# Patient Record
Sex: Female | Born: 1955 | Race: Black or African American | Hispanic: No | Smoking: Never smoker
Health system: Southern US, Community
[De-identification: ages and names within clinical notes are randomized; demographics above are authoritative.]

## PROBLEM LIST (undated history)

## (undated) DIAGNOSIS — R42 Dizziness and giddiness: Secondary | ICD-10-CM

## (undated) DIAGNOSIS — R51 Headache: Secondary | ICD-10-CM

## (undated) DIAGNOSIS — I639 Cerebral infarction, unspecified: Secondary | ICD-10-CM

## (undated) DIAGNOSIS — I10 Essential (primary) hypertension: Secondary | ICD-10-CM

## (undated) DIAGNOSIS — R519 Headache, unspecified: Secondary | ICD-10-CM

## (undated) DIAGNOSIS — M797 Fibromyalgia: Secondary | ICD-10-CM

## (undated) DIAGNOSIS — N200 Calculus of kidney: Secondary | ICD-10-CM

## (undated) DIAGNOSIS — G459 Transient cerebral ischemic attack, unspecified: Secondary | ICD-10-CM

## (undated) HISTORY — PX: KNEE ARTHROSCOPY: SUR90

## (undated) HISTORY — PX: ABDOMINAL HYSTERECTOMY: SHX81

---

## 2000-12-14 ENCOUNTER — Ambulatory Visit (HOSPITAL_COMMUNITY): Admission: RE | Admit: 2000-12-14 | Discharge: 2000-12-14 | Payer: Self-pay | Admitting: Obstetrics and Gynecology

## 2000-12-14 ENCOUNTER — Encounter: Payer: Self-pay | Admitting: Obstetrics and Gynecology

## 2001-05-18 ENCOUNTER — Encounter: Payer: Self-pay | Admitting: Obstetrics and Gynecology

## 2001-05-18 ENCOUNTER — Ambulatory Visit (HOSPITAL_COMMUNITY): Admission: RE | Admit: 2001-05-18 | Discharge: 2001-05-18 | Payer: Self-pay | Admitting: Obstetrics and Gynecology

## 2001-12-22 ENCOUNTER — Encounter: Payer: Self-pay | Admitting: Obstetrics and Gynecology

## 2001-12-22 ENCOUNTER — Ambulatory Visit (HOSPITAL_COMMUNITY): Admission: RE | Admit: 2001-12-22 | Discharge: 2001-12-22 | Payer: Self-pay | Admitting: Internal Medicine

## 2004-01-04 ENCOUNTER — Emergency Department (HOSPITAL_COMMUNITY): Admission: EM | Admit: 2004-01-04 | Discharge: 2004-01-04 | Payer: Self-pay | Admitting: Emergency Medicine

## 2004-04-18 ENCOUNTER — Ambulatory Visit (HOSPITAL_COMMUNITY): Admission: RE | Admit: 2004-04-18 | Discharge: 2004-04-18 | Payer: Self-pay | Admitting: Obstetrics and Gynecology

## 2005-04-20 ENCOUNTER — Ambulatory Visit (HOSPITAL_COMMUNITY): Admission: RE | Admit: 2005-04-20 | Discharge: 2005-04-20 | Payer: Self-pay | Admitting: Obstetrics and Gynecology

## 2005-05-21 ENCOUNTER — Emergency Department (HOSPITAL_COMMUNITY): Admission: EM | Admit: 2005-05-21 | Discharge: 2005-05-21 | Payer: Self-pay | Admitting: Emergency Medicine

## 2006-01-04 DIAGNOSIS — IMO0002 Reserved for concepts with insufficient information to code with codable children: Secondary | ICD-10-CM | POA: Insufficient documentation

## 2006-01-04 DIAGNOSIS — E669 Obesity, unspecified: Secondary | ICD-10-CM | POA: Insufficient documentation

## 2006-01-04 DIAGNOSIS — M51379 Other intervertebral disc degeneration, lumbosacral region without mention of lumbar back pain or lower extremity pain: Secondary | ICD-10-CM | POA: Insufficient documentation

## 2006-05-05 ENCOUNTER — Ambulatory Visit (HOSPITAL_COMMUNITY): Admission: RE | Admit: 2006-05-05 | Discharge: 2006-05-05 | Payer: Self-pay | Admitting: Obstetrics and Gynecology

## 2007-04-14 ENCOUNTER — Emergency Department (HOSPITAL_COMMUNITY): Admission: EM | Admit: 2007-04-14 | Discharge: 2007-04-14 | Payer: Self-pay | Admitting: Emergency Medicine

## 2007-05-23 ENCOUNTER — Ambulatory Visit (HOSPITAL_COMMUNITY): Admission: RE | Admit: 2007-05-23 | Discharge: 2007-05-23 | Payer: Self-pay | Admitting: Obstetrics and Gynecology

## 2007-06-05 ENCOUNTER — Emergency Department (HOSPITAL_COMMUNITY): Admission: EM | Admit: 2007-06-05 | Discharge: 2007-06-05 | Payer: Self-pay | Admitting: Emergency Medicine

## 2008-05-09 ENCOUNTER — Ambulatory Visit: Payer: Self-pay | Admitting: Internal Medicine

## 2008-05-23 ENCOUNTER — Ambulatory Visit (HOSPITAL_COMMUNITY): Admission: RE | Admit: 2008-05-23 | Discharge: 2008-05-23 | Payer: Self-pay | Admitting: Obstetrics and Gynecology

## 2008-05-28 ENCOUNTER — Encounter: Payer: Self-pay | Admitting: Internal Medicine

## 2008-05-28 ENCOUNTER — Ambulatory Visit (HOSPITAL_COMMUNITY): Admission: RE | Admit: 2008-05-28 | Discharge: 2008-05-28 | Payer: Self-pay | Admitting: Internal Medicine

## 2008-05-28 ENCOUNTER — Ambulatory Visit: Payer: Self-pay | Admitting: Internal Medicine

## 2008-10-30 ENCOUNTER — Encounter: Payer: Self-pay | Admitting: Orthopaedic Surgery

## 2008-11-01 ENCOUNTER — Encounter: Payer: Self-pay | Admitting: Orthopaedic Surgery

## 2008-12-01 ENCOUNTER — Encounter: Payer: Self-pay | Admitting: Orthopaedic Surgery

## 2009-05-24 ENCOUNTER — Ambulatory Visit (HOSPITAL_COMMUNITY): Admission: RE | Admit: 2009-05-24 | Discharge: 2009-05-24 | Payer: Self-pay | Admitting: Family Medicine

## 2009-11-30 ENCOUNTER — Emergency Department (HOSPITAL_COMMUNITY): Admission: EM | Admit: 2009-11-30 | Discharge: 2009-11-30 | Payer: Self-pay | Admitting: Emergency Medicine

## 2009-12-05 DIAGNOSIS — S838X9A Sprain of other specified parts of unspecified knee, initial encounter: Secondary | ICD-10-CM | POA: Insufficient documentation

## 2010-03-08 ENCOUNTER — Emergency Department (HOSPITAL_COMMUNITY): Admission: EM | Admit: 2010-03-08 | Discharge: 2010-03-08 | Payer: Self-pay | Admitting: Emergency Medicine

## 2010-03-24 ENCOUNTER — Ambulatory Visit (HOSPITAL_COMMUNITY): Admission: RE | Admit: 2010-03-24 | Discharge: 2010-03-24 | Payer: Self-pay | Admitting: Family Medicine

## 2010-03-30 ENCOUNTER — Emergency Department (HOSPITAL_COMMUNITY): Admission: EM | Admit: 2010-03-30 | Discharge: 2010-03-30 | Payer: Self-pay | Admitting: Emergency Medicine

## 2010-04-16 ENCOUNTER — Ambulatory Visit: Payer: Self-pay | Admitting: General Practice

## 2010-05-16 ENCOUNTER — Emergency Department (HOSPITAL_COMMUNITY): Admission: EM | Admit: 2010-05-16 | Discharge: 2010-05-16 | Payer: Self-pay | Admitting: Emergency Medicine

## 2010-06-05 ENCOUNTER — Ambulatory Visit (HOSPITAL_COMMUNITY): Admission: RE | Admit: 2010-06-05 | Discharge: 2010-06-05 | Payer: Self-pay | Admitting: Obstetrics and Gynecology

## 2010-10-15 LAB — COMPREHENSIVE METABOLIC PANEL
ALT: 22 U/L (ref 0–35)
Alkaline Phosphatase: 102 U/L (ref 39–117)
CO2: 28 mEq/L (ref 19–32)
Calcium: 9.3 mg/dL (ref 8.4–10.5)
GFR calc non Af Amer: >60 mL/min (ref >60)
Glucose, Bld: 93 mg/dL (ref 70–99)
Potassium: 3.8 mEq/L (ref 3.5–5.1)
Sodium: 140 mEq/L (ref 135–145)

## 2010-10-15 LAB — DIFFERENTIAL
Basophils Relative: 1 % (ref 0–1)
Eosinophils Absolute: 0.1 10*3/uL (ref 0.0–0.7)
Neutrophils Relative %: 72 % (ref 43–77)

## 2010-10-15 LAB — URINALYSIS, ROUTINE W REFLEX MICROSCOPIC
Glucose, UA: NEGATIVE mg/dL
Hgb urine dipstick: NEGATIVE
Ketones, ur: NEGATIVE mg/dL
pH: 7 (ref 5.0–8.0)

## 2010-10-15 LAB — CBC
HCT: 39.3 % (ref 36.0–46.0)
Hemoglobin: 12.9 g/dL (ref 12.0–15.0)
MCHC: 33 g/dL (ref 30.0–36.0)

## 2010-10-16 LAB — CBC
HCT: 38.2 % (ref 36.0–46.0)
MCHC: 32.8 g/dL (ref 30.0–36.0)
MCV: 79.1 fL (ref 78.0–100.0)
RDW: 15.3 % (ref 11.5–15.5)
WBC: 7.8 10*3/uL (ref 4.0–10.5)

## 2010-10-16 LAB — COMPREHENSIVE METABOLIC PANEL
Alkaline Phosphatase: 102 U/L (ref 39–117)
BUN: 8 mg/dL (ref 6–23)
GFR calc non Af Amer: >60 mL/min (ref >60)
Glucose, Bld: 108 mg/dL — ABNORMAL HIGH (ref 70–99)
Potassium: 3.9 mEq/L (ref 3.5–5.1)
Total Protein: 8.3 g/dL (ref 6.0–8.3)

## 2010-10-16 LAB — DIFFERENTIAL
Basophils Absolute: 0.1 10*3/uL (ref 0.0–0.1)
Basophils Relative: 1 % (ref 0–1)
Monocytes Relative: 8 % (ref 3–12)
Neutro Abs: 5.1 10*3/uL (ref 1.7–7.7)
Neutrophils Relative %: 66 % (ref 43–77)

## 2010-10-16 LAB — URINALYSIS, ROUTINE W REFLEX MICROSCOPIC
Protein, ur: NEGATIVE mg/dL
Urobilinogen, UA: 0.2 mg/dL (ref 0.0–1.0)

## 2010-10-16 LAB — URINE CULTURE: Culture  Setup Time: 201108282124

## 2010-10-16 LAB — URINE MICROSCOPIC-ADD ON

## 2010-10-17 LAB — URINALYSIS, ROUTINE W REFLEX MICROSCOPIC
Bilirubin Urine: NEGATIVE
Glucose, UA: NEGATIVE mg/dL
Hgb urine dipstick: NEGATIVE
Ketones, ur: NEGATIVE mg/dL
pH: 6 (ref 5.0–8.0)

## 2010-10-17 LAB — URINE CULTURE: Colony Count: 65000

## 2010-10-17 LAB — URINE MICROSCOPIC-ADD ON

## 2010-12-16 NOTE — Op Note (Signed)
Cathy Thomas, Cathy Thomas                ACCOUNT NO.:  [id number]   MEDICAL RECORD NO.:  [id number]          PATIENT TYPE:  AMB   LOCATION:  DAY                           FACILITY:  [facility]   PHYSICIAN:  R. Roetta Sessions, M.D. DATE OF BIRTH:  Mar 07, 1956   DATE OF PROCEDURE:  05/28/2008  DATE OF DISCHARGE:                               OPERATIVE REPORT   PROCEDURE:  Diagnostic ileocolonoscopy and colonic biopsy.   INDICATIONS FOR PROCEDURE:  A 55 year old lady with a lifelong history  of chronic constipation.  She has never had her lower GI tract imaged.  We saw her in the office on May 09, 2008, started on some Colace 100  mg orally twice daily and MiraLax 17 g orally nightly.  She tells me  today that she has done much better as far as still improved bowel  function and concern.  There is no family history of colorectal  neoplasia.  Colonoscopy is now being done.  Risks, benefits,  alternatives, and limitations have been reviewed, questions answered.  She is agreeable.  Please see the documentation in the medical record.   PROCEDURE NOTE:  O2 saturation, blood pressure, pulse and respiration  were monitored throughout the entire procedure.  Conscious sedation,  Versed 3 mg IV and Demerol 75 mg IV in divided doses.   INSTRUMENT:  Pentax video chip system.   FINDINGS:  Digital rectal exam revealed no abnormalities.  Endoscopic  findings:  Prep was adequate.  Colon:  Colonic mucosa was surveyed from  the rectosigmoid junction through the left, transverse and right colon  to area of the appendiceal orifice, ileocecal valve, and cecum.  These  structures were well seen and photographed for the record.  From this  level, the scope was slowly withdrawn.  All previously-mentioned mucosal  surfaces were again seen.  The terminal ileum was also intubated to 10  cm.  There was a single diminutive polyp in the mid descending colon  which was cold biopsied/removed.  The remainder of the colonic  mucosa  and the terminal ileum mucosa appeared normal.  The scope was pulled  down the rectum.  The rectal vault was small.  I attempted to retroflex  but was unable to do so, but for the same reason I was able see the  rectal mucosa en face very well and it appeared normal.  The patient  tolerated the procedure well and was reacted in Endoscopy.   IMPRESSION:  Normal rectum, diminutive mid descending colon polyp status  post cold biopsy.  The remainder of the colonic mucosa and terminal  ileal mucosa appeared normal.   RECOMMENDATIONS:  1. Continue Colace 100 mg orally twice a daily.  Continue MiraLax 17 g      orally at bedtime p.r.n. constipation.  2. Follow up on path.  3. Further recommendations to follow.      Jonathon Bellows, M.D.  Electronically Signed     RMR/MEDQ  D:  05/28/2008  T:  05/28/2008  Job:  [id number]   cc:   Tilda Burrow, M.D.  Fax: [phone]

## 2010-12-16 NOTE — Consult Note (Signed)
NAMECANDID, BOVEY                ACCOUNT NO.:  [id number]   MEDICAL RECORD NO.:  [id number]          PATIENT TYPE:  AMB   LOCATION:  DAY                           FACILITY:  [facility]   PHYSICIAN:  R. Roetta Sessions, M.D. DATE OF BIRTH:  02-18-1956   DATE OF CONSULTATION:  DATE OF DISCHARGE:                                 CONSULTATION   REASON FOR CONSULTATION:  Needs colonoscopy/chronic constipation.   HISTORY OF PRESENT ILLNESS:  Ms. Allean Found is a 55 year old African American  female with a lifelong history of chronic constipation.  She can go up  to 1 week without a bowel movement.  More recently she has been regular,  but occasionally having some hard stools.  She has taken over-the-  counter Correctol at times, as well as her laxatives.  She denies any  enemas.  She denies any abdominal pain, rectal bleeding, or melena.  She  denies any anorexia or early satiety.  Her weight has remained stable.  She denies any history of diarrhea, nausea, vomiting, heartburn, or  indigestion.   PAST MEDICAL AND SURGICAL HISTORY:  Fibromyalgia and hypertension.  She  is status post tonsillectomy, complete hysterectomy, 2 knee surgeries, C-  section, and foot surgery.   CURRENT MEDICATIONS:  1. Lyrica 150 mg b.i.d.  2. Procardia 60 mg daily.   ALLERGIES:  No known drug allergies.   FAMILY HISTORY:  There is no known family history of colorectal  carcinoma, liver, or chronic GI problems.  Father deceased at 57  secondary to prostate carcinoma and mother at 55 secondary to uterine  carcinoma.  She has 1 healthy brother.   SOCIAL HISTORY:  As above, has been married twice.  She is currently  divorced.  Lives with one of her sons, there age is 36 and 59.  Both are  healthy.  She has been disabled for the last 5 years secondary to  fibromyalgia, prior to that she worked at [employer].  She denies any  tobacco, alcohol, or drug use.   REVIEW OF SYSTEMS:  See HPI, otherwise negative.   PHYSICAL  EXAMINATION:  VITAL SIGNS:  Weight 274 pounds, height 57  inches, temperature 97.9, blood pressure 118/82, and pulse 60.  GENERAL:  She is a well-developed, obese [place] American female who is  alert, oriented, pleasant, and cooperative in no acute distress.  HEENT:  Clear.  Sclerae clear, nonicteric. Conjunctivae pink. Oropharynx  pink and moist.  She has postnasal drip in the posterior pharynx.  NECK:  Supple without mass or thyromegaly.  CHEST:  Heart regular rate and rhythm.  Normal S1 and S2 without  murmurs, clicks, rubs, or gallops.  LUNGS:  Clear to auscultation bilaterally.  ABDOMEN:  Protuberant.  Positive bowel sounds x4.  No bruits  auscultated.  Soft, nontender, and nondistended without palpable mass or  hepatosplenomegaly.  No rebound tenderness or guarding.  EXTREMITIES:  With 1+ nonpitting bilateral lower extremity/ankle edema.   IMPRESSION:  Ms. Allean Found is a 55 year old African American female with a  lifelong history of chronic constipation.  She has never had a screening  colonoscopy.  PLAN:  1. I would suggest an addition of Colace 100 mg stool softeners once      or twice daily as needed for constipation.  2. MiraLax 17 g daily in 8 ounces of liquid p.r.n. for constipation.  3. Screening colonoscopy with Dr. Jena Gauss in the near future.  I      discussed the procedure including the risks and benefits which      include, but not limited to bleeding, infection, perforation, and      drug reaction.  She agrees.  Informed consent will be obtained.   Thank you Dr. Emelda Fear for allowing [place] to participate in the care of Ms.  Bowe.      Lorenza Burton, N.P.      Jonathon Bellows, M.D.  Electronically Signed    KJ/MEDQ  D:  05/09/2008  T:  05/09/2008  Job:  [id number]   cc:   Tilda Burrow, M.D.  Fax: [phone]

## 2011-05-11 ENCOUNTER — Other Ambulatory Visit: Payer: Self-pay | Admitting: Obstetrics and Gynecology

## 2011-05-11 DIAGNOSIS — Z139 Encounter for screening, unspecified: Secondary | ICD-10-CM

## 2011-05-12 LAB — DIFFERENTIAL
Eosinophils Relative: 2
Lymphocytes Relative: 34
Lymphs Abs: 3.1
Monocytes Absolute: 0.8 — ABNORMAL HIGH
Monocytes Relative: 8
Neutro Abs: 5.1

## 2011-05-12 LAB — POCT CARDIAC MARKERS
CKMB, poc: <1 — ABNORMAL LOW
Myoglobin, poc: 47.5
Operator id: 166561
Troponin i, poc: <0.05

## 2011-05-12 LAB — COMPREHENSIVE METABOLIC PANEL
AST: 17
Albumin: 3.2 — ABNORMAL LOW
Calcium: 8.8
Chloride: 105
Creatinine, Ser: 0.68
GFR calc Af Amer: >60
Total Protein: 6.8

## 2011-05-12 LAB — CBC
MCHC: 33.1
MCV: 77.5 — ABNORMAL LOW
Platelets: 319
RDW: 15.4 — ABNORMAL HIGH
WBC: 9.2

## 2011-05-15 LAB — DIFFERENTIAL
Basophils Absolute: 0
Eosinophils Relative: 2
Lymphocytes Relative: 20
Monocytes Absolute: 1 — ABNORMAL HIGH
Monocytes Relative: 10
Neutro Abs: 6.7

## 2011-05-15 LAB — CBC
HCT: 37.9
Hemoglobin: 12.4
WBC: 9.8

## 2011-05-15 LAB — COMPREHENSIVE METABOLIC PANEL
AST: 20
Albumin: 3.4 — ABNORMAL LOW
Alkaline Phosphatase: 104
BUN: 10
Chloride: 109
Creatinine, Ser: 0.56
GFR calc Af Amer: >60
Potassium: 4.1
Total Bilirubin: 0.7
Total Protein: 7.2

## 2011-05-15 LAB — CK TOTAL AND CKMB (NOT AT ARMC)
CK, MB: 0.8
Total CK: 74

## 2011-06-09 ENCOUNTER — Inpatient Hospital Stay (HOSPITAL_COMMUNITY): Admission: RE | Admit: 2011-06-09 | Payer: Self-pay | Source: Ambulatory Visit

## 2011-06-11 ENCOUNTER — Ambulatory Visit (HOSPITAL_COMMUNITY)
Admission: RE | Admit: 2011-06-11 | Discharge: 2011-06-11 | Disposition: A | Discharge status: Home / Self Care | Payer: Medicare PPO | Source: Ambulatory Visit | Attending: Obstetrics and Gynecology | Admitting: Obstetrics and Gynecology

## 2011-06-11 DIAGNOSIS — Z1231 Encounter for screening mammogram for malignant neoplasm of breast: Secondary | ICD-10-CM | POA: Insufficient documentation

## 2011-06-11 DIAGNOSIS — Z139 Encounter for screening, unspecified: Secondary | ICD-10-CM

## 2011-12-04 ENCOUNTER — Emergency Department (HOSPITAL_COMMUNITY)
Admission: EM | Admit: 2011-12-04 | Discharge: 2011-12-04 | Disposition: A | Discharge status: Home / Self Care | Payer: Medicare PPO | Attending: Emergency Medicine | Admitting: Emergency Medicine

## 2011-12-04 ENCOUNTER — Encounter (HOSPITAL_COMMUNITY): Payer: Self-pay | Admitting: Emergency Medicine

## 2011-12-04 ENCOUNTER — Emergency Department (HOSPITAL_COMMUNITY): Payer: Medicare PPO

## 2011-12-04 DIAGNOSIS — R109 Unspecified abdominal pain: Secondary | ICD-10-CM | POA: Insufficient documentation

## 2011-12-04 DIAGNOSIS — Z79899 Other long term (current) drug therapy: Secondary | ICD-10-CM | POA: Insufficient documentation

## 2011-12-04 DIAGNOSIS — N209 Urinary calculus, unspecified: Secondary | ICD-10-CM | POA: Insufficient documentation

## 2011-12-04 DIAGNOSIS — R350 Frequency of micturition: Secondary | ICD-10-CM | POA: Insufficient documentation

## 2011-12-04 DIAGNOSIS — R10812 Left upper quadrant abdominal tenderness: Secondary | ICD-10-CM | POA: Insufficient documentation

## 2011-12-04 DIAGNOSIS — R300 Dysuria: Secondary | ICD-10-CM | POA: Insufficient documentation

## 2011-12-04 DIAGNOSIS — IMO0001 Reserved for inherently not codable concepts without codable children: Secondary | ICD-10-CM | POA: Insufficient documentation

## 2011-12-04 DIAGNOSIS — N23 Unspecified renal colic: Secondary | ICD-10-CM | POA: Insufficient documentation

## 2011-12-04 HISTORY — DX: Calculus of kidney: N20.0

## 2011-12-04 HISTORY — DX: Fibromyalgia: M79.7

## 2011-12-04 LAB — URINALYSIS, ROUTINE W REFLEX MICROSCOPIC
Bilirubin Urine: NEGATIVE
Glucose, UA: NEGATIVE mg/dL
Specific Gravity, Urine: 1.015 (ref 1.005–1.030)

## 2011-12-04 LAB — URINE MICROSCOPIC-ADD ON

## 2011-12-04 MED ORDER — ONDANSETRON 4 MG PO TBDP
ORAL_TABLET | ORAL | Status: AC
  Administered 2011-12-04: 4 mg
  Filled 2011-12-04: qty 1

## 2011-12-04 MED ORDER — ONDANSETRON 8 MG PO TBDP
8.0000 mg | ORAL_TABLET | Freq: Once | ORAL | Status: AC
  Administered 2011-12-04: 8 mg via ORAL
  Filled 2011-12-04: qty 1

## 2011-12-04 MED ORDER — HYDROMORPHONE HCL PF 2 MG/ML IJ SOLN
2.0000 mg | Freq: Once | INTRAMUSCULAR | Status: AC
  Administered 2011-12-04: 2 mg via INTRAMUSCULAR
  Filled 2011-12-04: qty 1

## 2011-12-04 MED ORDER — OXYCODONE-ACETAMINOPHEN 5-325 MG PO TABS: 1.0000 | ORAL_TABLET | ORAL | Status: AC | PRN

## 2011-12-04 NOTE — Discharge Instructions (Signed)
Kidney Stones Kidney stones (ureteral lithiasis) are solid masses that form inside your kidneys. The intense pain is caused by the stone moving through the kidney, ureter, bladder, and urethra (urinary tract). When the stone moves, the ureter starts to spasm around the stone. The stone is usually passed in the urine.  HOME CARE  Drink enough fluids to keep your pee (urine) clear or pale yellow. This helps to get the stone out.   Strain all pee through the provided strainer. Do not pee without peeing through the strainer, not even once. If you pee the stone out, catch it. The stone may be as small as a grain of salt. Take this to your doctor.   Only take medicine as told by your doctor.   Follow up with your doctor as told.   Get follow-up X-rays as told by your doctor.  GET HELP RIGHT AWAY IF:   Your pain does not get better with medicine.   You have a fever.   Your pain increases and gets worse over 18 hours.   You have new belly (abdominal) pain.   You feel faint or pass out.  MAKE SURE YOU:   Understand these instructions.   Will watch your condition.   Will get help right away if you are not doing well or get worse.  Document Released: 01/06/2008 Document Revised: 07/09/2011 Document Reviewed: 05/17/2009 [facility] Patient Information 2012 [place], [place].Ureteral Colic Ureteral colic is spasm-like pain from the kidney or the ureter. This is often caused by a kidney stone. The pain is caused by the stone trying to get through the tubes that pass your pee. HOME CARE   Drink enough fluids to keep your pee (urine) clear or pale yellow.   Strain all your pee. A strainer will be provided. Keep anything caught in the strainer and bring it to your doctor. The stone causing the pain may be very small.   Only take medicine as told by your doctor.   Follow up with your doctor as told.  GET HELP RIGHT AWAY IF:   Pain is not controlled with medicine.   Pain continues or gets  worse.   The pain changes and there is chest or belly (abdominal) pain.   You pass out (faint).   You cannot pee.   You keep throwing up (vomiting).   You have a temperature by mouth above 102 F (38.9 C), not controlled by medicine.  MAKE SURE YOU:   Understand these instructions.   Will watch this condition.   Will get help right away if you are not doing well or get worse.  Document Released: 01/06/2008 Document Revised: 07/09/2011 Document Reviewed: 01/06/2008 [facility] Patient Information 2012 [place], [place].

## 2011-12-04 NOTE — ED Notes (Signed)
Pt returned from ct very nauseated, small ammt of vomitus noted, EDP notified and advises more zofran can be given

## 2011-12-04 NOTE — ED Notes (Signed)
Patient c/o left flank pain; states she had same pains a few weeks ago and thought maybe she had passed a stone.

## 2011-12-04 NOTE — ED Provider Notes (Signed)
History     CSN: [id number]  Arrival date & time 12/04/11  0400   First MD Initiated Contact with Patient 12/04/11 [phone]      Chief Complaint  Patient presents with  . Flank Pain    (Consider location/radiation/quality/duration/timing/severity/associated sxs/prior treatment) HPI Comments: Cathy Thomas is a 56 y.o. Female who complains of pain in left upper abdomen for several hours. She had similar pain 3 weeks ago, it resolved spontaneously. She has had pain like this before when passing. The stone. She has had some urinary symptoms with dysuria and frequency, but no evident, hematuria. She denies fever, chills, nausea, vomiting, diarrhea. No medication was tried tonight.  The history is provided by the patient.    Past Medical History  Diagnosis Date  . Kidney stone   . Fibromyalgia     Past Surgical History  Procedure Date  . Abdominal hysterectomy   . Knee arthroscopy     No family history on file.  History  Substance Use Topics  . Smoking status: Never Smoker   . Smokeless tobacco: Not on file  . Alcohol Use: Yes     occ    OB History    Grav Para Term Preterm Abortions TAB SAB Ect Mult Living                  Review of Systems  All other systems reviewed and are negative.    Allergies  Amoxicillin  Home Medications   Current Outpatient Rx  Name Route Sig Dispense Refill  . MAGNESIUM GLUCONATE 500 MG PO TABS Oral Take 500 mg by mouth 2 (two) times daily.    Marland Kitchen PREGABALIN 75 MG PO CAPS Oral Take 75 mg by mouth 2 (two) times daily.    Marland Kitchen VITAMIN D (ERGOCALCIFEROL) 50000 UNITS PO CAPS Oral Take 50,000 Units by mouth 2 (two) times a week.    . OXYCODONE-ACETAMINOPHEN 5-325 MG PO TABS Oral Take 1 tablet by mouth every 4 (four) hours as needed for pain. 20 tablet 0    BP 143/125  Pulse 83  Temp(Src) 97.6 F (36.4 C) (Oral)  Resp 20  Ht 5\' 7"  (1.702 m)  Wt 270 lb (122.471 kg)  BMI 42.29 kg/m2  SpO2 100%  Physical Exam  Nursing note and vitals  reviewed. Constitutional: She is oriented to person, place, and time. She appears well-developed and well-nourished.  HENT:  Head: Normocephalic and atraumatic.  Eyes: Conjunctivae and EOM are normal. Pupils are equal, round, and reactive to light.  Neck: Normal range of motion and phonation normal. Neck supple.  Cardiovascular: Normal rate, regular rhythm and intact distal pulses.   Pulmonary/Chest: Effort normal and breath sounds normal. She exhibits no tenderness.  Abdominal: Soft. She exhibits no distension and no mass. There is tenderness (mild left upper quadrant tenderness). There is no rebound and no guarding.  Musculoskeletal: Normal range of motion.  Neurological: She is alert and oriented to person, place, and time. She has normal strength. She exhibits normal muscle tone.       Normal gait.  Skin: Skin is warm and dry.  Psychiatric: She has a normal mood and affect. Her behavior is normal. Judgment and thought content normal.    ED Course  Procedures (including critical care time)  Emergency department treatment: Dilaudid IM, Zofran, oral dissolving tablet, repeated.  6:16 AM Reevaluation with update and discussion. After initial assessment and treatment, an updated evaluation reveals pain resolved. Akito Boomhower L      Labs  Reviewed  URINALYSIS, ROUTINE W REFLEX MICROSCOPIC - Abnormal; Notable for the following:    APPearance CLOUDY (*)    Hgb urine dipstick LARGE (*)    Leukocytes, UA MODERATE (*)    All other components within normal limits  URINE MICROSCOPIC-ADD ON - Abnormal; Notable for the following:    Squamous Epithelial / LPF MANY (*)    Bacteria, UA MANY (*)    All other components within normal limits  URINE CULTURE   Ct Abdomen Pelvis Wo Contrast  12/04/2011  *RADIOLOGY REPORT*  Clinical Data: Left flank pain  CT ABDOMEN AND PELVIS WITHOUT CONTRAST  Technique:  Multidetector CT imaging of the abdomen and pelvis was performed following the standard  protocol without intravenous contrast.  Comparison: 03/24/2010  Findings: Limited images through the lung bases demonstrate no significant appreciable abnormality. The heart size is within normal limits. No pleural or pericardial effusion.  Intra-abdominal organ evaluation is limited without intravenous contrast.  Within this limitation, unremarkable liver, pancreas, and adrenal glands.  Hypodensity along the medial margin of the spleen is unchanged.  Three additional hypodense areas are also unchanged.  Cholelithiasis.  No gallbladder wall thickening or pericholecystic fluid.  No biliary ductal dilatation.  The right kidney is normal in size, with no hydronephrosis or hydroureter.  The left kidney is edematous with perinephric fat stranding and moderate hydroureteronephrosis to the level of a 5 mm left UVJ stone.  No bowel obstruction.  No CT evidence for colitis.  Normal appendix.  No free intraperitoneal air or fluid.  No lymphadenopathy.  Normal caliber vasculature.  Small hiatal hernia.  Tiny fat containing umbilical hernia.  Partially decompressed bladder.  Absent uterus.  No adnexal mass.  Left SI joint ankylosis.  Multilevel degenerative changes of the thoracolumbar spine.  No acute osseous abnormality identified.  IMPRESSION: Left renal edema and perinephric fat stranding.  Moderate hydroureteronephrosis to the level of a 5 mm left UVJ stone.  Cholelithiasis.  Unchanged appearance to the splenic hypodensities, nonspecific and incompletely characterized on a noncontrast examination.   See previous report for recommendations.  Original Report Authenticated By: Waneta Martins, M.D.     1. Urolithiasis   2. Ureteral colic       MDM  Distal ureteral stone, highly likely to pass within 3 days. No apparent urinary tract infection. Patient is improved with treatment in the ED in stable for discharge. Doubt uropathy, urinary tract infection, metabolic instability, or serious bacterial  infection.   Plan: Home Medications- usual and Percocet; Home Treatments- strain urine; Recommended follow up- Urology in 1 week        Flint Melter, MD 12/04/11 [phone]

## 2011-12-06 LAB — URINE CULTURE: Colony Count: NO GROWTH

## 2011-12-15 NOTE — H&P (Signed)
Cathy Thomas, Cathy Thomas                ACCOUNT NO.:  [id number]  MEDICAL RECORD NO.:  [id number]  LOCATION:  APA19                         FACILITY:  [facility]  PHYSICIAN:  Ky Barban, M.D.DATE OF BIRTH:  1956/01/11  DATE OF ADMISSION:  12/04/2011 DATE OF DISCHARGE:  05/03/2013LH                             HISTORY & PHYSICAL   CHIEF COMPLAINT:  Recurrent left renal colic since Dec 04, 2011.  She has been to the emergency room on that day with severe pain.  A CT scan showed there was a 5 mm stone in the left ureterovesical junction causing partial obstruction.  She has been trying to pass the stone ever since but it is not coming out.  She wants something done.  She is sick of waiting and hurting.  No fever, chills, or gross hematuria.  PAST MEDICAL HISTORY:  She has history of having passed 2 kidney stones in the last couple of years.  The last stone she passed was about 3 weeks before this happened on May 3.  She has no diabetes.  She has history of having hypertension, but she does not have that anymore.  She does not take any medicine.  She also suffers from fibromyalgia.  She had bilateral knee arthroscopic surgery about a year ago and hysterectomy in 2002.  She had bilateral knee arthroscopic surgery in 2003.  She had hysterectomy in 2002 for fibroid tumors, tubal ligation in 1993, C-section in 1991, tonsillectomy as a child.  FAMILY HISTORY:  Father has kidney stones.  PERSONAL HISTORY:  She does not smoke or drink.  REVIEW OF SYSTEMS:  Unremarkable.  PHYSICAL EXAMINATION:  GENERAL:  A well-developed female not in acute distress, fully conscious, alert, oriented. VITAL SIGNS:  Blood pressure 137/75, temperature is 98.2. CENTRAL NERVOUS SYSTEM:  No gross neurological deficit. HEAD, NECK, ENT:  Negative. CHEST:  Symmetrical. HEART:  Regular sinus rhythm. ABDOMEN:  Soft, flat.  Liver, spleen, kidneys not palpable.  No CVA tenderness. PELVIC:  She is tender on the left  side.  No adnexal mass.  IMPRESSION:  Left distal ureteral calculus.  PLANS:  Cystoscopy, left retrograde pyelogram, ureteroscopic stone basket extraction, holmium laser lithotripsy, insertion of double-J stent.  I told her that if she wants to wait, she can wait.  She is not having any pain but she does not want to wait and want me to go ahead and remove the stone.  So after discussing various treatment options, which include ESL versus stone basket, I recommended stone basket, and she will come tomorrow.  We will do cystoscopy, retrograde pyelogram on the left side, ureteroscopic stone basket extraction, holmium laser lithotripsy, and insertion of double-J stent under anesthesia as outpatient.  I discussed in detail.  Procedure is limitation, complication, especially ureteral perforation leading to open surgery and stones migration.     Ky Barban, M.D.     MIJ/MEDQ  D:  12/15/2011  T:  12/15/2011  Job:  [id number]

## 2011-12-16 ENCOUNTER — Encounter (HOSPITAL_COMMUNITY): Payer: Self-pay | Admitting: Anesthesiology

## 2011-12-16 ENCOUNTER — Encounter (HOSPITAL_COMMUNITY): Payer: Self-pay | Admitting: *Deleted

## 2011-12-16 ENCOUNTER — Other Ambulatory Visit: Payer: Self-pay

## 2011-12-16 ENCOUNTER — Encounter (HOSPITAL_COMMUNITY)
Admission: RE | Disposition: A | Discharge status: Home / Self Care | Payer: Self-pay | Source: Ambulatory Visit | Attending: Urology

## 2011-12-16 ENCOUNTER — Ambulatory Visit (HOSPITAL_COMMUNITY): Payer: Medicare PPO

## 2011-12-16 ENCOUNTER — Ambulatory Visit (HOSPITAL_COMMUNITY): Payer: Medicare PPO | Admitting: Anesthesiology

## 2011-12-16 ENCOUNTER — Ambulatory Visit (HOSPITAL_COMMUNITY)
Admission: RE | Admit: 2011-12-16 | Discharge: 2011-12-16 | Disposition: A | Discharge status: Home / Self Care | Payer: Medicare PPO | Source: Ambulatory Visit | Attending: Urology | Admitting: Urology

## 2011-12-16 DIAGNOSIS — N201 Calculus of ureter: Secondary | ICD-10-CM | POA: Insufficient documentation

## 2011-12-16 DIAGNOSIS — Z01812 Encounter for preprocedural laboratory examination: Secondary | ICD-10-CM | POA: Insufficient documentation

## 2011-12-16 HISTORY — DX: Cerebral infarction, unspecified: I63.9

## 2011-12-16 HISTORY — PX: BALLOON DILATION: SHX5330

## 2011-12-16 HISTORY — PX: STONE EXTRACTION WITH BASKET: SHX5318

## 2011-12-16 LAB — HEMOGLOBIN AND HEMATOCRIT, BLOOD
HCT: 36.9 % (ref 36.0–46.0)
Hemoglobin: 11.8 g/dL — ABNORMAL LOW (ref 12.0–15.0)

## 2011-12-16 LAB — BASIC METABOLIC PANEL
CO2: 27 mEq/L (ref 19–32)
Chloride: 107 mEq/L (ref 96–112)
Creatinine, Ser: 0.59 mg/dL (ref 0.50–1.10)
GFR calc Af Amer: >90 mL/min (ref >90)
Potassium: 3.7 mEq/L (ref 3.5–5.1)
Sodium: 142 mEq/L (ref 135–145)

## 2011-12-16 SURGERY — ERCP, WITH LITHROTRIPSY OR REMOVAL OF COMMON BILE DUCT CALCULUS USING BASKET
Anesthesia: General | Site: Ureter | Laterality: Left | Wound class: Clean Contaminated

## 2011-12-16 MED ORDER — FENTANYL CITRATE 0.05 MG/ML IJ SOLN
INTRAMUSCULAR | Status: DC | PRN
  Administered 2011-12-16 (×2): 25 ug via INTRAVENOUS
  Administered 2011-12-16: 50 ug via INTRAVENOUS
  Administered 2011-12-16: 25 ug via INTRAVENOUS
  Administered 2011-12-16: 50 ug via INTRAVENOUS

## 2011-12-16 MED ORDER — SODIUM CHLORIDE 0.9 % IR SOLN
Status: DC | PRN
  Administered 2011-12-16 (×2): 3000 mL

## 2011-12-16 MED ORDER — MIDAZOLAM HCL 2 MG/2ML IJ SOLN
INTRAMUSCULAR | Status: AC
  Filled 2011-12-16: qty 2

## 2011-12-16 MED ORDER — IOHEXOL 350 MG/ML SOLN
INTRAVENOUS | Status: DC | PRN
  Administered 2011-12-16: 50 mL

## 2011-12-16 MED ORDER — LACTATED RINGERS IV SOLN
INTRAVENOUS | Status: DC
  Administered 2011-12-16 (×2): via INTRAVENOUS

## 2011-12-16 MED ORDER — LIDOCAINE HCL 1 % IJ SOLN
INTRAMUSCULAR | Status: DC | PRN
  Administered 2011-12-16: 30 mg via INTRADERMAL

## 2011-12-16 MED ORDER — LIDOCAINE HCL (PF) 1 % IJ SOLN
INTRAMUSCULAR | Status: AC
  Filled 2011-12-16: qty 5

## 2011-12-16 MED ORDER — FENTANYL CITRATE 0.05 MG/ML IJ SOLN
INTRAMUSCULAR | Status: AC
  Filled 2011-12-16: qty 2

## 2011-12-16 MED ORDER — ONDANSETRON HCL 4 MG/2ML IJ SOLN: 4.0000 mg | Freq: Once | INTRAMUSCULAR | Status: DC | PRN

## 2011-12-16 MED ORDER — OXYCODONE-ACETAMINOPHEN 7.5-325 MG PO TABS: 1.0000 | ORAL_TABLET | Freq: Four times a day (QID) | ORAL | Status: AC | PRN

## 2011-12-16 MED ORDER — PROPOFOL 10 MG/ML IV EMUL
INTRAVENOUS | Status: AC
  Filled 2011-12-16: qty 20

## 2011-12-16 MED ORDER — PROPOFOL 10 MG/ML IV BOLUS
INTRAVENOUS | Status: DC | PRN
  Administered 2011-12-16: 25 mg via INTRAVENOUS
  Administered 2011-12-16: 175 mg via INTRAVENOUS

## 2011-12-16 MED ORDER — MIDAZOLAM HCL 5 MG/5ML IJ SOLN
INTRAMUSCULAR | Status: DC | PRN
  Administered 2011-12-16: 2 mg via INTRAVENOUS

## 2011-12-16 MED ORDER — MIDAZOLAM HCL 2 MG/2ML IJ SOLN
1.0000 mg | INTRAMUSCULAR | Status: DC | PRN
  Administered 2011-12-16: 2 mg via INTRAVENOUS

## 2011-12-16 MED ORDER — STERILE WATER FOR IRRIGATION IR SOLN
Status: DC | PRN
  Administered 2011-12-16: 1000 mL

## 2011-12-16 MED ORDER — FENTANYL CITRATE 0.05 MG/ML IJ SOLN: 25.0000 ug | INTRAMUSCULAR | Status: DC | PRN

## 2011-12-16 SURGICAL SUPPLY — 25 items
BAG DRAIN URO TABLE W/ADPT NS (DRAPE) ×3 IMPLANT
BAG DRN 8 ADPR NS SKTRN CSTL (DRAPE) ×2
CATH 5 FR WEDGE TIP (UROLOGICAL SUPPLIES) ×3 IMPLANT
CATH OPEN TIP 5FR (CATHETERS) ×3 IMPLANT
CATH OPEN TIP 6FR (CATHETERS) ×2 IMPLANT
CLOTH BEACON ORANGE TIMEOUT ST (SAFETY) ×3 IMPLANT
DILATOR UROMAX ULTRA (MISCELLANEOUS) ×2 IMPLANT
GLOVE BIO SURGEON STRL SZ7 (GLOVE) ×3 IMPLANT
GLOVE BIOGEL PI IND STRL 7.0 (GLOVE) ×2 IMPLANT
GLOVE BIOGEL PI INDICATOR 7.0 (GLOVE) ×2
GLOVE ECLIPSE 7.0 STRL STRAW (GLOVE) ×2 IMPLANT
GOWN STRL REIN XL XLG (GOWN DISPOSABLE) ×3 IMPLANT
GUIDEWIRE ANG ZIPWIRE 038X150 (WIRE) ×2 IMPLANT
IV NS IRRIG 3000ML ARTHROMATIC (IV SOLUTION) ×8 IMPLANT
KIT ROOM TURNOVER AP CYSTO (KITS) ×3 IMPLANT
LASER FIBER DISP (UROLOGICAL SUPPLIES) IMPLANT
LASER FIBER DISP 1000U (UROLOGICAL SUPPLIES) IMPLANT
MANIFOLD NEPTUNE II (INSTRUMENTS) ×3 IMPLANT
PACK CYSTO (CUSTOM PROCEDURE TRAY) ×3 IMPLANT
PAD ARMBOARD 7.5X6 YLW CONV (MISCELLANEOUS) ×3 IMPLANT
STENT PERCUFLEX 4.8FRX24 (STENTS) ×2 IMPLANT
STONE RETRIEVAL GEMINI 2.4 FR (MISCELLANEOUS) IMPLANT
SYRINGE 10CC LL (SYRINGE) ×2 IMPLANT
TOWEL OR 17X26 4PK STRL BLUE (TOWEL DISPOSABLE) ×3 IMPLANT
WIRE GUIDE BENTSON .035 15CM (WIRE) ×3 IMPLANT

## 2011-12-16 NOTE — Discharge Instructions (Signed)
Follow up appointment 12/21/2011  At 11:30am    Cystoscopy (Bladder Exam) Care After Refer to this sheet in the next few weeks. These discharge instructions provide you with general information on caring for yourself after you leave the hospital. Your caregiver may also give you specific instructions. Your treatment has been planned according to the most current medical practices available, but unavoidable complications sometimes occur. If you have any problems or questions after discharge, please call your caregiver. AFTER THE PROCEDURE   There may be temporary bleeding and burning with urination.   Drink enough water and fluids to keep your urine clear or pale yellow.  FINDING OUT THE RESULTS OF YOUR TEST Not all test results are available during your visit. If your test results are not back during the visit, make an appointment with your caregiver to find out the results. Do not assume everything is normal if you have not heard from your caregiver or the medical facility. It is important for you to follow up on all of your test results. SEEK IMMEDIATE MEDICAL CARE IF:   There is an increase in blood in the urine or you are passing clots.   There is difficulty passing urine.   You develop the chills.   You have an oral temperature above 102 F (38.9 C), not controlled by medicine.   Belly (abdominal) pain develops.  Document Released: 02/06/2005 Document Revised: 07/09/2011 Document Reviewed: 12/05/2007 [facility] Patient Information 2012 [place], [place].  Instructions Following General Anesthetic, Adult A nurse specialized in giving anesthesia (anesthetist) or a doctor specialized in giving anesthesia (anesthesiologist) gave you a medicine that made you sleep while a procedure was performed. For as long as 24 hours following this procedure, you may feel:  Dizzy.   Weak.   Drowsy.  AFTER THE PROCEDURE After surgery, you will be taken to the recovery area where a nurse will  monitor your progress. You will be allowed to go home when you are awake, stable, taking fluids well, and without complications. For the first 24 hours following an anesthetic:  Have a responsible person with you.   Do not drive a car. If you are alone, do not take public transportation.   Do not drink alcohol.   Do not take medicine that has not been prescribed by your caregiver.   Do not sign important papers or make important decisions.   You may resume normal diet and activities as directed.   Change bandages (dressings) as directed.   Only take over-the-counter or prescription medicines for pain, discomfort, or fever as directed by your caregiver.  If you have questions or problems that seem related to the anesthetic, call the hospital and ask for the anesthetist or anesthesiologist on call. SEEK IMMEDIATE MEDICAL CARE IF:   You develop a rash.   You have difficulty breathing.   You have chest pain.   You develop any allergic problems.  Document Released: 10/26/2000 Document Revised: 07/09/2011 Document Reviewed: 06/06/2007 [employer] Patient Information 2012 ExitCare, [facility]  Ureteral Stent A ureteral stent is a soft plastic tube with multiple holes. The stent is inserted into a ureter to help drain urine from the kidney into the bladder. The tube has a coil on each end to keep it from falling out. One end stays in the kidney. The other end stays in the bladder. A stent cannot be seen from the outside. Usually it does not keep you from going about normal routines. A ureteral stent is used to bypass a blockage  in your kidney or ureter. This blockage can be caused by kidney stones, scar tissue, pregnancy, or other causes. It can also be used during treatment to remove a kidney stone or to let a ureter heal after surgery. The stent allows urine to drain from the kidney into the bladder. It is most often taken out after the blockage has been removed or the ureter has healed. If a  stent is needed for a long time, it will be changed every few months. INSERTING THE STENT Your stent is put in by a urologist. This is a medical doctor trained for treating genitourinary (kidney, ureter and bladder) problems. Before your stent is put in, your caregiver may order x-rays or other imaging tests of your kidneys and ureters. The stent is inserted in a hospital or same day surgical center. You can anticipate going home the same day. PROCEDURE  A special x-ray machine called a fluoroscope is used to guide the insertion of your stent. This allows your doctor to make sure the stent is in the correct place.   First you are given anesthesia to keep you comfortable.   Then your doctor inserts a special lighted instrument called a cystoscope into your bladder. This allows your doctor to see the opening to the ureter.   A thin wire is carefully threaded into the bladder and up the ureter. The stent is inserted over the wire and the wire is then removed.  HOME CARE INSTRUCTIONS   While the stent is in place, you may feel some discomfort. Certain movements may trigger pain or a feeling that you need to urinate. Your caregiver may give you pain medication. Only take over-the-counter or prescription medicines for pain, discomfort, or fever as directed by your caregiver. Do not take aspirin as this can make bleeding worse.   You may be given medications to prevent infection or bladder spasms. Be sure to take all medications as directed.   Drink plenty of fluids.   You may have small amounts of bleeding causing your urine to be slightly red. This is nothing to be concerned about.  REMOVAL OF THE STENT Your stent is left in until the blockage is resolved. This may take two weeks or longer. Before the stent is removed, you may have an x-ray make sure the ureter is open. The stent can be removed by your caregiver in the office. Medications may be given for comfort. Be sure to keep all follow-up  appointments so your caregiver can check that you are healing properly. SEEK IMMEDIATE MEDICAL CARE IF:   Your urine is dark red or has blood clots.   You are incontinent (leaking urine).   You have an oral temperature above 102 F (38.9 C), chills, nausea (feeling sick to your stomach), or vomiting.   Your pain is not relieved by pain medication. Do not take aspirin as this can make bleeding worse.   The end of the stent comes out of the urethra.  Document Released: 07/17/2000 Document Revised: 07/09/2011 Document Reviewed: 07/16/2008 [facility] Patient Information 2012 [place], [place].Marland Kitchen

## 2011-12-16 NOTE — Progress Notes (Signed)
No change in H&P on reexamination. 

## 2011-12-16 NOTE — Transfer of Care (Signed)
Immediate Anesthesia Transfer of Care Note  Patient: Cathy Thomas  Procedure(s) Performed: Procedure(s) (LRB): CYSTOSCOPY WITH RETROGRADE PYELOGRAM/URETERAL STENT PLACEMENT (Left) STONE EXTRACTION WITH BASKET (Left)  Patient Location: PACU  Anesthesia Type: General  Level of Consciousness: awake, alert  and oriented  Airway & Oxygen Therapy: Patient Spontanous Breathing and Patient connected to face mask oxygen  Post-op Assessment: Report given to PACU RN  Post vital signs: Reviewed and stable  Complications: No apparent anesthesia complications

## 2011-12-16 NOTE — Anesthesia Postprocedure Evaluation (Signed)
  Anesthesia Post-op Note  Patient: Cathy Thomas  Procedure(s) Performed: Procedure(s) (LRB): CYSTOSCOPY WITH RETROGRADE PYELOGRAM/URETERAL STENT PLACEMENT (Left) STONE EXTRACTION WITH BASKET (Left)  Patient Location: PACU  Anesthesia Type: General  Level of Consciousness: awake, alert  and oriented  Airway and Oxygen Therapy: Patient Spontanous Breathing and Patient connected to face mask oxygen  Post-op Pain: mild  Post-op Assessment: Post-op Vital signs reviewed, Patient's Cardiovascular Status Stable, Respiratory Function Stable, Patent Airway and No signs of Nausea or vomiting  Post-op Vital Signs: Reviewed and stable  Complications: No apparent anesthesia complications

## 2011-12-16 NOTE — Anesthesia Preprocedure Evaluation (Addendum)
Anesthesia Evaluation  Patient identified by MRN, date of birth, ID band Patient awake    Reviewed: Allergy & Precautions, H&P , NPO status , Patient's Chart, lab work & pertinent test results  History of Anesthesia Complications Negative for: history of anesthetic complications  Airway Mallampati: II      Dental  (+) Teeth Intact   Pulmonary neg pulmonary ROS,  breath sounds clear to auscultation        Cardiovascular negative cardio ROS  Rhythm:Regular     Neuro/Psych  Neuromuscular disease CVA, No Residual Symptoms    GI/Hepatic   Endo/Other    Renal/GU      Musculoskeletal  (+) Fibromyalgia -  Abdominal   Peds  Hematology   Anesthesia Other Findings   Reproductive/Obstetrics                          Anesthesia Physical Anesthesia Plan  ASA: III  Anesthesia Plan: General   Post-op Pain Management:    Induction: Intravenous  Airway Management Planned: LMA  Additional Equipment:   Intra-op Plan:   Post-operative Plan: Extubation in OR  Informed Consent: I have reviewed the patients History and Physical, chart, labs and discussed the procedure including the risks, benefits and alternatives for the proposed anesthesia with the patient or authorized representative who has indicated his/her understanding and acceptance.     Plan Discussed with:   Anesthesia Plan Comments:         Anesthesia Quick Evaluation

## 2011-12-16 NOTE — Brief Op Note (Signed)
12/16/2011  4:30 PM  PATIENT:  Cathy Thomas  56 y.o. female  PRE-OPERATIVE DIAGNOSIS:  left ureteral calculus  POST-OPERATIVE DIAGNOSIS:  * No post-op diagnosis entered *  PROCEDURE:  Procedure(s) (LRB): CYSTOSCOPY WITH RETROGRADE PYELOGRAM/URETERAL STENT PLACEMENT (Left) STONE EXTRACTION WITH BASKET (Left)  SURGEON:  Surgeon(s) and Role:    * Ky Barban, MD - Primary  PHYSICIAN ASSISTANT:   ASSISTANTS: none   ANESTHESIA:   general  EBL:  Total I/O In: 1000 [I.V.:1000] Out: -   BLOOD ADMINISTERED:none  DRAINS: none   LOCAL MEDICATIONS USED:  NONE  SPECIMEN:  Source of Specimen:  ureteral stone given to the pt . to bring in office to send to the lab.  DISPOSITION OF SPECIMEN:  N/A  COUNTS:  YES  TOURNIQUET:  * No tourniquets in log *  DICTATION: .Other Dictation: Dictation Number dictation [id number]  PLAN OF CARE: Discharge to home after PACU  PATIENT DISPOSITION:  PACU - hemodynamically stable.   Delay start of Pharmacological VTE agent (>24hrs) due to surgical blood loss or risk of bleeding:

## 2011-12-16 NOTE — Anesthesia Procedure Notes (Signed)
Procedure Name: LMA Insertion Date/Time: 12/16/2011 3:41 PM Performed by: Glynn Octave E Pre-anesthesia Checklist: Patient identified, Patient being monitored, Emergency Drugs available, Timeout performed and Suction available Patient Re-evaluated:Patient Re-evaluated prior to inductionOxygen Delivery Method: Circle System Utilized Preoxygenation: Pre-oxygenation with 100% oxygen Intubation Type: IV induction Ventilation: Mask ventilation without difficulty LMA: LMA inserted LMA Size: 4.0 Number of attempts: 1 Placement Confirmation: positive ETCO2 and breath sounds checked- equal and bilateral

## 2011-12-17 NOTE — Op Note (Signed)
Cathy Thomas, Cathy Thomas                ACCOUNT NO.:  [id number]  MEDICAL RECORD NO.:  [id number]  LOCATION:  [facility]                          FACILITY:  [facility]  PHYSICIAN:  Ky Barban, M.D.DATE OF BIRTH:  03/20/1956  DATE OF PROCEDURE: DATE OF DISCHARGE:  12/16/2011                              OPERATIVE REPORT   PREOPERATIVE DIAGNOSIS:  Left distal ureteral calculus.  POSTOP DIAGNOSIS:  Left distal ureteral calculus.  PROCEDURE:  Cystoscopy, left retrograde pyelogram, ureteroscopic stone basket extraction, insertion of double-J stent, size 5-French 24 cm.  ANESTHESIA:  General.  PROCEDURE:  The patient under general anesthesia, in lithotomy position, usual prep and drape. A #25 cystoscope introduced into the bladder.  The left ureteral orifice could be seen.  I suspect there is stone within the intramural ureter and with some difficulty, I was able to do retrograde pyelogram.  Filling defect is seen in the intramural ureter. A Glidewire was passed up into the renal pelvis.  Over the Glidewire, a 15 balloon was inserted.  Intramural ureter was dilated.  Now the balloon is removed.  I introduced short rigid ureteroscope alongside the guidewire.  The stone is visualized.  It was engaged in a basket and removed without any difficulty.  I reinserted the ureteroscope to make sure there is no other stone.  The ureter looked fine.  There is no other stone and I did not have to use the laser to break the stone. Decided to leave a double-J stent.  Double-J stent was positioned over the guidewire under fluoroscopic control.  Nice loop was obtained in the renal pelvis after partially removing the guidewire and the second loop was obtained in the bladder after removing the guidewire completely. The patient is doing fine, left the operating room in satisfactory condition.     Ky Barban, M.D.     MIJ/MEDQ  D:  12/16/2011  T:  12/17/2011  Job:  [id number]

## 2011-12-23 ENCOUNTER — Encounter (HOSPITAL_COMMUNITY): Payer: Self-pay | Admitting: Urology

## 2012-05-12 ENCOUNTER — Other Ambulatory Visit (HOSPITAL_COMMUNITY): Payer: Self-pay | Admitting: Nurse Practitioner

## 2012-05-12 DIAGNOSIS — Z139 Encounter for screening, unspecified: Secondary | ICD-10-CM

## 2012-06-14 ENCOUNTER — Ambulatory Visit (HOSPITAL_COMMUNITY)
Admission: RE | Admit: 2012-06-14 | Discharge: 2012-06-14 | Disposition: A | Discharge status: Home / Self Care | Payer: Medicare PPO | Source: Ambulatory Visit | Attending: Nurse Practitioner | Admitting: Nurse Practitioner

## 2012-06-14 DIAGNOSIS — Z139 Encounter for screening, unspecified: Secondary | ICD-10-CM

## 2012-06-14 DIAGNOSIS — Z1231 Encounter for screening mammogram for malignant neoplasm of breast: Secondary | ICD-10-CM | POA: Insufficient documentation

## 2012-11-20 ENCOUNTER — Encounter (HOSPITAL_COMMUNITY): Payer: Self-pay | Admitting: *Deleted

## 2012-11-20 ENCOUNTER — Emergency Department (HOSPITAL_COMMUNITY)
Admission: EM | Admit: 2012-11-20 | Discharge: 2012-11-20 | Disposition: A | Discharge status: Home / Self Care | Payer: Medicare PPO | Attending: Emergency Medicine | Admitting: Emergency Medicine

## 2012-11-20 DIAGNOSIS — R109 Unspecified abdominal pain: Secondary | ICD-10-CM | POA: Insufficient documentation

## 2012-11-20 DIAGNOSIS — Z7982 Long term (current) use of aspirin: Secondary | ICD-10-CM | POA: Insufficient documentation

## 2012-11-20 DIAGNOSIS — Z8673 Personal history of transient ischemic attack (TIA), and cerebral infarction without residual deficits: Secondary | ICD-10-CM | POA: Insufficient documentation

## 2012-11-20 DIAGNOSIS — Z8739 Personal history of other diseases of the musculoskeletal system and connective tissue: Secondary | ICD-10-CM | POA: Insufficient documentation

## 2012-11-20 DIAGNOSIS — R630 Anorexia: Secondary | ICD-10-CM | POA: Insufficient documentation

## 2012-11-20 DIAGNOSIS — Z79899 Other long term (current) drug therapy: Secondary | ICD-10-CM | POA: Insufficient documentation

## 2012-11-20 DIAGNOSIS — Z87442 Personal history of urinary calculi: Secondary | ICD-10-CM | POA: Insufficient documentation

## 2012-11-20 DIAGNOSIS — K529 Noninfective gastroenteritis and colitis, unspecified: Secondary | ICD-10-CM

## 2012-11-20 DIAGNOSIS — R6883 Chills (without fever): Secondary | ICD-10-CM | POA: Insufficient documentation

## 2012-11-20 DIAGNOSIS — R112 Nausea with vomiting, unspecified: Secondary | ICD-10-CM | POA: Insufficient documentation

## 2012-11-20 DIAGNOSIS — K5289 Other specified noninfective gastroenteritis and colitis: Secondary | ICD-10-CM | POA: Insufficient documentation

## 2012-11-20 LAB — COMPREHENSIVE METABOLIC PANEL
ALT: 23 U/L (ref 0–35)
AST: 26 U/L (ref 0–37)
Albumin: 3.5 g/dL (ref 3.5–5.2)
Alkaline Phosphatase: 111 U/L (ref 39–117)
BUN: 12 mg/dL (ref 6–23)
CO2: 21 mEq/L (ref 19–32)
Calcium: 8.9 mg/dL (ref 8.4–10.5)
Chloride: 104 mEq/L (ref 96–112)
Creatinine, Ser: 0.77 mg/dL (ref 0.50–1.10)
GFR calc Af Amer: >90 mL/min (ref >90)
GFR calc non Af Amer: >90 mL/min (ref >90)
Glucose, Bld: 103 mg/dL — ABNORMAL HIGH (ref 70–99)
Potassium: 3.1 mEq/L — ABNORMAL LOW (ref 3.5–5.1)
Sodium: 137 mEq/L (ref 135–145)
Total Bilirubin: 0.4 mg/dL (ref 0.3–1.2)
Total Protein: 7.7 g/dL (ref 6.0–8.3)

## 2012-11-20 LAB — CBC WITH DIFFERENTIAL/PLATELET
Basophils Absolute: 0 10*3/uL (ref 0.0–0.1)
Basophils Relative: 0 % (ref 0–1)
Eosinophils Absolute: 0 10*3/uL (ref 0.0–0.7)
HCT: 37.6 % (ref 36.0–46.0)
Hemoglobin: 12.4 g/dL (ref 12.0–15.0)
MCH: 25.1 pg — ABNORMAL LOW (ref 26.0–34.0)
MCHC: 33 g/dL (ref 30.0–36.0)
Monocytes Absolute: 0.8 10*3/uL (ref 0.1–1.0)
Monocytes Relative: 9 % (ref 3–12)
Neutrophils Relative %: 79 % — ABNORMAL HIGH (ref 43–77)
RDW: 15.2 % (ref 11.5–15.5)

## 2012-11-20 MED ORDER — PROMETHAZINE HCL 25 MG PO TABS: 25.0000 mg | ORAL_TABLET | Freq: Four times a day (QID) | ORAL | Status: DC | PRN

## 2012-11-20 MED ORDER — SODIUM CHLORIDE 0.9 % IV BOLUS (SEPSIS)
1000.0000 mL | Freq: Once | INTRAVENOUS | Status: AC
  Administered 2012-11-20: 1000 mL via INTRAVENOUS

## 2012-11-20 MED ORDER — ONDANSETRON HCL 4 MG/2ML IJ SOLN
4.0000 mg | Freq: Once | INTRAMUSCULAR | Status: AC
  Administered 2012-11-20: 4 mg via INTRAVENOUS
  Filled 2012-11-20: qty 2

## 2012-11-20 MED ORDER — IBUPROFEN 800 MG PO TABS: 800.0000 mg | ORAL_TABLET | Freq: Three times a day (TID) | ORAL | Status: DC | PRN

## 2012-11-20 MED ORDER — KETOROLAC TROMETHAMINE 30 MG/ML IJ SOLN
30.0000 mg | Freq: Once | INTRAMUSCULAR | Status: AC
  Administered 2012-11-20: 30 mg via INTRAVENOUS
  Filled 2012-11-20: qty 1

## 2012-11-20 NOTE — ED Notes (Signed)
Pt alert & oriented x4, stable gait. Patient given discharge instructions, paperwork & prescription(s). Patient  instructed to stop at the registration desk to finish any additional paperwork. Patient verbalized understanding. Pt left department w/ no further questions. 

## 2012-11-20 NOTE — ED Notes (Signed)
Pt states that her doctor removed her from lyrica by tapering her dose and placed her on Topamax last week, pt states that Wednesday evening she started having chills, n/v, diarrhea that started Thursday, body aches.

## 2012-11-20 NOTE — ED Provider Notes (Signed)
History    This chart was scribed for Benny Lennert, MD by Melba Coon, ED Scribe. The patient was seen in room [facility] and the patient's care was started at 4:36PM.    CSN: [id number]  Arrival date & time 11/20/12  1616   First MD Initiated Contact with Patient 11/20/12 1631      Chief Complaint  Patient presents with  . Chills  . Diarrhea  . Emesis    (Consider location/radiation/quality/duration/timing/severity/associated sxs/prior treatment) Patient is a 57 y.o. female presenting with vomiting. The history is provided by the patient. No language interpreter was used.  Emesis Severity:  Moderate Duration:  3 days Timing:  Constant Emesis appearance: green in color. Feeding tolerance: nothing. Progression:  Worsening Chronicity:  New Associated symptoms: abdominal pain, chills and diarrhea    Cathy Thomas is a 57 y.o. female who presents to the Emergency Department complaining of persistent, moderate to severe nausea, emesis, and diarrhea with an onset 3 days ago with associated burning abdominal pain and chills. She reports she was taking Lyrica for the past 5 years (took three 75 mg tablets daily) but was discontinued last week. She reports that for the past week, she has been in the process of tapering off Lyrica and is currently being switched to Topamax due to recent numbness in her hands with aggravated migraines. Since weaning off Lyrica, she reports her numbness and migraines were improving. However, since 4 days ago when she increased to 2 tablets of Topamax and decreased to 1 tablet of Lyrica, she started to have chills. The very next day, she started to having nausea, emesis, and diarrhea. She called her prescribing provider who, per pt report, advised her to increase to 3 tablets of Topamax and to discontinue Lyrica. This did not improve any of her symptoms. She reports the vomit and diarrhea contents have been green in color. Reports decreased appetite and  fluid intake. Allergic to amoxicillin. No other pertinent medical symptoms.  Past Medical History  Diagnosis Date  . Fibromyalgia   . Kidney stone   . Stroke     Past Surgical History  Procedure Laterality Date  . Abdominal hysterectomy    . Knee arthroscopy    . Cesarean section    . Stone extraction with basket  12/16/2011    Procedure: STONE EXTRACTION WITH BASKET;  Surgeon: Ky Barban, MD;  Location: [facility];  Service: Urology;  Laterality: Left;  . Balloon dilation  12/16/2011    Procedure: BALLOON DILATION;  Surgeon: Ky Barban, MD;  Location: [facility];  Service: Urology;  Laterality: Left;    No family history on file.  History  Substance Use Topics  . Smoking status: Never Smoker   . Smokeless tobacco: Not on file  . Alcohol Use: Yes     Comment: occ    OB History   Grav Para Term Preterm Abortions TAB SAB Ect Mult Living                  Review of Systems  Constitutional: Positive for chills and appetite change (decreased appetite and fluid intake). Negative for fever.  Gastrointestinal: Positive for nausea, vomiting, abdominal pain and diarrhea.  All other systems reviewed and are negative.    Allergies  Amoxicillin  Home Medications   Current Outpatient Rx  Name  Route  Sig  Dispense  Refill  . aspirin EC 81 MG tablet   Oral   Take 81 mg by mouth daily.         Marland Kitchen  magnesium gluconate (MAGONATE) 500 MG tablet   Oral   Take 500 mg by mouth 2 (two) times daily.         Marland Kitchen topiramate (TOPAMAX) 50 MG tablet   Oral   Take 50-100 mg by mouth 2 (two) times daily. Takes one tablet in the morning and two tablets at bedtime         . Vitamin D, Ergocalciferol, (DRISDOL) 50000 UNITS CAPS   Oral   Take 50,000 Units by mouth 2 (two) times a week.           BP 153/61  Pulse 123  Temp(Src) 98.1 F (36.7 C) (Oral)  Resp 20  Ht 5\' 7"  (1.702 m)  Wt 270 lb (122.471 kg)  BMI 42.28 kg/m2  SpO2 99%  Physical Exam  Nursing note and  vitals reviewed. Constitutional: She is oriented to person, place, and time. She appears well-developed.  HENT:  Head: Normocephalic.  Eyes: Conjunctivae and EOM are normal. No scleral icterus.  Neck: Neck supple. No thyromegaly present.  Cardiovascular: Normal rate and regular rhythm.  Exam reveals no gallop and no friction rub.   No murmur heard. Pulmonary/Chest: No stridor. She has no wheezes. She has no rales. She exhibits no tenderness.  Abdominal: Soft. Bowel sounds are normal. She exhibits no distension. There is tenderness. There is no rebound.  Mild diffuse tenderness  Musculoskeletal: Normal range of motion. She exhibits no edema.  Lymphadenopathy:    She has no cervical adenopathy.  Neurological: She is oriented to person, place, and time. Coordination normal.  Skin: No rash noted. No erythema.  Psychiatric: She has a normal mood and affect. Her behavior is normal.    ED Course  Procedures (including critical care time)  DIAGNOSTIC STUDIES: Oxygen Saturation is 99% on room air, normal by my interpretation.    COORDINATION OF CARE:  4:40PM - IV fluids, Toradol, Zofran, CBC with differential, and CMP will be ordered for Cathy Thomas.   5:30PM - lab results reviewed Labs Reviewed  CBC WITH DIFFERENTIAL - Abnormal; Notable for the following:    MCV 76.1 (*)    MCH 25.1 (*)    Neutrophils Relative 79 (*)    Lymphocytes Relative 11 (*)    All other components within normal limits  COMPREHENSIVE METABOLIC PANEL - Abnormal; Notable for the following:    Potassium 3.1 (*)    Glucose, Bld 103 (*)    All other components within normal limits    6:46PM - recheck; pt's condition has improved and stable with medication treatment here at the ED. Ibuprofen and an anti-emetic medication will be prescribed. She is ready for d/c.  No results found.   No diagnosis found.    MDM   The chart was scribed for me under my direct supervision.  I personally performed the  history, physical, and medical decision making and all procedures in the evaluation of this patient.Benny Lennert, MD 11/20/12 [phone]

## 2013-04-16 ENCOUNTER — Encounter (HOSPITAL_COMMUNITY): Payer: Self-pay | Admitting: *Deleted

## 2013-04-16 ENCOUNTER — Emergency Department (HOSPITAL_COMMUNITY)
Admission: EM | Admit: 2013-04-16 | Discharge: 2013-04-16 | Disposition: A | Discharge status: Home / Self Care | Payer: Medicare PPO | Attending: Emergency Medicine | Admitting: Emergency Medicine

## 2013-04-16 DIAGNOSIS — Z8673 Personal history of transient ischemic attack (TIA), and cerebral infarction without residual deficits: Secondary | ICD-10-CM | POA: Insufficient documentation

## 2013-04-16 DIAGNOSIS — Z87442 Personal history of urinary calculi: Secondary | ICD-10-CM | POA: Insufficient documentation

## 2013-04-16 DIAGNOSIS — H6123 Impacted cerumen, bilateral: Secondary | ICD-10-CM

## 2013-04-16 DIAGNOSIS — H6120 Impacted cerumen, unspecified ear: Secondary | ICD-10-CM | POA: Insufficient documentation

## 2013-04-16 DIAGNOSIS — Z79899 Other long term (current) drug therapy: Secondary | ICD-10-CM | POA: Insufficient documentation

## 2013-04-16 DIAGNOSIS — Z7982 Long term (current) use of aspirin: Secondary | ICD-10-CM | POA: Insufficient documentation

## 2013-04-16 MED ORDER — NEOMYCIN-POLYMYXIN-HC 1 % OT SOLN
4.0000 [drp] | Freq: Four times a day (QID) | OTIC | Status: DC
  Administered 2013-04-16: 4 [drp] via OTIC
  Filled 2013-04-16: qty 10

## 2013-04-16 NOTE — ED Notes (Signed)
Pt states loss of hearing of left ear x 2 mo. States she seen PMD and was placed on flonase which helped some for a little while but flonase is no longer working. States "it sounds like the ocean"

## 2013-04-16 NOTE — ED Provider Notes (Signed)
CSN: [id number]     Arrival date & time 04/16/13  1254 History   First MD Initiated Contact with Patient 04/16/13 1306     Chief Complaint  Patient presents with  . Ear Fullness   (Consider location/radiation/quality/duration/timing/severity/associated sxs/prior Treatment) HPI Cathy Thomas is a 57 y.o. female who presents to the ED with complaint of ears feeling full for the past 2 months with decreased hearing. She went to her PCP and told her to use Flonase but it doesn't work. She denies fever, chill, or any other problems.  Past Medical History  Diagnosis Date  . Fibromyalgia   . Kidney stone   . Stroke    Past Surgical History  Procedure Laterality Date  . Abdominal hysterectomy    . Knee arthroscopy    . Cesarean section    . Stone extraction with basket  12/16/2011    Procedure: STONE EXTRACTION WITH BASKET;  Surgeon: Ky Barban, MD;  Location: [facility];  Service: Urology;  Laterality: Left;  . Balloon dilation  12/16/2011    Procedure: BALLOON DILATION;  Surgeon: Ky Barban, MD;  Location: [facility];  Service: Urology;  Laterality: Left;   No family history on file. History  Substance Use Topics  . Smoking status: Never Smoker   . Smokeless tobacco: Not on file  . Alcohol Use: Yes     Comment: occ   OB History   Grav Para Term Preterm Abortions TAB SAB Ect Mult Living                 Review of Systems  Constitutional: Negative for fever and chills.  HENT: Negative for neck pain. Ear pain: fullness.   Eyes: Negative for visual disturbance.  Respiratory: Negative for cough.   Cardiovascular: Negative for chest pain.  Gastrointestinal: Negative for nausea and vomiting.  Musculoskeletal: Negative for back pain.  Skin: Negative for rash.  Psychiatric/Behavioral: The patient is not nervous/anxious.     Allergies  Amoxicillin  Home Medications   Current Outpatient Rx  Name  Route  Sig  Dispense  Refill  . aspirin EC 81 MG tablet   Oral   Take 81  mg by mouth daily.         Marland Kitchen ibuprofen (ADVIL,MOTRIN) 800 MG tablet   Oral   Take 1 tablet (800 mg total) by mouth every 8 (eight) hours as needed for pain.   21 tablet   0   . magnesium gluconate (MAGONATE) 500 MG tablet   Oral   Take 500 mg by mouth 2 (two) times daily.         . pregabalin (LYRICA) 25 MG capsule   Oral   Take 25-50 mg by mouth 2 (two) times daily. 2 in am and 1 at night         . Vitamin D, Ergocalciferol, (DRISDOL) 50000 UNITS CAPS   Oral   Take 50,000 Units by mouth 2 (two) times a week.          BP 136/59  Pulse 75  Temp(Src) 98.1 F (36.7 C) (Oral)  Resp 21  Ht 5\' 7"  (1.702 m)  Wt 260 lb (117.935 kg)  BMI 40.71 kg/m2  SpO2 99% Physical Exam  Nursing note and vitals reviewed. Constitutional: She is oriented to person, place, and time. She appears well-developed and well-nourished. No distress.  HENT:  Head: Normocephalic and atraumatic.  Cerumen impaction bilateral ears.    Eyes: EOM are normal.  Neck: Neck supple.  Cardiovascular: Normal rate.   Pulmonary/Chest: Effort normal.  Musculoskeletal: Normal range of motion.  Neurological: She is alert and oriented to person, place, and time. No cranial nerve deficit.  Skin: Skin is warm and dry.  Psychiatric: She has a normal mood and affect. Her behavior is normal.    ED Course  Procedures Ear irrigation with good results. Large amount of cerumen removed from both ears. Patient re examined and TM's are normal. Ear canals irritated from irrigation.   MDM   1. Cerumen impaction, bilateral    57 y.o. female with bilateral cerumen impaction. Removed with irrigation without difficulty. Cortisporin Otic drops instilled here and patient to continue to use 4 times a day x 5 days. She will follow up with her PCP or return here as needed.  Discussed with the patient and all questioned fully answered.    Medication List    ASK your doctor about these medications       aspirin EC 81 MG  tablet  Take 81 mg by mouth daily.     ibuprofen 800 MG tablet  Commonly known as:  ADVIL,MOTRIN  Take 1 tablet (800 mg total) by mouth every 8 (eight) hours as needed for pain.     magnesium gluconate 500 MG tablet  Commonly known as:  MAGONATE  Take 500 mg by mouth 2 (two) times daily.     pregabalin 25 MG capsule  Commonly known as:  LYRICA  Take 25-50 mg by mouth 2 (two) times daily. 2 in am and 1 at night     Vitamin D (Ergocalciferol) 50000 UNITS Caps capsule  Commonly known as:  DRISDOL  Take 50,000 Units by mouth 2 (two) times a week.           Janne Napoleon, [place] 04/16/13 [phone]

## 2013-04-16 NOTE — ED Provider Notes (Signed)
Medical screening examination/treatment/procedure(s) were performed by non-physician practitioner and as supervising physician I was immediately available for consultation/collaboration.  Raeford Razor, MD 04/16/13 1520

## 2013-06-05 ENCOUNTER — Other Ambulatory Visit: Payer: Self-pay | Admitting: Obstetrics and Gynecology

## 2013-06-05 ENCOUNTER — Encounter: Payer: Self-pay | Admitting: Internal Medicine

## 2013-06-05 ENCOUNTER — Other Ambulatory Visit (HOSPITAL_COMMUNITY): Payer: Self-pay | Admitting: Nurse Practitioner

## 2013-06-05 DIAGNOSIS — Z139 Encounter for screening, unspecified: Secondary | ICD-10-CM

## 2013-06-16 ENCOUNTER — Ambulatory Visit (HOSPITAL_COMMUNITY)
Admission: RE | Admit: 2013-06-16 | Discharge: 2013-06-16 | Disposition: A | Discharge status: Home / Self Care | Payer: Medicare PPO | Source: Ambulatory Visit | Attending: Nurse Practitioner | Admitting: Nurse Practitioner

## 2013-06-16 DIAGNOSIS — Z139 Encounter for screening, unspecified: Secondary | ICD-10-CM

## 2013-06-16 DIAGNOSIS — Z1231 Encounter for screening mammogram for malignant neoplasm of breast: Secondary | ICD-10-CM | POA: Insufficient documentation

## 2013-06-27 ENCOUNTER — Encounter (HOSPITAL_COMMUNITY): Payer: Self-pay | Admitting: Emergency Medicine

## 2013-06-27 ENCOUNTER — Emergency Department (HOSPITAL_COMMUNITY): Payer: Medicare PPO

## 2013-06-27 ENCOUNTER — Emergency Department (HOSPITAL_COMMUNITY)
Admission: EM | Admit: 2013-06-27 | Discharge: 2013-06-28 | Disposition: A | Discharge status: Home / Self Care | Payer: Medicare PPO | Attending: Emergency Medicine | Admitting: Emergency Medicine

## 2013-06-27 DIAGNOSIS — Z880 Allergy status to penicillin: Secondary | ICD-10-CM | POA: Insufficient documentation

## 2013-06-27 DIAGNOSIS — K8020 Calculus of gallbladder without cholecystitis without obstruction: Secondary | ICD-10-CM | POA: Insufficient documentation

## 2013-06-27 DIAGNOSIS — R109 Unspecified abdominal pain: Secondary | ICD-10-CM

## 2013-06-27 DIAGNOSIS — IMO0001 Reserved for inherently not codable concepts without codable children: Secondary | ICD-10-CM | POA: Insufficient documentation

## 2013-06-27 DIAGNOSIS — Z87442 Personal history of urinary calculi: Secondary | ICD-10-CM | POA: Insufficient documentation

## 2013-06-27 DIAGNOSIS — Z79899 Other long term (current) drug therapy: Secondary | ICD-10-CM | POA: Insufficient documentation

## 2013-06-27 DIAGNOSIS — Z8673 Personal history of transient ischemic attack (TIA), and cerebral infarction without residual deficits: Secondary | ICD-10-CM | POA: Insufficient documentation

## 2013-06-27 LAB — URINALYSIS, ROUTINE W REFLEX MICROSCOPIC
Glucose, UA: NEGATIVE mg/dL
Leukocytes, UA: NEGATIVE
Specific Gravity, Urine: >1.03 — ABNORMAL HIGH (ref 1.005–1.030)
pH: 6 (ref 5.0–8.0)

## 2013-06-27 LAB — CBC
HCT: 38.7 % (ref 36.0–46.0)
Hemoglobin: 12.4 g/dL (ref 12.0–15.0)
RBC: 4.84 MIL/uL (ref 3.87–5.11)
RDW: 14.5 % (ref 11.5–15.5)
WBC: 11.6 10*3/uL — ABNORMAL HIGH (ref 4.0–10.5)

## 2013-06-27 MED ORDER — FENTANYL CITRATE 0.05 MG/ML IJ SOLN: 50.0000 ug | INTRAMUSCULAR | Status: DC | PRN

## 2013-06-27 MED ORDER — KETOROLAC TROMETHAMINE 30 MG/ML IJ SOLN
30.0000 mg | Freq: Once | INTRAMUSCULAR | Status: AC
  Administered 2013-06-27: 30 mg via INTRAVENOUS
  Filled 2013-06-27: qty 1

## 2013-06-27 MED ORDER — ONDANSETRON HCL 4 MG/2ML IJ SOLN
4.0000 mg | Freq: Once | INTRAMUSCULAR | Status: AC
  Administered 2013-06-27: 4 mg via INTRAVENOUS
  Filled 2013-06-27: qty 2

## 2013-06-27 NOTE — ED Provider Notes (Signed)
CSN: [id number]     Arrival date & time 06/27/13  2030 History   First MD Initiated Contact with Patient 06/27/13 2254     Chief Complaint  Patient presents with  . Flank Pain   (Consider location/radiation/quality/duration/timing/severity/associated sxs/prior Treatment) Patient is a 57 y.o. female presenting with flank pain. The history is provided by the patient. No language interpreter was used.  Flank Pain This is a recurrent problem. The current episode started yesterday. The problem occurs constantly. The problem has been gradually worsening. Pertinent negatives include no chest pain. Nothing relieves the symptoms. She has tried nothing for the symptoms.  HPI Comments: Cathy Thomas is a 57 y.o. female with hx of kidney stones  who presents to the Emergency Department complaining of constant moderate right side dull flank pain that is worse at night which has radiated to her back with associated urinary frequency. Pt denies blood in urine, dysuria, fevers. No N/V/D Urologist: Dr. Jerre Simon Past Medical History  Diagnosis Date  . Fibromyalgia   . Kidney stone   . Stroke    Past Surgical History  Procedure Laterality Date  . Abdominal hysterectomy    . Knee arthroscopy    . Cesarean section    . Stone extraction with basket  12/16/2011    Procedure: STONE EXTRACTION WITH BASKET;  Surgeon: Ky Barban, MD;  Location: [facility];  Service: Urology;  Laterality: Left;  . Balloon dilation  12/16/2011    Procedure: BALLOON DILATION;  Surgeon: Ky Barban, MD;  Location: [facility];  Service: Urology;  Laterality: Left;   History reviewed. No pertinent family history. History  Substance Use Topics  . Smoking status: Never Smoker   . Smokeless tobacco: Not on file  . Alcohol Use: Yes     Comment: occ   OB History   Grav Para Term Preterm Abortions TAB SAB Ect Mult Living                 Review of Systems  Constitutional: Negative for fever and chills.  Cardiovascular:  Negative for chest pain.  Gastrointestinal: Positive for nausea. Negative for vomiting, diarrhea and blood in stool.  Genitourinary: Positive for flank pain. Negative for dysuria.  All other systems reviewed and are negative.    Allergies  Amoxicillin  Home Medications   Current Outpatient Rx  Name  Route  Sig  Dispense  Refill  . ibuprofen (ADVIL,MOTRIN) 800 MG tablet   Oral   Take 1 tablet (800 mg total) by mouth every 8 (eight) hours as needed for pain.   21 tablet   0   . magnesium gluconate (MAGONATE) 500 MG tablet   Oral   Take 500 mg by mouth daily.          . pregabalin (LYRICA) 25 MG capsule   Oral   Take 25-50 mg by mouth 2 (two) times daily. 2 in am and 1 at night         . Vitamin D, Ergocalciferol, (DRISDOL) 50000 UNITS CAPS   Oral   Take 50,000 Units by mouth 2 (two) times a week.          BP 148/72  Pulse 90  Temp(Src) 98 F (36.7 C) (Oral)  Resp 20  Ht 5\' 7"  (1.702 m)  Wt 260 lb (117.935 kg)  BMI 40.71 kg/m2  SpO2 100% Physical Exam  Nursing note and vitals reviewed. Constitutional: She is oriented to person, place, and time. She appears well-developed and well-nourished.  HENT:  Head: Normocephalic and atraumatic.  Eyes: Conjunctivae and EOM are normal. Pupils are equal, round, and reactive to light.  Neck: Normal range of motion. Neck supple.  Cardiovascular: Normal rate, regular rhythm and normal heart sounds.   Pulmonary/Chest: Effort normal and breath sounds normal.  Abdominal: Soft. Bowel sounds are normal. There is tenderness (right flank).  No CVA tenderness  Musculoskeletal: Normal range of motion.  Neurological: She is alert and oriented to person, place, and time.  Skin: Skin is warm and dry.  Pink color  Psychiatric: She has a normal mood and affect. Her behavior is normal.    ED Course  Procedures (including critical care time) DIAGNOSTIC STUDIES: Oxygen Saturation is 100% on RA, normal  by my interpretation.     COORDINATION OF CARE:   11:00 PM- Pt advised of plan for treatment pain medication and pt agrees.  Labs Review Labs Reviewed  URINALYSIS, ROUTINE W REFLEX MICROSCOPIC - Abnormal; Notable for the following:    Specific Gravity, Urine >1.030 (*)    Ketones, ur TRACE (*)    All other components within normal limits  CBC - Abnormal; Notable for the following:    WBC 11.6 (*)    MCH 25.6 (*)    All other components within normal limits  COMPREHENSIVE METABOLIC PANEL   Imaging Review Ct Abdomen Pelvis Wo Contrast  06/28/2013   CLINICAL DATA:  Right flank pain  EXAM: CT ABDOMEN AND PELVIS WITHOUT CONTRAST  TECHNIQUE: Multidetector CT imaging of the abdomen and pelvis was performed following the standard protocol without intravenous contrast.  COMPARISON:  Prior CT from 12/04/2011  FINDINGS: The visualized lung bases are clear.  Limited noncontrast evaluation of the liver is unremarkable. Large calcified gallstone measuring up to 2.6 cm is again seen within the gallbladder lumen. No CT evidence of acute cholecystitis. No biliary ductal dilatation.  With a hypodensities within the spleen are grossly stable as compared to prior studies. The adrenal glands and pancreas demonstrate a normal unenhanced appearance.  The right kidney is unremarkable without evidence of nephrolithiasis or hydronephrosis. No stones are seen along the course of the right renal collecting system. On the left, a 2 mm nonobstructive stone is seen within the interpolar region (series 4, image 55). No obstructive stones are seen along the course of the left renal collecting system and there is no hydronephrosis or hydroureter.  There is no evidence of bowel obstruction. Appendix is not definitely visualized, however, no inflammatory changes are seen about the cecum or within the right lower quadrant to suggest acute appendicitis. No abnormal wall thickening or inflammatory fat stranding seen about the bowels.  Bladder is  unremarkable.  Uterus and ovaries are not visualized.  No free air or fluid is identified. No pathologically enlarged intra-abdominal or pelvic lymph nodes are seen.  Osseous structures are within normal limits. No focal lytic or blastic osseous lesions are seen. Degenerative disc disease is seen at the L5-S1 level. 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  IMPRESSION: 1. No CT evidence of right-sided nephrolithiasis or obstructive uropathy to explain right flank pain. 2. Single 2 mm nonobstructive left renal calculus. 3. No acute intra-abdominal pelvic process identified. 4. Cholelithiasis without CT evidence of acute cholecystitis. 5. Unchanged splenic hypodensities, incompletely characterized on this noncontrast examination. These findings are stable as compared to studies dating back to 03/24/2010.   Electronically Signed   By: Rise Mu M.D.   On: 06/28/2013 00:11    IV fentanyl,  toradol  12:49 AM pain improved, no N/V. Plan gallbladder ultrasound tomorrow as an outpatient. No ultrasound available in the emergency department at this time. Prescription for pain medications and referral to neurology as needed. Strict return precautions verbalized is understood. Patient is comfortable with plan and all questions were answered.  MDM  Diagnosis: Right flank pain, cholelithiasis  Evaluated with urinalysis, labs and imaging reviewed as above. Improved with IV medications including IV narcotics. Vital signs and nursing notes reviewed and considered  I personally performed the services described in this documentation, which was scribed in my presence. The recorded information has been reviewed and is accurate.    Sunnie Nielsen, MD 06/28/13 [phone]

## 2013-06-27 NOTE — ED Notes (Signed)
Patient complaining of right side flank pain since yesterday.

## 2013-06-27 NOTE — ED Notes (Signed)
Pt with right flank pain, hx of kidney stones but usually on left side per pt, pt denies N/V

## 2013-06-27 NOTE — ED Notes (Signed)
Doctor in room at this time. 

## 2013-06-28 ENCOUNTER — Ambulatory Visit (HOSPITAL_COMMUNITY)
Admit: 2013-06-28 | Discharge: 2013-06-28 | Disposition: A | Discharge status: Home / Self Care | Payer: Medicare PPO | Source: Ambulatory Visit | Attending: Emergency Medicine | Admitting: Emergency Medicine

## 2013-06-28 DIAGNOSIS — R109 Unspecified abdominal pain: Secondary | ICD-10-CM | POA: Insufficient documentation

## 2013-06-28 DIAGNOSIS — N289 Disorder of kidney and ureter, unspecified: Secondary | ICD-10-CM | POA: Insufficient documentation

## 2013-06-28 DIAGNOSIS — K8020 Calculus of gallbladder without cholecystitis without obstruction: Secondary | ICD-10-CM | POA: Insufficient documentation

## 2013-06-28 LAB — COMPREHENSIVE METABOLIC PANEL
Albumin: 3.8 g/dL (ref 3.5–5.2)
Alkaline Phosphatase: 110 U/L (ref 39–117)
BUN: 11 mg/dL (ref 6–23)
CO2: 27 mEq/L (ref 19–32)
Chloride: 102 mEq/L (ref 96–112)
Potassium: 3.8 mEq/L (ref 3.5–5.1)
Total Bilirubin: 0.8 mg/dL (ref 0.3–1.2)

## 2013-06-28 MED ORDER — ONDANSETRON HCL 4 MG PO TABS: 4.0000 mg | ORAL_TABLET | Freq: Four times a day (QID) | ORAL | Status: DC

## 2013-06-28 MED ORDER — OXYCODONE-ACETAMINOPHEN 5-325 MG PO TABS: 2.0000 | ORAL_TABLET | ORAL | Status: DC | PRN

## 2014-06-15 ENCOUNTER — Emergency Department (HOSPITAL_COMMUNITY): Payer: Medicare PPO

## 2014-06-15 ENCOUNTER — Other Ambulatory Visit (HOSPITAL_COMMUNITY): Payer: Self-pay | Admitting: Nurse Practitioner

## 2014-06-15 ENCOUNTER — Emergency Department (HOSPITAL_COMMUNITY)
Admission: EM | Admit: 2014-06-15 | Discharge: 2014-06-16 | Disposition: A | Discharge status: Home / Self Care | Payer: Medicare PPO | Attending: Emergency Medicine | Admitting: Emergency Medicine

## 2014-06-15 ENCOUNTER — Encounter (HOSPITAL_COMMUNITY): Payer: Self-pay | Admitting: Emergency Medicine

## 2014-06-15 DIAGNOSIS — Z880 Allergy status to penicillin: Secondary | ICD-10-CM | POA: Insufficient documentation

## 2014-06-15 DIAGNOSIS — R200 Anesthesia of skin: Secondary | ICD-10-CM | POA: Diagnosis present

## 2014-06-15 DIAGNOSIS — R202 Paresthesia of skin: Secondary | ICD-10-CM | POA: Diagnosis not present

## 2014-06-15 DIAGNOSIS — R42 Dizziness and giddiness: Secondary | ICD-10-CM | POA: Diagnosis not present

## 2014-06-15 DIAGNOSIS — Z1231 Encounter for screening mammogram for malignant neoplasm of breast: Secondary | ICD-10-CM

## 2014-06-15 DIAGNOSIS — Z87442 Personal history of urinary calculi: Secondary | ICD-10-CM | POA: Insufficient documentation

## 2014-06-15 DIAGNOSIS — R51 Headache: Secondary | ICD-10-CM | POA: Diagnosis not present

## 2014-06-15 DIAGNOSIS — Z791 Long term (current) use of non-steroidal anti-inflammatories (NSAID): Secondary | ICD-10-CM | POA: Diagnosis not present

## 2014-06-15 DIAGNOSIS — Z8673 Personal history of transient ischemic attack (TIA), and cerebral infarction without residual deficits: Secondary | ICD-10-CM | POA: Diagnosis not present

## 2014-06-15 DIAGNOSIS — Z79899 Other long term (current) drug therapy: Secondary | ICD-10-CM | POA: Diagnosis not present

## 2014-06-15 HISTORY — DX: Transient cerebral ischemic attack, unspecified: G45.9

## 2014-06-15 LAB — BASIC METABOLIC PANEL
ANION GAP: 12 (ref 5–15)
BUN: 13 mg/dL (ref 6–23)
CO2: 26 mEq/L (ref 19–32)
CREATININE: 0.71 mg/dL (ref 0.50–1.10)
Calcium: 9.5 mg/dL (ref 8.4–10.5)
Chloride: 103 mEq/L (ref 96–112)
Glucose, Bld: 111 mg/dL — ABNORMAL HIGH (ref 70–99)
Potassium: 3.2 mEq/L — ABNORMAL LOW (ref 3.7–5.3)
Sodium: 141 mEq/L (ref 137–147)

## 2014-06-15 LAB — CBC WITH DIFFERENTIAL/PLATELET
BASOS ABS: 0 10*3/uL (ref 0.0–0.1)
Basophils Relative: 0 % (ref 0–1)
EOS ABS: 0.2 10*3/uL (ref 0.0–0.7)
Eosinophils Relative: 2 % (ref 0–5)
HCT: 37.3 % (ref 36.0–46.0)
HEMOGLOBIN: 12.2 g/dL (ref 12.0–15.0)
Lymphocytes Relative: 26 % (ref 12–46)
Lymphs Abs: 2.8 10*3/uL (ref 0.7–4.0)
MCH: 25.6 pg — AB (ref 26.0–34.0)
MCHC: 32.7 g/dL (ref 30.0–36.0)
MCV: 78.2 fL (ref 78.0–100.0)
MONOS PCT: 7 % (ref 3–12)
Monocytes Absolute: 0.8 10*3/uL (ref 0.1–1.0)
NEUTROS PCT: 65 % (ref 43–77)
Neutro Abs: 7 10*3/uL (ref 1.7–7.7)
Platelets: 306 10*3/uL (ref 150–400)
RBC: 4.77 MIL/uL (ref 3.87–5.11)
RDW: 14.9 % (ref 11.5–15.5)
WBC: 10.8 10*3/uL — ABNORMAL HIGH (ref 4.0–10.5)

## 2014-06-15 LAB — CBG MONITORING, ED: GLUCOSE-CAPILLARY: 114 mg/dL — AB (ref 70–99)

## 2014-06-15 LAB — TROPONIN I: Troponin I: <0.3 ng/mL (ref <0.30)

## 2014-06-15 NOTE — ED Notes (Signed)
Patient reports, "This all started around 5:00 p.m. I don't feel right in the head, it feels full, and my left arm is numb." Patient also reports some numbness in mouth.

## 2014-06-15 NOTE — ED Provider Notes (Signed)
CSN: [id number]     Arrival date & time 06/15/14  2028 History  This chart was scribed for Hoy Morn, MD by Tula Nakayama, ED Scribe. This patient was seen in room [facility] and the patient's care was started at 8:53 PM.    Chief Complaint  Patient presents with  . Numbness   HPI  HPI Comments: Cathy Thomas is a 58 y.o. female who presents to the Emergency Department complaining of constant tingling in left arm distal to her elbow that started 4 hours ago. She notes feeling "facial fullness or swelling," bilaterally as well as dizziness, and lack of clear-headedness as associated symptoms. Pt was driving at the onset of symptoms. She took two Advil PTA with some relief. Pt takes Aspirin daily and notes that she has been excercising less often than normal. She has a history of present symptoms that occurred 4 years ago and was diagnosed as a TIA by [facility]. Pt has history of DM and HTN, but has controlled diagnoses with diet and exercise. Pt denies leg pain or numbness as associated symptoms. Denies weakness of her arms or legs.  PCP is [employer]  Past Medical History  Diagnosis Date  . Fibromyalgia   . Kidney stone   . Stroke   . TIA (transient ischemic attack)    Past Surgical History  Procedure Laterality Date  . Abdominal hysterectomy    . Knee arthroscopy    . Cesarean section    . Stone extraction with basket  12/16/2011    Procedure: STONE EXTRACTION WITH BASKET;  Surgeon: Marissa Nestle, MD;  Location: [facility];  Service: Urology;  Laterality: Left;  . Balloon dilation  12/16/2011    Procedure: BALLOON DILATION;  Surgeon: Marissa Nestle, MD;  Location: [facility];  Service: Urology;  Laterality: Left;   History reviewed. No pertinent family history. History  Substance Use Topics  . Smoking status: Never Smoker   . Smokeless tobacco: Not on file  . Alcohol Use: Yes     Comment: occ   OB History    No data available     Review of Systems  10 Systems  reviewed and all are negative for acute change except as noted in the HPI.   Allergies  Amoxicillin  Home Medications   Prior to Admission medications   Medication Sig Start Date End Date Taking? Authorizing Provider  ibuprofen (ADVIL,MOTRIN) 800 MG tablet Take 1 tablet (800 mg total) by mouth every 8 (eight) hours as needed for pain. 11/20/12   Maudry Diego, MD  LYRICA 75 MG capsule 75 mg 3 (three) times daily. 05/31/14   Historical Provider, MD  magnesium gluconate (MAGONATE) 500 MG tablet Take 500 mg by mouth daily.     Historical Provider, MD  ondansetron (ZOFRAN) 4 MG tablet Take 1 tablet (4 mg total) by mouth every 6 (six) hours. 06/28/13   Teressa Lower, MD  oxyCODONE-acetaminophen (PERCOCET/ROXICET) 5-325 MG per tablet Take 2 tablets by mouth every 4 (four) hours as needed for severe pain. 06/28/13   Teressa Lower, MD  pregabalin (LYRICA) 25 MG capsule Take 25-50 mg by mouth 2 (two) times daily. 2 in am and 1 at night    Historical Provider, MD  Vitamin D, Ergocalciferol, (DRISDOL) 50000 UNITS CAPS Take 50,000 Units by mouth 2 (two) times a week.    Historical Provider, MD   BP 151/75 mmHg  Pulse 84  Temp(Src) 97.7 F (36.5 C) (Oral)  Resp 18  SpO2  100% Physical Exam  Constitutional: She is oriented to person, place, and time. She appears well-developed and well-nourished. No distress.  HENT:  Head: Normocephalic and atraumatic.  Eyes: EOM are normal. Pupils are equal, round, and reactive to light.  Neck: Normal range of motion.  Cardiovascular: Normal rate, regular rhythm and normal heart sounds.   No carotid bruits  Pulmonary/Chest: Effort normal and breath sounds normal.  Abdominal: Soft. She exhibits no distension. There is no tenderness.  Musculoskeletal: Normal range of motion.  Neurological: She is alert and oriented to person, place, and time.  5/5 strength in major muscle groups of  bilateral upper and lower extremities. Speech normal. No facial asymetry. Normal  finger-to-nose bilaterally.normal light touch sensation bilaterally upper extremities  Skin: Skin is warm and dry.  Psychiatric: She has a normal mood and affect. Judgment normal.  Nursing note and vitals reviewed.   ED Course  Procedures (including critical care time)  DIAGNOSTIC STUDIES: Oxygen Saturation is 100% on RA, normal by my interpretation.    COORDINATION OF CARE: 9:00 PM Discussed treatment plan with pt which includes CT chest and lab work and pt agreed to plan.  Labs Review Labs Reviewed  CBG MONITORING, ED - Abnormal; Notable for the following:    Glucose-Capillary 114 (*)    All other components within normal limits  CBC WITH DIFFERENTIAL  BASIC METABOLIC PANEL  TROPONIN I    Imaging Review No results found.   EKG Interpretation None      MDM   Final diagnoses:  Numbness and tingling in left arm    Paresthesias left arm.  Low suspicion for stroke.  She was having mild headache and dizziness.  Imaging and labs are without abnormalities.  Patient is in sinus rhythm.  No carotid bruits.  She is on 81 mg aspirin daily.  Her paresthesias in her left arm are improving without treatment here in the ER.  PCP follow-up.  May benefit from outpatient neurology evaluation and myelography.  She understands to return to the ER for new or worsening symptoms  I personally performed the services described in this documentation, which was scribed in my presence. The recorded information has been reviewed and is accurate.      Hoy Morn, MD 06/16/14 [phone]

## 2014-06-15 NOTE — ED Notes (Signed)
MD at bedside. 

## 2014-06-15 NOTE — ED Notes (Signed)
Pt states numbness to the left arm from the elbow down. Pt also says her head feels full. Pt as good sensation, & movement. Pulses present. Pt alert & oriented.

## 2014-06-15 NOTE — ED Notes (Signed)
Pt states her head still feels full & left arm numbness is about the same. NAD noted.

## 2014-06-16 NOTE — ED Notes (Signed)
Pt alert & oriented x4, stable gait. Patient given discharge instructions, paperwork & prescription(s). Patient  instructed to stop at the registration desk to finish any additional paperwork. Patient verbalized understanding. Pt left department w/ no further questions. 

## 2014-06-16 NOTE — Discharge Instructions (Signed)

## 2014-06-21 ENCOUNTER — Ambulatory Visit (HOSPITAL_COMMUNITY)
Admission: RE | Admit: 2014-06-21 | Discharge: 2014-06-21 | Disposition: A | Discharge status: Home / Self Care | Payer: Medicare PPO | Source: Ambulatory Visit | Attending: Nurse Practitioner | Admitting: Nurse Practitioner

## 2014-06-21 DIAGNOSIS — Z1231 Encounter for screening mammogram for malignant neoplasm of breast: Secondary | ICD-10-CM | POA: Insufficient documentation

## 2014-11-25 ENCOUNTER — Emergency Department (HOSPITAL_COMMUNITY): Payer: Medicare PPO

## 2014-11-25 ENCOUNTER — Emergency Department (HOSPITAL_COMMUNITY)
Admission: EM | Admit: 2014-11-25 | Discharge: 2014-11-25 | Disposition: A | Discharge status: Home / Self Care | Payer: Medicare PPO | Attending: Emergency Medicine | Admitting: Emergency Medicine

## 2014-11-25 ENCOUNTER — Encounter (HOSPITAL_COMMUNITY): Payer: Self-pay | Admitting: *Deleted

## 2014-11-25 DIAGNOSIS — Z79899 Other long term (current) drug therapy: Secondary | ICD-10-CM | POA: Diagnosis not present

## 2014-11-25 DIAGNOSIS — Z880 Allergy status to penicillin: Secondary | ICD-10-CM | POA: Insufficient documentation

## 2014-11-25 DIAGNOSIS — S8991XA Unspecified injury of right lower leg, initial encounter: Secondary | ICD-10-CM | POA: Diagnosis present

## 2014-11-25 DIAGNOSIS — Y998 Other external cause status: Secondary | ICD-10-CM | POA: Insufficient documentation

## 2014-11-25 DIAGNOSIS — Y9289 Other specified places as the place of occurrence of the external cause: Secondary | ICD-10-CM | POA: Diagnosis not present

## 2014-11-25 DIAGNOSIS — Z8739 Personal history of other diseases of the musculoskeletal system and connective tissue: Secondary | ICD-10-CM | POA: Diagnosis not present

## 2014-11-25 DIAGNOSIS — Y9389 Activity, other specified: Secondary | ICD-10-CM | POA: Diagnosis not present

## 2014-11-25 DIAGNOSIS — Z87442 Personal history of urinary calculi: Secondary | ICD-10-CM | POA: Insufficient documentation

## 2014-11-25 DIAGNOSIS — Z7982 Long term (current) use of aspirin: Secondary | ICD-10-CM | POA: Diagnosis not present

## 2014-11-25 DIAGNOSIS — X58XXXA Exposure to other specified factors, initial encounter: Secondary | ICD-10-CM | POA: Diagnosis not present

## 2014-11-25 DIAGNOSIS — M25561 Pain in right knee: Secondary | ICD-10-CM

## 2014-11-25 DIAGNOSIS — Z8673 Personal history of transient ischemic attack (TIA), and cerebral infarction without residual deficits: Secondary | ICD-10-CM | POA: Diagnosis not present

## 2014-11-25 MED ORDER — HYDROCODONE-ACETAMINOPHEN 5-325 MG PO TABS
1.0000 | ORAL_TABLET | Freq: Once | ORAL | Status: AC
  Administered 2014-11-25: 1 via ORAL
  Filled 2014-11-25: qty 1

## 2014-11-25 MED ORDER — NAPROXEN 500 MG PO TABS: 500.0000 mg | ORAL_TABLET | Freq: Two times a day (BID) | ORAL | Status: DC

## 2014-11-25 MED ORDER — IBUPROFEN 800 MG PO TABS
800.0000 mg | ORAL_TABLET | Freq: Once | ORAL | Status: AC
Start: 2014-11-25 — End: 2014-11-25
  Administered 2014-11-25: 800 mg via ORAL
  Filled 2014-11-25: qty 1

## 2014-11-25 MED ORDER — HYDROCODONE-ACETAMINOPHEN 5-325 MG PO TABS: ORAL_TABLET | ORAL | Status: DC

## 2014-11-25 NOTE — Discharge Instructions (Signed)
Knee Pain Knee pain can be a result of an injury or other medical conditions. Treatment will depend on the cause of your pain. HOME CARE  Only take medicine as told by your doctor.  Keep a healthy weight. Being overweight can make the knee hurt more.  Stretch before exercising or playing sports.  If there is constant knee pain, change the way you exercise. Ask your doctor for advice.  Make sure shoes fit well. Choose the right shoe for the sport or activity.  Protect your knees. Wear kneepads if needed.  Rest when you are tired. GET HELP RIGHT AWAY IF:   Your knee pain does not stop.  Your knee pain does not get better.  Your knee joint feels hot to the touch.  You have a fever. MAKE SURE YOU:   Understand these instructions.  Will watch this condition.  Will get help right away if you are not doing well or get worse. Document Released: 10/16/2008 Document Revised: 10/12/2011 Document Reviewed: 10/16/2008 [facility] Patient Information 2015 [place], [place]. This information is not intended to replace advice given to you by your health care provider. Make sure you discuss any questions you have with your health care provider.

## 2014-11-25 NOTE — ED Notes (Signed)
1 month hx of R knee pain. Hx of arthroscopic sx bilaterally.  Switched gym and new routine and had increased pain.  Stayed in bed all day Thursday d/t pain. Has been able to "hop" around on it.  Denies fevers.

## 2014-11-25 NOTE — ED Notes (Signed)
Patient with no complaints at this time. Respirations even and unlabored. Skin warm/dry. Discharge instructions reviewed with patient at this time. Patient given opportunity to voice concerns/ask questions. Patient discharged at this time and left Emergency Department with steady gait.   

## 2014-11-25 NOTE — ED Provider Notes (Signed)
CSN: [id number]     Arrival date & time 11/25/14  1905 History  This chart was scribed for non-physician practitioner Kem Parkinson, PA-C working with Pattricia Boss, MD by Zola Button, ED Scribe. This patient was seen in room APFT20/[facility] and the patient's care was started at 7:48 PM.    Chief Complaint  Patient presents with  . Knee Pain   The history is provided by the patient. No language interpreter was used.    HPI Comments: Cathy Thomas is a 59 y.o. female with a hx of knee arthroscopy bilaterally in 2001, who presents to the Emergency Department complaining of gradual onset, intermittent, worsening right knee pain that started 1 month ago, but worsened starting yesterday. Patient reports increased swelling of her right knee recently. She has noticed popping and clicking in her knee, and she sometimes feels like her knee may give out. The pain is worsened with ambulation.  She notes that exercising seemed to improve the pain; however, she changed gyms and has a new trainer now with a new routine this past week. She started a more intensive step routine compared to her old one. She notes that she went shopping 2 days ago, which she believes made her pain worse. Patient has tried ibuprofen and her prescribed Lyrica but without relief. She denies fever and chills, calf pain, redness, and numbness. She also denies any falls.   Past Medical History  Diagnosis Date  . Fibromyalgia   . Kidney stone   . Stroke   . TIA (transient ischemic attack)    Past Surgical History  Procedure Laterality Date  . Abdominal hysterectomy    . Knee arthroscopy    . Cesarean section    . Stone extraction with basket  12/16/2011    Procedure: STONE EXTRACTION WITH BASKET;  Surgeon: Marissa Nestle, MD;  Location: [facility];  Service: Urology;  Laterality: Left;  . Balloon dilation  12/16/2011    Procedure: BALLOON DILATION;  Surgeon: Marissa Nestle, MD;  Location: [facility];  Service: Urology;  Laterality:  Left;   No family history on file. History  Substance Use Topics  . Smoking status: Never Smoker   . Smokeless tobacco: Not on file  . Alcohol Use: No   OB History    No data available     Review of Systems  Constitutional: Negative for fever and chills.  Musculoskeletal: Positive for arthralgias. Negative for joint swelling.  Skin: Negative for color change and wound.  Neurological: Negative for weakness and numbness.  All other systems reviewed and are negative.     Allergies  Amoxicillin  Home Medications   Prior to Admission medications   Medication Sig Start Date End Date Taking? Authorizing Provider  aspirin EC 81 MG tablet Take 81 mg by mouth daily.    Historical Provider, MD  ibuprofen (ADVIL,MOTRIN) 200 MG tablet Take 400 mg by mouth every 6 (six) hours as needed for mild pain or moderate pain.    Historical Provider, MD  ibuprofen (ADVIL,MOTRIN) 800 MG tablet Take 1 tablet (800 mg total) by mouth every 8 (eight) hours as needed for pain. Patient not taking: Reported on 06/15/2014 11/20/12   Milton Ferguson, MD  LYRICA 75 MG capsule Take 75-150 mg by mouth 2 (two) times daily. Take two capsules in the morning and one at bedtime 05/31/14   Historical Provider, MD  magnesium gluconate (MAGONATE) 500 MG tablet Take 500 mg by mouth daily.     Historical Provider, MD  ondansetron (ZOFRAN) 4 MG tablet Take 1 tablet (4 mg total) by mouth every 6 (six) hours. Patient not taking: Reported on 06/15/2014 06/28/13   Teressa Lower, MD  oxyCODONE-acetaminophen (PERCOCET/ROXICET) 5-325 MG per tablet Take 2 tablets by mouth every 4 (four) hours as needed for severe pain. Patient not taking: Reported on 06/15/2014 06/28/13   Teressa Lower, MD  pregabalin (LYRICA) 25 MG capsule Take 25-50 mg by mouth 2 (two) times daily. 2 in am and 1 at night    Historical Provider, MD  Vitamin D, Ergocalciferol, (DRISDOL) 50000 UNITS CAPS Take 50,000 Units by mouth every 7 (seven) days.     Historical  Provider, MD   BP 187/85 mmHg  Pulse 96  Temp(Src) 97.8 F (36.6 C) (Oral)  Resp 16  Ht 5\' 7"  (1.702 m)  Wt 260 lb (117.935 kg)  BMI 40.71 kg/m2  SpO2 97% Physical Exam  Constitutional: She is oriented to person, place, and time. She appears well-developed and well-nourished. No distress.  HENT:  Head: Normocephalic and atraumatic.  Neck: Normal range of motion. Neck supple.  Cardiovascular: Normal rate, regular rhythm, normal heart sounds and intact distal pulses.   No murmur heard. Pulmonary/Chest: Effort normal and breath sounds normal. No respiratory distress. She has no wheezes. She has no rales.  Musculoskeletal: Normal range of motion. She exhibits tenderness. She exhibits no edema.  Localized tenderness to the right lateral knee. Small effusion. No erythema,patellar crepitus or excessive warmth. Compartments are soft. DP pulses are brisk and symmetrical  Neurological: She is alert and oriented to person, place, and time. No cranial nerve deficit.  Sensation intact.  Skin: Skin is warm and dry. No rash noted. No erythema.  Psychiatric: She has a normal mood and affect. Her behavior is normal.  Nursing note and vitals reviewed.   ED Course  Procedures  DIAGNOSTIC STUDIES: Oxygen Saturation is 97% on room air, normal by my interpretation.    COORDINATION OF CARE: 7:55 PM-Discussed treatment plan which includes XR imaging with patient/guardian at bedside and patient/guardian agreed to plan.    Labs Review Labs Reviewed - No data to display  Imaging Review Dg Knee Complete 4 Views Right  11/25/2014   CLINICAL DATA:  Knee pain  EXAM: RIGHT KNEE - COMPLETE 4+ VIEW  COMPARISON:  None.  FINDINGS: Moderate degenerative change with joint space narrowing and spurring in the medial and lateral compartment and the patellofemoral joint. Small joint effusion. Negative for fracture.  IMPRESSION: Moderate degenerative change.  Negative for fracture.   Electronically Signed   By:  Franchot Gallo M.D.   On: 11/25/2014 21:08     EKG Interpretation None      MDM   Final diagnoses:  Knee pain, right   Pt with gradual right knee pain likely related to inflammatory changes.  No concerning sx for septic joint.  NV intact,  Pt is ambulatory.  Appears stable for d/c, agrees to ACE wrap as needed for support, close orthopedic f/u   I personally performed the services described in this documentation, which was scribed in my presence. The recorded information has been reviewed and is accurate.   Kem Parkinson, PA-C 11/26/14 2100  Pattricia Boss, MD 11/29/14 [phone]

## 2014-12-01 ENCOUNTER — Encounter (HOSPITAL_COMMUNITY): Payer: Self-pay | Admitting: *Deleted

## 2014-12-01 ENCOUNTER — Emergency Department (HOSPITAL_COMMUNITY)
Admission: EM | Admit: 2014-12-01 | Discharge: 2014-12-01 | Disposition: A | Discharge status: Home / Self Care | Payer: Medicare PPO | Attending: Emergency Medicine | Admitting: Emergency Medicine

## 2014-12-01 DIAGNOSIS — T3795XA Adverse effect of unspecified systemic anti-infective and antiparasitic, initial encounter: Secondary | ICD-10-CM | POA: Diagnosis not present

## 2014-12-01 DIAGNOSIS — Z880 Allergy status to penicillin: Secondary | ICD-10-CM | POA: Insufficient documentation

## 2014-12-01 DIAGNOSIS — Z8673 Personal history of transient ischemic attack (TIA), and cerebral infarction without residual deficits: Secondary | ICD-10-CM | POA: Insufficient documentation

## 2014-12-01 DIAGNOSIS — Z79899 Other long term (current) drug therapy: Secondary | ICD-10-CM | POA: Insufficient documentation

## 2014-12-01 DIAGNOSIS — Z791 Long term (current) use of non-steroidal anti-inflammatories (NSAID): Secondary | ICD-10-CM | POA: Diagnosis not present

## 2014-12-01 DIAGNOSIS — Z87442 Personal history of urinary calculi: Secondary | ICD-10-CM | POA: Diagnosis not present

## 2014-12-01 DIAGNOSIS — T7840XA Allergy, unspecified, initial encounter: Secondary | ICD-10-CM

## 2014-12-01 DIAGNOSIS — M797 Fibromyalgia: Secondary | ICD-10-CM | POA: Insufficient documentation

## 2014-12-01 DIAGNOSIS — Z7982 Long term (current) use of aspirin: Secondary | ICD-10-CM | POA: Insufficient documentation

## 2014-12-01 DIAGNOSIS — R21 Rash and other nonspecific skin eruption: Secondary | ICD-10-CM | POA: Diagnosis not present

## 2014-12-01 DIAGNOSIS — L299 Pruritus, unspecified: Secondary | ICD-10-CM | POA: Diagnosis not present

## 2014-12-01 MED ORDER — LEVOCETIRIZINE DIHYDROCHLORIDE 5 MG PO TABS: ORAL_TABLET | ORAL | Status: DC

## 2014-12-01 MED ORDER — METHYLPREDNISOLONE SODIUM SUCC 125 MG IJ SOLR
125.0000 mg | Freq: Once | INTRAMUSCULAR | Status: AC
  Administered 2014-12-01: 125 mg via INTRAVENOUS
  Filled 2014-12-01: qty 2

## 2014-12-01 MED ORDER — PREDNISONE 10 MG PO TABS: 20.0000 mg | ORAL_TABLET | Freq: Every day | ORAL | Status: DC

## 2014-12-01 MED ORDER — FAMOTIDINE 20 MG PO TABS
20.0000 mg | ORAL_TABLET | Freq: Once | ORAL | Status: AC
  Administered 2014-12-01: 20 mg via ORAL
  Filled 2014-12-01: qty 1

## 2014-12-01 NOTE — ED Provider Notes (Signed)
CSN: [id number]     Arrival date & time 12/01/14  0830 History   First MD Initiated Contact with Patient 12/01/14 [phone]     Chief Complaint  Patient presents with  . Allergic Reaction     (Consider location/radiation/quality/duration/timing/severity/associated sxs/prior Treatment) HPI Comments: Patient reports this problem started 4 days ago, after taking fluconazole 150 mg tablets. Patient had a similar episode with the splotches under the skin, the burning and itching of the hands and legs alone about 2 months ago. During this episode in the previous episode was no shortness of breath, no difficulty with breathing. Patient persisted emergency department today because previously it did not affect her mouth, and the burning and itching did not last as long as it did. The patient was unable to see her primary physician when this was at its peak, and presents to the emergency department for additional evaluation.  Patient is a 59 y.o. female presenting with allergic reaction. The history is provided by the patient.  Allergic Reaction Presenting symptoms: itching and rash   Presenting symptoms: no difficulty breathing, no swelling and no wheezing   Severity:  Moderate Prior episodes: similar symptoms 2 months ago with taking Fluconazole. Context: medications   Context: no food allergies, no insect bite/sting, no jewelry/metal, no new detergents/soaps and no poison ivy   Relieved by:  Nothing Ineffective treatments:  None tried   Past Medical History  Diagnosis Date  . Fibromyalgia   . Kidney stone   . Stroke   . TIA (transient ischemic attack)    Past Surgical History  Procedure Laterality Date  . Abdominal hysterectomy    . Knee arthroscopy    . Cesarean section    . Stone extraction with basket  12/16/2011    Procedure: STONE EXTRACTION WITH BASKET;  Surgeon: Marissa Nestle, MD;  Location: [facility];  Service: Urology;  Laterality: Left;  . Balloon dilation  12/16/2011     Procedure: BALLOON DILATION;  Surgeon: Marissa Nestle, MD;  Location: [facility];  Service: Urology;  Laterality: Left;   No family history on file. History  Substance Use Topics  . Smoking status: Never Smoker   . Smokeless tobacco: Not on file  . Alcohol Use: No   OB History    No data available     Review of Systems  Respiratory: Negative for wheezing.   Cardiovascular: Negative for chest pain.  Musculoskeletal: Positive for arthralgias.  Skin: Positive for itching and rash.      Allergies  Amoxicillin  Home Medications   Prior to Admission medications   Medication Sig Start Date End Date Taking? Authorizing Provider  aspirin EC 81 MG tablet Take 81 mg by mouth daily.    Historical Provider, MD  CALCIUM PO Take 2 tablets by mouth daily.    Historical Provider, MD  fluconazole (DIFLUCAN) 150 MG tablet Take 150 mg by mouth as needed (Yeast Infection In Ear).    Historical Provider, MD  HYDROcodone-acetaminophen (NORCO/VICODIN) 5-325 MG per tablet Take one-two tabs po q 4-6 hrs prn pain 11/25/14   Tammy Triplett, PA-C  LYRICA 75 MG capsule Take 150 mg by mouth 2 (two) times daily as needed (Patient always takes 2 daily but can take an extra capsule if necessary.).  05/31/14   Historical Provider, MD  magnesium gluconate (MAGONATE) 500 MG tablet Take 500 mg by mouth daily.     Historical Provider, MD  naproxen (NAPROSYN) 500 MG tablet Take 1 tablet (500 mg total) by  mouth 2 (two) times daily with a meal. 11/25/14   Tammy Triplett, PA-C  Vitamin D, Ergocalciferol, (DRISDOL) 50000 UNITS CAPS Take 50,000 Units by mouth every 7 (seven) days. Takes on Monday    Historical Provider, MD   BP 149/91 mmHg  Pulse 80  Temp(Src) 98.6 F (37 C) (Oral)  Resp 20  Ht 5\' 7"  (1.702 m)  Wt 260 lb (117.935 kg)  BMI 40.71 kg/m2  SpO2 97% Physical Exam  Constitutional: She is oriented to person, place, and time. She appears well-developed and well-nourished.  Non-toxic appearance. No distress.   HENT:  Head: Normocephalic.  Right Ear: Tympanic membrane and external ear normal.  Left Ear: Tympanic membrane and external ear normal.  Rash and small denuded area of the mucosa of the lower lip, and a lesion of the posterior tongue. Tongue not swollen. Airway patent.   Eyes: EOM and lids are normal. Pupils are equal, round, and reactive to light.  Neck: Normal range of motion. Neck supple. Carotid bruit is not present.  Cardiovascular: Normal rate, regular rhythm, normal heart sounds, intact distal pulses and normal pulses.   Pulmonary/Chest: Breath sounds normal. No respiratory distress. She has no wheezes. She has no rales.  Abdominal: Soft. Bowel sounds are normal. There is no tenderness. There is no guarding.  Musculoskeletal: Normal range of motion.  Lymphadenopathy:       Head (right side): No submandibular adenopathy present.       Head (left side): No submandibular adenopathy present.    She has no cervical adenopathy.  Neurological: She is alert and oriented to person, place, and time. She has normal strength. No cranial nerve deficit or sensory deficit.  Skin: Skin is warm and dry. She is not diaphoretic.  Red splotches under the skin of the palms of both hands.  Red splotches on the lower legs  Psychiatric: She has a normal mood and affect. Her speech is normal.  Nursing note and vitals reviewed.   ED Course  Procedures (including critical care time) Labs Review Labs Reviewed - No data to display  Imaging Review No results found.   EKG Interpretation None      MDM  Vital signs are well within normal limits. The pulse oximetry is 97-98% on room air. Within normal limits by my interpretation. The patient has had a similar reaction to condoms all in the past, but on this occasion she noted more problems with itching and burning of the hands and legs. And previously it did not involve this skin of her lip. The patient speaks in complete sentences of. She is in no  distress whatsoever. The patient was treated in the emergency department with intravenous steroid and an H2 blocker. I've encouraged the patient to use Allegra during the day, and reserve Benadryl for evening and night itching. I've encouraged her to refrain from the use of flow, so. She will be given a short taper of steroid. Patient is encouraged to return to the emergency department immediately if any changes, problems, or concerns.    Final diagnoses:  None    **I have reviewed nursing notes, vital signs, and all appropriate lab and imaging results for this patient.    Lily Kocher, PA-C 12/01/14 [id number]  Varney Biles, MD 12/01/14 [phone]

## 2014-12-01 NOTE — ED Notes (Signed)
Pt c/o allergic reaction from fluconazole 150 mg tablet that she started taking Tuesday, pt reports that she is itching, burning to hands, mouth, leg area, denies any sob but states that it is hard to swallow due to "sores" on her tongue, pt alert, airway intact,

## 2014-12-01 NOTE — Discharge Instructions (Signed)
Please do not take anymore of the fluconazole. Please use prednisone taper as prescribed. Use Xyzal each morning, use Benadryl in the evening or bedtime when needed for itching. Please return to the emergency department immediately if any changes, problems, or concerns. Allergies Allergies may happen from anything your body is sensitive to. This may be food, medicines, pollens, chemicals, and nearly anything around you in everyday life that produces allergens. An allergen is anything that causes an allergy producing substance. Heredity is often a factor in causing these problems. This means you may have some of the same allergies as your parents. Food allergies happen in all age groups. Food allergies are some of the most severe and life threatening. Some common food allergies are cow's milk, seafood, eggs, nuts, wheat, and soybeans. SYMPTOMS   Swelling around the mouth.  An itchy red rash or hives.  Vomiting or diarrhea.  Difficulty breathing. SEVERE ALLERGIC REACTIONS ARE LIFE-THREATENING. This reaction is called anaphylaxis. It can cause the mouth and throat to swell and cause difficulty with breathing and swallowing. In severe reactions only a trace amount of food (for example, peanut oil in a salad) may cause death within seconds. Seasonal allergies occur in all age groups. These are seasonal because they usually occur during the same season every year. They may be a reaction to molds, grass pollens, or tree pollens. Other causes of problems are house dust mite allergens, pet dander, and mold spores. The symptoms often consist of nasal congestion, a runny itchy nose associated with sneezing, and tearing itchy eyes. There is often an associated itching of the mouth and ears. The problems happen when you come in contact with pollens and other allergens. Allergens are the particles in the air that the body reacts to with an allergic reaction. This causes you to release allergic antibodies. Through a  chain of events, these eventually cause you to release histamine into the blood stream. Although it is meant to be protective to the body, it is this release that causes your discomfort. This is why you were given anti-histamines to feel better. If you are unable to pinpoint the offending allergen, it may be determined by skin or blood testing. Allergies cannot be cured but can be controlled with medicine. Hay fever is a collection of all or some of the seasonal allergy problems. It may often be treated with simple over-the-counter medicine such as diphenhydramine. Take medicine as directed. Do not drink alcohol or drive while taking this medicine. Check with your caregiver or package insert for child dosages. If these medicines are not effective, there are many new medicines your caregiver can prescribe. Stronger medicine such as nasal spray, eye drops, and corticosteroids may be used if the first things you try do not work well. Other treatments such as immunotherapy or desensitizing injections can be used if all else fails. Follow up with your caregiver if problems continue. These seasonal allergies are usually not life threatening. They are generally more of a nuisance that can often be handled using medicine. HOME CARE INSTRUCTIONS   If unsure what causes a reaction, keep a diary of foods eaten and symptoms that follow. Avoid foods that cause reactions.  If hives or rash are present:  Take medicine as directed.  You may use an over-the-counter antihistamine (diphenhydramine) for hives and itching as needed.  Apply cold compresses (cloths) to the skin or take baths in cool water. Avoid hot baths or showers. Heat will make a rash and itching worse.  If  you are severely allergic:  Following a treatment for a severe reaction, hospitalization is often required for closer follow-up.  Wear a medic-alert bracelet or necklace stating the allergy.  You and your family must learn how to give  adrenaline or use an anaphylaxis kit.  If you have had a severe reaction, always carry your anaphylaxis kit or EpiPen with you. Use this medicine as directed by your caregiver if a severe reaction is occurring. Failure to do so could have a fatal outcome. SEEK MEDICAL CARE IF:  You suspect a food allergy. Symptoms generally happen within 30 minutes of eating a food.  Your symptoms have not gone away within 2 days or are getting worse.  You develop new symptoms.  You want to retest yourself or your child with a food or drink you think causes an allergic reaction. Never do this if an anaphylactic reaction to that food or drink has happened before. Only do this under the care of a caregiver. SEEK IMMEDIATE MEDICAL CARE IF:   You have difficulty breathing, are wheezing, or have a tight feeling in your chest or throat.  You have a swollen mouth, or you have hives, swelling, or itching all over your body.  You have had a severe reaction that has responded to your anaphylaxis kit or an EpiPen. These reactions may return when the medicine has worn off. These reactions should be considered life threatening. MAKE SURE YOU:   Understand these instructions.  Will watch your condition.  Will get help right away if you are not doing well or get worse. Document Released: 10/13/2002 Document Revised: 11/14/2012 Document Reviewed: 03/19/2008 [facility] Patient Information 2015 [place], [place]. This information is not intended to replace advice given to you by your health care provider. Make sure you discuss any questions you have with your health care provider.

## 2014-12-18 ENCOUNTER — Ambulatory Visit (INDEPENDENT_AMBULATORY_CARE_PROVIDER_SITE_OTHER): Payer: Medicare PPO | Admitting: Orthopedic Surgery

## 2014-12-18 ENCOUNTER — Encounter: Payer: Self-pay | Admitting: Orthopedic Surgery

## 2014-12-18 VITALS — BP 136/86 | Ht 67.0 in | Wt 260.0 lb

## 2014-12-18 DIAGNOSIS — M1711 Unilateral primary osteoarthritis, right knee: Secondary | ICD-10-CM

## 2014-12-18 NOTE — Patient Instructions (Signed)
Joint Injection  Care After  Refer to this sheet in the next few days. These instructions provide you with information on caring for yourself after you have had a joint injection. Your caregiver also may give you more specific instructions. Your treatment has been planned according to current medical practices, but problems sometimes occur. Call your caregiver if you have any problems or questions after your procedure.  After any type of joint injection, it is not uncommon to experience:  · Soreness, swelling, or bruising around the injection site.  · Mild numbness, tingling, or weakness around the injection site caused by the numbing medicine used before or with the injection.  It also is possible to experience the following effects associated with the specific agent after injection:  · Iodine-based contrast agents:  ¨ Allergic reaction (itching, hives, widespread redness, and swelling beyond the injection site).  · Corticosteroids (These effects are rare.):  ¨ Allergic reaction.  ¨ Increased blood sugar levels (If you have diabetes and you notice that your blood sugar levels have increased, notify your caregiver).  ¨ Increased blood pressure levels.  ¨ Mood swings.  · Hyaluronic acid in the use of viscosupplementation.  ¨ Temporary heat or redness.  ¨ Temporary rash and itching.  ¨ Increased fluid accumulation in the injected joint.  These effects all should resolve within a day after your procedure.   HOME CARE INSTRUCTIONS  · Limit yourself to light activity the day of your procedure. Avoid lifting heavy objects, bending, stooping, or twisting.  · Take prescription or over-the-counter pain medication as directed by your caregiver.  · You may apply ice to your injection site to reduce pain and swelling the day of your procedure. Ice may be applied 03-04 times:  ¨ Put ice in a plastic bag.  ¨ Place a towel between your skin and the bag.  ¨ Leave the ice on for no longer than 15-20 minutes each time.  SEEK  IMMEDIATE MEDICAL CARE IF:   · Pain and swelling get worse rather than better or extend beyond the injection site.  · Numbness does not go away.  · Blood or fluid continues to leak from the injection site.  · You have chest pain.  · You have swelling of your face or tongue.  · You have trouble breathing or you become dizzy.  · You develop a fever, chills, or severe tenderness at the injection site that last longer than 1 day.  MAKE SURE YOU:  · Understand these instructions.  · Watch your condition.  · Get help right away if you are not doing well or if you get worse.  Document Released: 04/02/2011 Document Revised: 10/12/2011 Document Reviewed: 04/02/2011  [employer]® Patient Information ©2015 [employer]. This information is not intended to replace advice given to you by your health care provider. Make sure you discuss any questions you have with your health care provider.

## 2014-12-18 NOTE — Progress Notes (Signed)
Patient ID: Cathy Thomas, female   DOB: 1956-06-19, 59 y.o.   MRN: [id number]  Chief Complaint  Patient presents with  . Knee Pain    Right knee pain and swelling, no known injury     Cathy Thomas is a 59 y.o. female.   HPI 59 year old female status post bilateral knee arthroscopies presents for evaluation of increasing pain in the right knee which was brought on by change in gym and change in gym routine. She complains of pain swelling catching locking stiffness giving out. She describes dull throbbing burning stabbing and aching which is constant and is 4 out of 10 relieved by naproxen and also some prednisone which she had to take for an allergic reaction. She gets relief with rest and staying off of her feet and her symptoms worsen with walking standing getting in and out of the bathtub Review of Systems She reports hearing loss or ringing of the ears she has some fibromyalgia "" she has some neurologic symptoms of numbness and tingling burning in the legs related to that. Limb pain muscle weakness stiff joints back pain also noted.  Past Medical History  Diagnosis Date  . Fibromyalgia   . Kidney stone   . Stroke   . TIA (transient ischemic attack)     Past Surgical History  Procedure Laterality Date  . Abdominal hysterectomy    . Knee arthroscopy    . Cesarean section    . Stone extraction with basket  12/16/2011    Procedure: STONE EXTRACTION WITH BASKET;  Surgeon: Marissa Nestle, MD;  Location: [facility];  Service: Urology;  Laterality: Left;  . Balloon dilation  12/16/2011    Procedure: BALLOON DILATION;  Surgeon: Marissa Nestle, MD;  Location: [facility];  Service: Urology;  Laterality: Left;    No family history on file.  Social History History  Substance Use Topics  . Smoking status: Never Smoker   . Smokeless tobacco: Not on file  . Alcohol Use: No    Allergies  Allergen Reactions  . Amoxicillin Itching  . Fluconazole     Rash, burning sensation,      Current Outpatient Prescriptions  Medication Sig Dispense Refill  . CALCIUM PO Take 2 tablets by mouth daily.    Marland Kitchen levocetirizine (XYZAL) 5 MG tablet 1 po each am for itching 10 tablet 0  . LYRICA 75 MG capsule Take 150 mg by mouth 2 (two) times daily as needed (Patient always takes 2 daily but can take an extra capsule if necessary.).     Marland Kitchen magnesium gluconate (MAGONATE) 500 MG tablet Take 500 mg by mouth daily.     . Vitamin D, Ergocalciferol, (DRISDOL) 50000 UNITS CAPS Take 50,000 Units by mouth every 7 (seven) days. Takes on Monday     No current facility-administered medications for this visit.       Physical Exam Blood pressure 136/86, height 5\' 7"  (1.702 m), weight 260 lb (117.935 kg). Physical Exam The patient is well developed well nourished and well groomed. Orientation to person place and time is normal  Mood is pleasant. Ambulatory status she walks normally  She has tenderness along the medial and lateral joint lines. Her knee flexion is still good at 120 with her leg shape blocking some of her knee flexion she appears to come to full extension may be a little flexion contracture. Ligaments feel stable motor exam is normal scans intact pulses are good in sensations normal Left knee is asymptomatic no  swelling no tenderness no instability and normal motor function  Data Reviewed The x-rays reveal some mild degenerative changes in the knee  Assessment I think she does have some arthritis she may be having some meniscal signs recurrent however she would like an injection and medication so we will start there and can repeat this in a month. I am concerned about her weight Plan Procedure note right knee injection verbal consent was obtained to inject right knee joint  Timeout was completed to confirm the site of injection  The medications used were 40 mg of Depo-Medrol and 1% lidocaine 3 cc  Anesthesia was provided by ethyl chloride and the skin was prepped with  alcohol.  After cleaning the skin with alcohol a 20-gauge needle was used to inject the right knee joint. There were no complications. A sterile bandage was applied.

## 2015-04-24 ENCOUNTER — Encounter: Payer: Self-pay | Admitting: Women's Health

## 2015-04-24 ENCOUNTER — Ambulatory Visit (INDEPENDENT_AMBULATORY_CARE_PROVIDER_SITE_OTHER): Payer: Medicare PPO | Admitting: Women's Health

## 2015-04-24 VITALS — BP 158/92 | HR 80 | Wt 278.0 lb

## 2015-04-24 DIAGNOSIS — A499 Bacterial infection, unspecified: Secondary | ICD-10-CM | POA: Diagnosis not present

## 2015-04-24 DIAGNOSIS — R030 Elevated blood-pressure reading, without diagnosis of hypertension: Secondary | ICD-10-CM | POA: Diagnosis not present

## 2015-04-24 DIAGNOSIS — N760 Acute vaginitis: Secondary | ICD-10-CM | POA: Diagnosis not present

## 2015-04-24 DIAGNOSIS — IMO0001 Reserved for inherently not codable concepts without codable children: Secondary | ICD-10-CM

## 2015-04-24 DIAGNOSIS — N898 Other specified noninflammatory disorders of vagina: Secondary | ICD-10-CM

## 2015-04-24 DIAGNOSIS — B9689 Other specified bacterial agents as the cause of diseases classified elsewhere: Secondary | ICD-10-CM | POA: Insufficient documentation

## 2015-04-24 LAB — POCT WET PREP (WET MOUNT): CLUE CELLS WET PREP WHIFF POC: POSITIVE

## 2015-04-24 MED ORDER — CLINDAMYCIN PHOSPHATE 2 % VA CREA: 1.0000 | TOPICAL_CREAM | Freq: Every day | VAGINAL | Status: DC
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
## 2015-04-24 NOTE — Patient Instructions (Signed)
Call your primary care provider to schedule appointment to check blood pressure

## 2015-04-25 LAB — GC/CHLAMYDIA PROBE AMP
Chlamydia trachomatis, NAA: NEGATIVE
Neisseria gonorrhoeae by PCR: NEGATIVE

## 2015-05-20 ENCOUNTER — Emergency Department (HOSPITAL_COMMUNITY)
Admission: EM | Admit: 2015-05-20 | Discharge: 2015-05-20 | Disposition: A | Discharge status: Home / Self Care | Payer: Medicare PPO | Attending: Emergency Medicine | Admitting: Emergency Medicine

## 2015-05-20 ENCOUNTER — Encounter (HOSPITAL_COMMUNITY): Payer: Self-pay | Admitting: *Deleted

## 2015-05-20 ENCOUNTER — Emergency Department (HOSPITAL_COMMUNITY): Payer: Medicare PPO

## 2015-05-20 DIAGNOSIS — Z7982 Long term (current) use of aspirin: Secondary | ICD-10-CM | POA: Diagnosis not present

## 2015-05-20 DIAGNOSIS — Z79899 Other long term (current) drug therapy: Secondary | ICD-10-CM | POA: Insufficient documentation

## 2015-05-20 DIAGNOSIS — R0789 Other chest pain: Secondary | ICD-10-CM | POA: Diagnosis not present

## 2015-05-20 DIAGNOSIS — R0602 Shortness of breath: Secondary | ICD-10-CM | POA: Insufficient documentation

## 2015-05-20 DIAGNOSIS — Z8673 Personal history of transient ischemic attack (TIA), and cerebral infarction without residual deficits: Secondary | ICD-10-CM | POA: Insufficient documentation

## 2015-05-20 DIAGNOSIS — Z87442 Personal history of urinary calculi: Secondary | ICD-10-CM | POA: Diagnosis not present

## 2015-05-20 DIAGNOSIS — M797 Fibromyalgia: Secondary | ICD-10-CM | POA: Diagnosis not present

## 2015-05-20 DIAGNOSIS — Z880 Allergy status to penicillin: Secondary | ICD-10-CM | POA: Diagnosis not present

## 2015-05-20 DIAGNOSIS — R079 Chest pain, unspecified: Secondary | ICD-10-CM | POA: Diagnosis present

## 2015-05-20 LAB — CBC WITH DIFFERENTIAL/PLATELET
Basophils Absolute: 0.1 10*3/uL (ref 0.0–0.1)
Basophils Relative: 1 %
Eosinophils Absolute: 0.2 10*3/uL (ref 0.0–0.7)
Eosinophils Relative: 1 %
HCT: 39.4 % (ref 36.0–46.0)
HEMOGLOBIN: 12.7 g/dL (ref 12.0–15.0)
LYMPHS ABS: 3.1 10*3/uL (ref 0.7–4.0)
LYMPHS PCT: 24 %
MCH: 25.4 pg — AB (ref 26.0–34.0)
MCHC: 32.2 g/dL (ref 30.0–36.0)
MCV: 78.8 fL (ref 78.0–100.0)
MONOS PCT: 8 %
Monocytes Absolute: 1 10*3/uL (ref 0.1–1.0)
NEUTROS PCT: 66 %
Neutro Abs: 8.4 10*3/uL — ABNORMAL HIGH (ref 1.7–7.7)
Platelets: 336 10*3/uL (ref 150–400)
RBC: 5 MIL/uL (ref 3.87–5.11)
RDW: 14.9 % (ref 11.5–15.5)
WBC: 12.6 10*3/uL — ABNORMAL HIGH (ref 4.0–10.5)

## 2015-05-20 LAB — BASIC METABOLIC PANEL
Anion gap: 9 (ref 5–15)
BUN: 15 mg/dL (ref 6–20)
CALCIUM: 9.3 mg/dL (ref 8.9–10.3)
CO2: 29 mmol/L (ref 22–32)
Chloride: 101 mmol/L (ref 101–111)
Creatinine, Ser: 0.72 mg/dL (ref 0.44–1.00)
GFR calc Af Amer: >60 mL/min (ref >60)
GFR calc non Af Amer: >60 mL/min (ref >60)
Glucose, Bld: 100 mg/dL — ABNORMAL HIGH (ref 65–99)
Potassium: 3.2 mmol/L — ABNORMAL LOW (ref 3.5–5.1)
SODIUM: 139 mmol/L (ref 135–145)

## 2015-05-20 LAB — D-DIMER, QUANTITATIVE (NOT AT ARMC): D DIMER QUANT: 0.4 ug{FEU}/mL (ref 0.00–0.48)

## 2015-05-20 LAB — TROPONIN I

## 2015-05-20 MED ORDER — MORPHINE SULFATE (PF) 2 MG/ML IV SOLN
4.0000 mg | Freq: Once | INTRAVENOUS | Status: DC
  Filled 2015-05-20: qty 2

## 2015-05-20 NOTE — ED Notes (Signed)
Pt c/o central chest pain x 1 week that is not getting any better; pt states she can usually exercise and the pain goes away but this time is different; pt describes the pain as a pressure and states she feels like "I can't put one foot in front of the other"

## 2015-05-20 NOTE — ED Provider Notes (Signed)
CSN: [id number]     Arrival date & time 05/20/15  1942 History  By signing my name below, I, Helane Gunther, attest that this documentation has been prepared under the direction and in the presence of Forde Dandy, MD. Electronically Signed: Helane Gunther, ED Scribe. 05/20/2015. 9:58 PM.    Chief Complaint  Patient presents with  . Chest Pain   The history is provided by the patient. No language interpreter was used.   HPI Comments: Cathy Thomas is a 59 y.o. female who presents to the Emergency Department complaining of constant, worsening, pressure-like chest pain for one 1 week ago.  Pain has been constant and unremitting for one week. She reports associated SOB, exacerbated with activity. States she has had symptoms like this before, at least once a year. Reports her primary care physician has evaluated this and told her it was a part of her fibromyalgia. She notes relief of the pain with lying on her side and with pressing a pillow to her chest.  Notes pain usually improved with exercise, but recently it seems to be worse with it. Pt denies fever, chills, abdominal pain, leg swelling, and n/v. Denies cough, URI symptoms, diaphoresis, syncope or near syncope. No history HLD, CAD, DM or tobacco use.  Past Medical History  Diagnosis Date  . Fibromyalgia   . Kidney stone   . Stroke ([place])   . TIA (transient ischemic attack)    Past Surgical History  Procedure Laterality Date  . Abdominal hysterectomy    . Knee arthroscopy    . Cesarean section    . Stone extraction with basket  12/16/2011    Procedure: STONE EXTRACTION WITH BASKET;  Surgeon: Marissa Nestle, MD;  Location: [facility];  Service: Urology;  Laterality: Left;  . Balloon dilation  12/16/2011    Procedure: BALLOON DILATION;  Surgeon: Marissa Nestle, MD;  Location: [facility];  Service: Urology;  Laterality: Left;   Family History  Problem Relation Age of Onset  . Cancer Mother     ovarian  . Hypertension Mother   . Heart  disease Mother   . Diabetes Mother   . Cancer Father     lung, prostate  . Hypertension Father    Social History  Substance Use Topics  . Smoking status: Never Smoker   . Smokeless tobacco: None  . Alcohol Use: No   OB History    No data available     Review of Systems  Constitutional: Negative for fever and chills.  Respiratory: Positive for shortness of breath.   Cardiovascular: Positive for chest pain. Negative for leg swelling.  Gastrointestinal: Negative for nausea, vomiting and abdominal pain.  All other systems reviewed and are negative.   Allergies  Amoxicillin and Fluconazole  Home Medications   Prior to Admission medications   Medication Sig Start Date End Date Taking? Authorizing Provider  aspirin EC 81 MG tablet Take 81 mg by mouth daily.   Yes Historical Provider, MD  CALCIUM PO Take 2 tablets by mouth daily.   Yes Historical Provider, MD  hydrochlorothiazide (HYDRODIURIL) 25 MG tablet Take 25 mg by mouth daily.   Yes Historical Provider, MD  LYRICA 75 MG capsule Take 150 mg by mouth 2 (two) times daily as needed (Patient always takes 2 daily but can take an extra capsule if necessary.).  05/31/14  Yes Historical Provider, MD  magnesium gluconate (MAGONATE) 500 MG tablet Take 500 mg by mouth daily.    Yes Historical  Provider, MD  Vitamin D, Ergocalciferol, (DRISDOL) 50000 UNITS CAPS Take 50,000 Units by mouth every 7 (seven) days. Takes on Monday   Yes Historical Provider, MD   BP 140/75 mmHg  Pulse 86  Temp(Src) 98.4 F (36.9 C) (Oral)  Resp 18  Ht 5\' 7"  (1.702 m)  Wt 270 lb (122.471 kg)  BMI 42.28 kg/m2  SpO2 100% Physical Exam Physical Exam  Nursing note and vitals reviewed. Constitutional: Well developed, well nourished, non-toxic, and in no acute distress Head: Normocephalic and atraumatic.  Mouth/Throat: Oropharynx is clear and moist.  Neck: Normal range of motion. Neck supple.  Cardiovascular: Normal rate and regular rhythm.  No  edema. Pulmonary/Chest: Effort normal and breath sounds normal. No chest wall tenderness. Abdominal: Soft. There is no tenderness. There is no rebound and no guarding.  Musculoskeletal: Normal range of motion.  Neurological: Alert, no facial droop, fluent speech, moves all extremities symmetrically Skin: Skin is warm and dry.  Psychiatric: Cooperative  ED Course  Procedures  DIAGNOSTIC STUDIES: Oxygen Saturation is 100% on RA, normal by my interpretation.    COORDINATION OF CARE: 9:21 PM - Discussed normal EKG and chest XR. Discussed plans to order diagnostic studies and pain medication. Pt advised of plan for treatment and pt agrees.  Labs Review Labs Reviewed  CBC WITH DIFFERENTIAL/PLATELET - Abnormal; Notable for the following:    WBC 12.6 (*)    MCH 25.4 (*)    Neutro Abs 8.4 (*)    All other components within normal limits  BASIC METABOLIC PANEL - Abnormal; Notable for the following:    Potassium 3.2 (*)    Glucose, Bld 100 (*)    All other components within normal limits  TROPONIN I  D-DIMER, QUANTITATIVE (NOT AT [facility])    Imaging Review Dg Chest 2 View  05/20/2015  CLINICAL DATA:  Acute onset of centralized chest pain and generalized chest tightness. Initial encounter. EXAM: CHEST  2 VIEW COMPARISON:  Chest radiograph performed 06/15/2014 FINDINGS: The lungs are well-aerated and clear. There is no evidence of focal opacification, pleural effusion or pneumothorax. The heart is normal in size; the mediastinal contour is within normal limits. No acute osseous abnormalities are seen. IMPRESSION: No acute cardiopulmonary process seen. Electronically Signed   By: Garald Balding M.D.   On: 05/20/2015 21:18   I have personally reviewed and evaluated these images and lab results as part of my medical decision-making.   EKG Interpretation   Date/Time:  Monday May 20 2015 19:45:38 EDT Ventricular Rate:  97 PR Interval:  146 QRS Duration: 84 QT Interval:  376 QTC  Calculation: 477 R Axis:   35 Text Interpretation:  Normal sinus rhythm Normal ECG No significant change  since last tracing Reconfirmed by Prisilla Kocsis MD, [place] [phone]) on 05/21/2015  12:43:44 PM      MDM   Final diagnoses:  Chest discomfort    In short, this is a 59 year old female with history of HTN and fibromyalgia who presents with one week of continuous chest pressure. Well appearing and in no acute distress on presentation. Vital signs are not concerning. She has unremarkable cardiopulmonary exam. EKG is not ischemic and troponin x 1 negative. Given continuous chest pain for one week with single normal troponin, did not feel presentation consistent with ACS. Serial troponins would be unhelpful. No major risk factors for PE but does report DOE. D-dimer is negative and felt ruled out for PE. CXR showing no acute cardiopulmonary processes. Heart Score of 2  for age and risk factors of HTN, family history. Low risk of MACE and should continue any further cardiac work-up with PCP as outpatient as needed. Ambulated the patient in the ED, and she is without hypoxia, tachypnea, or worsening of symptoms. Felt she is appropriate for discharge. She will follow-up closely with PCP this week for have further monitoring and work-up. Strict return instructions reviewed. She expressed understanding of all discharge instructions and felt comfortable with the plan of care.  I personally performed the services described in this documentation, which was scribed in my presence. The recorded information has been reviewed and is accurate.   Forde Dandy, MD 05/21/15 [phone]

## 2015-05-20 NOTE — Discharge Instructions (Signed)
Return without fail for worsening symptoms, including worsening pain, difficulty breathing, passing out, severe fatigue, or any other symptoms concerning to you. Please see your PCP this week to make sure your symptoms are improving.  Nonspecific Chest Pain It is often hard to find the cause of chest pain. There is always a chance that your pain could be related to something serious, such as a heart attack or a blood clot in your lungs. Chest pain can also be caused by conditions that are not life-threatening. If you have chest pain, it is very important to follow up with your doctor.  HOME CARE  If you were prescribed an antibiotic medicine, finish it all even if you start to feel better.  Avoid any activities that cause chest pain.  Do not use any tobacco products, including cigarettes, chewing tobacco, or electronic cigarettes. If you need help quitting, ask your doctor.  Do not drink alcohol.  Take medicines only as told by your doctor.  Keep all follow-up visits as told by your doctor. This is important. This includes any further testing if your chest pain does not go away.  Your doctor may tell you to keep your head raised (elevated) while you sleep.  Make lifestyle changes as told by your doctor. These may include:  Getting regular exercise. Ask your doctor to suggest some activities that are safe for you.  Eating a heart-healthy diet. Your doctor or a diet specialist (dietitian) can help you to learn healthy eating options.  Maintaining a healthy weight.  Managing diabetes, if necessary.  Reducing stress. GET HELP IF:  Your chest pain does not go away, even after treatment.  You have a rash with blisters on your chest.  You have a fever. GET HELP RIGHT AWAY IF:  Your chest pain is worse.  You have an increasing cough, or you cough up blood.  You have severe belly (abdominal) pain.  You feel extremely weak.  You pass out (faint).  You have chills.  You have  sudden, unexplained chest discomfort.  You have sudden, unexplained discomfort in your arms, back, neck, or jaw.  You have shortness of breath at any time.  You suddenly start to sweat, or your skin gets clammy.  You feel nauseous.  You vomit.  You suddenly feel light-headed or dizzy.  Your heart begins to beat quickly, or it feels like it is skipping beats. These symptoms may be an emergency. Do not wait to see if the symptoms will go away. Get medical help right away. Call your local emergency services (911 in the [place] Do not drive yourself to the hospital.   This information is not intended to replace advice given to you by your health care provider. Make sure you discuss any questions you have with your health care provider.   Document Released: 01/06/2008 Document Revised: 08/10/2014 Document Reviewed: 02/23/2014 Elsevier Interactive Patient Education [employer].

## 2015-05-20 NOTE — ED Notes (Signed)
Patient tolerated ambulation well sp02 maintained at 95.

## 2015-06-11 ENCOUNTER — Other Ambulatory Visit: Payer: Self-pay | Admitting: Obstetrics and Gynecology

## 2015-06-11 ENCOUNTER — Other Ambulatory Visit (HOSPITAL_COMMUNITY): Payer: Self-pay | Admitting: Nurse Practitioner

## 2015-06-11 DIAGNOSIS — Z1231 Encounter for screening mammogram for malignant neoplasm of breast: Secondary | ICD-10-CM

## 2015-06-24 ENCOUNTER — Ambulatory Visit (HOSPITAL_COMMUNITY)
Admission: RE | Admit: 2015-06-24 | Discharge: 2015-06-24 | Disposition: A | Discharge status: Home / Self Care | Payer: Medicare PPO | Source: Ambulatory Visit | Attending: Nurse Practitioner | Admitting: Nurse Practitioner

## 2015-06-24 DIAGNOSIS — Z1231 Encounter for screening mammogram for malignant neoplasm of breast: Secondary | ICD-10-CM | POA: Diagnosis present

## 2015-10-21 ENCOUNTER — Emergency Department (HOSPITAL_COMMUNITY): Payer: Medicare PPO

## 2015-10-21 ENCOUNTER — Encounter (HOSPITAL_COMMUNITY): Payer: Self-pay | Admitting: Emergency Medicine

## 2015-10-21 ENCOUNTER — Emergency Department (HOSPITAL_COMMUNITY)
Admission: EM | Admit: 2015-10-21 | Discharge: 2015-10-21 | Disposition: A | Discharge status: Home / Self Care | Payer: Medicare PPO | Attending: Emergency Medicine | Admitting: Emergency Medicine

## 2015-10-21 DIAGNOSIS — H6122 Impacted cerumen, left ear: Secondary | ICD-10-CM | POA: Diagnosis not present

## 2015-10-21 DIAGNOSIS — Z8673 Personal history of transient ischemic attack (TIA), and cerebral infarction without residual deficits: Secondary | ICD-10-CM | POA: Diagnosis not present

## 2015-10-21 DIAGNOSIS — R202 Paresthesia of skin: Secondary | ICD-10-CM | POA: Diagnosis not present

## 2015-10-21 DIAGNOSIS — R209 Unspecified disturbances of skin sensation: Secondary | ICD-10-CM

## 2015-10-21 DIAGNOSIS — Z79899 Other long term (current) drug therapy: Secondary | ICD-10-CM | POA: Insufficient documentation

## 2015-10-21 DIAGNOSIS — Z7982 Long term (current) use of aspirin: Secondary | ICD-10-CM | POA: Insufficient documentation

## 2015-10-21 LAB — COMPREHENSIVE METABOLIC PANEL
ALBUMIN: 4 g/dL (ref 3.5–5.0)
ALT: 22 U/L (ref 14–54)
ANION GAP: 8 (ref 5–15)
AST: 21 U/L (ref 15–41)
Alkaline Phosphatase: 76 U/L (ref 38–126)
BUN: 15 mg/dL (ref 6–20)
CHLORIDE: 104 mmol/L (ref 101–111)
CO2: 29 mmol/L (ref 22–32)
Calcium: 9.2 mg/dL (ref 8.9–10.3)
Creatinine, Ser: 0.67 mg/dL (ref 0.44–1.00)
GFR calc Af Amer: >60 mL/min (ref >60)
GLUCOSE: 94 mg/dL (ref 65–99)
POTASSIUM: 3.7 mmol/L (ref 3.5–5.1)
Sodium: 141 mmol/L (ref 135–145)
Total Bilirubin: 0.7 mg/dL (ref 0.3–1.2)
Total Protein: 7.6 g/dL (ref 6.5–8.1)

## 2015-10-21 LAB — URINALYSIS, ROUTINE W REFLEX MICROSCOPIC
Bilirubin Urine: NEGATIVE
Glucose, UA: NEGATIVE mg/dL
Hgb urine dipstick: NEGATIVE
KETONES UR: NEGATIVE mg/dL
LEUKOCYTES UA: NEGATIVE
NITRITE: NEGATIVE
PROTEIN: NEGATIVE mg/dL
Specific Gravity, Urine: 1.015 (ref 1.005–1.030)
pH: 6 (ref 5.0–8.0)

## 2015-10-21 LAB — CBC
HCT: 38.8 % (ref 36.0–46.0)
Hemoglobin: 12.5 g/dL (ref 12.0–15.0)
MCH: 25.7 pg — ABNORMAL LOW (ref 26.0–34.0)
MCHC: 32.2 g/dL (ref 30.0–36.0)
MCV: 79.7 fL (ref 78.0–100.0)
Platelets: 315 10*3/uL (ref 150–400)
RBC: 4.87 MIL/uL (ref 3.87–5.11)
RDW: 14.8 % (ref 11.5–15.5)
WBC: 11 10*3/uL — ABNORMAL HIGH (ref 4.0–10.5)

## 2015-10-21 LAB — PROTIME-INR
INR: 0.95 (ref 0.00–1.49)
PROTHROMBIN TIME: 12.9 s (ref 11.6–15.2)

## 2015-10-21 LAB — DIFFERENTIAL
BASOS ABS: 0 10*3/uL (ref 0.0–0.1)
BASOS PCT: 0 %
EOS ABS: 0.2 10*3/uL (ref 0.0–0.7)
Eosinophils Relative: 2 %
Lymphocytes Relative: 22 %
Lymphs Abs: 2.4 10*3/uL (ref 0.7–4.0)
Monocytes Absolute: 0.7 10*3/uL (ref 0.1–1.0)
Monocytes Relative: 6 %
NEUTROS PCT: 70 %
Neutro Abs: 7.6 10*3/uL (ref 1.7–7.7)

## 2015-10-21 LAB — I-STAT CHEM 8, ED
BUN: 16 mg/dL (ref 6–20)
Calcium, Ion: 1.14 mmol/L (ref 1.12–1.23)
Chloride: 101 mmol/L (ref 101–111)
Creatinine, Ser: 0.8 mg/dL (ref 0.44–1.00)
Glucose, Bld: 89 mg/dL (ref 65–99)
HCT: 41 % (ref 36.0–46.0)
HEMOGLOBIN: 13.9 g/dL (ref 12.0–15.0)
POTASSIUM: 3.6 mmol/L (ref 3.5–5.1)
Sodium: 143 mmol/L (ref 135–145)
TCO2: 29 mmol/L (ref 0–100)

## 2015-10-21 LAB — APTT: APTT: 25 s (ref 24–37)

## 2015-10-21 LAB — RAPID URINE DRUG SCREEN, HOSP PERFORMED
Amphetamines: NOT DETECTED
BARBITURATES: NOT DETECTED
Benzodiazepines: NOT DETECTED
COCAINE: NOT DETECTED
OPIATES: NOT DETECTED
TETRAHYDROCANNABINOL: NOT DETECTED

## 2015-10-21 LAB — CBG MONITORING, ED: GLUCOSE-CAPILLARY: 72 mg/dL (ref 65–99)

## 2015-10-21 LAB — I-STAT TROPONIN, ED: TROPONIN I, POC: 0 ng/mL (ref 0.00–0.08)

## 2015-10-21 LAB — ETHANOL

## 2015-10-21 MED ORDER — HYDROGEN PEROXIDE 3 % EX SOLN
CUTANEOUS | Status: AC
  Administered 2015-10-21: 20:00:00
  Filled 2015-10-21: qty 473

## 2015-10-21 NOTE — ED Notes (Signed)
MD at bedside. 

## 2015-10-21 NOTE — ED Provider Notes (Signed)
CSN: [id number]     Arrival date & time 10/21/15  1628 History   First MD Initiated Contact with Patient 10/21/15 1704     Chief Complaint  Patient presents with  . Numbness     (Consider location/radiation/quality/duration/timing/severity/associated sxs/prior Treatment) HPI Comments: Patient with history of fibromyalgia, kidney stone, previous TIA presenting with left sided face "fullness" since last night. She describes pressure in her left ear. This reminds her when she had a fungal infection of her left ear that required intervention by ENT. This morning around 11 AM she noticed that her left side of her face felt numb and tingly and upper lip felt numb and tingly as well. This has been constant. The sensation to her left side of her face feels different than the right. There is no focal weakness in her arms or legs. There is no difficulty speaking or swallowing. There is no fever, chills, nausea or vomiting. No chest pain or shortness of breath. No abdominal pain. She denies any ear pain or drainage.  The history is provided by the patient.    Past Medical History  Diagnosis Date  . Fibromyalgia   . Kidney stone   . Stroke ([place])   . TIA (transient ischemic attack)    Past Surgical History  Procedure Laterality Date  . Abdominal hysterectomy    . Knee arthroscopy    . Cesarean section    . Stone extraction with basket  12/16/2011    Procedure: STONE EXTRACTION WITH BASKET;  Surgeon: Marissa Nestle, MD;  Location: [facility];  Service: Urology;  Laterality: Left;  . Balloon dilation  12/16/2011    Procedure: BALLOON DILATION;  Surgeon: Marissa Nestle, MD;  Location: [facility];  Service: Urology;  Laterality: Left;   Family History  Problem Relation Age of Onset  . Cancer Mother     ovarian  . Hypertension Mother   . Heart disease Mother   . Diabetes Mother   . Cancer Father     lung, prostate  . Hypertension Father    Social History  Substance Use Topics  . Smoking  status: Never Smoker   . Smokeless tobacco: None  . Alcohol Use: No   OB History    No data available     Review of Systems  Constitutional: Negative for fever, activity change and appetite change.  HENT: Positive for ear pain and facial swelling.   Eyes: Negative for visual disturbance.  Respiratory: Negative for cough and shortness of breath.   Cardiovascular: Negative for chest pain and leg swelling.  Gastrointestinal: Negative for nausea, vomiting and abdominal pain.  Genitourinary: Negative for dysuria, hematuria, vaginal bleeding and vaginal discharge.  Musculoskeletal: Negative for myalgias and arthralgias.  Neurological: Positive for numbness. Negative for dizziness, facial asymmetry, weakness and headaches.  Psychiatric/Behavioral: Negative for suicidal ideas.   A complete 10 system review of systems was obtained and all systems are negative except as noted in the HPI and PMH.      Allergies  Amoxicillin and Fluconazole  Home Medications   Prior to Admission medications   Medication Sig Start Date End Date Taking? Authorizing Provider  aspirin EC 81 MG tablet Take 81 mg by mouth daily.   Yes Historical Provider, MD  hydrochlorothiazide (HYDRODIURIL) 25 MG tablet Take 25 mg by mouth daily.   Yes Historical Provider, MD  LYRICA 75 MG capsule Take 150 mg by mouth 2 (two) times daily as needed (Patient always takes 2 daily but can take  an extra capsule if necessary.).  05/31/14  Yes Historical Provider, MD  magnesium gluconate (MAGONATE) 500 MG tablet Take 500 mg by mouth daily.    Yes Historical Provider, MD  Vitamin D, Ergocalciferol, (DRISDOL) 50000 UNITS CAPS Take 50,000 Units by mouth every 7 (seven) days. Takes on Monday   Yes Historical Provider, MD   BP 146/68 mmHg  Pulse 76  Temp(Src) 98 F (36.7 C) (Oral)  Resp 16  Ht 5\' 7"  (1.702 m)  Wt 260 lb (117.935 kg)  BMI 40.71 kg/m2  SpO2 98% Physical Exam  Constitutional: She is oriented to person, place, and  time. She appears well-developed and well-nourished. No distress.  HENT:  Head: Normocephalic and atraumatic.  Mouth/Throat: Oropharynx is clear and moist. No oropharyngeal exudate.  No tragus or mastoid pain. Cerumen impaction left ear. Right tympanic membrane normal.  Eyes: Conjunctivae and EOM are normal. Pupils are equal, round, and reactive to light.  Neck: Normal range of motion. Neck supple.  No meningismus.  Cardiovascular: Normal rate, regular rhythm, normal heart sounds and intact distal pulses.   No murmur heard. Pulmonary/Chest: Effort normal and breath sounds normal. No respiratory distress.  Abdominal: Soft. There is no tenderness. There is no rebound and no guarding.  Musculoskeletal: Normal range of motion. She exhibits no edema or tenderness.  Neurological: She is alert and oriented to person, place, and time. No cranial nerve deficit. She exhibits normal muscle tone. Coordination normal.  No ataxia on finger to nose bilaterally. No pronator drift. 5/5 strength throughout. CN 2-12 intact.Equal grip strength. Sensation intact Throughout except subjectively decreased to left side of face  Skin: Skin is warm.  Psychiatric: She has a normal mood and affect. Her behavior is normal.  Nursing note and vitals reviewed.   ED Course  Procedures (including critical care time) Labs Review Labs Reviewed  CBC - Abnormal; Notable for the following:    WBC 11.0 (*)    MCH 25.7 (*)    All other components within normal limits  ETHANOL  PROTIME-INR  APTT  DIFFERENTIAL  COMPREHENSIVE METABOLIC PANEL  URINE RAPID DRUG SCREEN, HOSP PERFORMED  URINALYSIS, ROUTINE W REFLEX MICROSCOPIC (NOT AT [facility])  I-STAT CHEM 8, ED  I-STAT TROPOININ, ED  CBG MONITORING, ED    Imaging Review Ct Head Wo Contrast  10/21/2015  CLINICAL DATA:  LEFT-sided facial fullness up. Dizziness and lightheadedness EXAM: CT HEAD WITHOUT CONTRAST TECHNIQUE: Contiguous axial images were obtained from the base of  the skull through the vertex without intravenous contrast. COMPARISON:  Head CT 06/15/2014 FINDINGS: No acute intracranial hemorrhage. No focal mass lesion. No CT evidence of acute infarction. No midline shift or mass effect. No hydrocephalus. Basilar cisterns are patent. Paranasal sinuses and  mastoid air cells are clear. IMPRESSION: No acute intracranial findings.  No change from prior. Electronically Signed   By: Suzy Bouchard M.D.   On: 10/21/2015 18:19   Mr Brain Wo Contrast  10/21/2015  CLINICAL DATA:  Left facial numbness for 1 day. EXAM: MRI HEAD WITHOUT CONTRAST TECHNIQUE: Multiplanar, multiecho pulse sequences of the brain and surrounding structures were obtained without intravenous contrast. COMPARISON:  Head CT 10/21/2015 and MRI 05/16/2010 FINDINGS: There is no evidence of acute infarct, intracranial hemorrhage, mass, midline shift, or extra-axial fluid collection. Ventricles and sulci are normal. No significant cerebral white matter disease is seen. Orbits are unremarkable. No significant inflammatory changes seen in the paranasal sinuses. There is a small left mastoid effusion. Major intracranial vascular flow voids are  preserved. IMPRESSION: Unremarkable appearance of the brain for age. No acute abnormality identified. Electronically Signed   By: Logan Bores M.D.   On: 10/21/2015 18:55   I have personally reviewed and evaluated these images and lab results as part of my medical decision-making.   EKG Interpretation   Date/Time:  Monday October 21 2015 17:24:53 EDT Ventricular Rate:  85 PR Interval:  152 QRS Duration: 88 QT Interval:  390 QTC Calculation: 464 R Axis:   37 Text Interpretation:  Sinus rhythm No significant change was found  Confirmed by Wyvonnia Dusky  MD, Demaris Leavell [phone]) on 10/21/2015 5:35:04 PM      MDM   Final diagnoses:  Cerumen impaction, left  Facial paresthesia   left ear and facial fullness since last night. Cerumen impaction on exam. Numbness to left side  of face with dizziness and off balance feeling since 11 AM. Code stroke not activated due to minimal deficits and delayed presentation.  Cerumen removal from left ear. Patient feels much improved and pressure is resolved.  Labs are unremarkable. Imaging is negative for acute pathology. No infarct on MRI.  Doubt CVA or TIA. Patient's facial fullness and paresthesias have resolved. She has no facial or other motor weakness. She feels back to baseline and does still has some dizziness from the ear irrigation. She has ENT follow-up in 3 days.  Doubt CVA or TIA. Continue aspirin. Return precautions discussed.  Ezequiel Essex, MD 10/22/15 1116

## 2015-10-21 NOTE — Discharge Instructions (Signed)
Cerumen Impaction There is no evidence of stroke. Follow up with your ENT doctor. Return to the ED if you develop new or worsening symptoms. The structures of the external ear canal secrete a waxy substance known as cerumen. Excess cerumen can build up in the ear canal, causing a condition known as cerumen impaction. Cerumen impaction can cause ear pain and disrupt the function of the ear. The rate of cerumen production differs for each individual. In certain individuals, the configuration of the ear canal may decrease his or her ability to naturally remove cerumen. CAUSES Cerumen impaction is caused by excessive cerumen production or buildup. RISK FACTORS  Frequent use of swabs to clean ears.  Having narrow ear canals.  Having eczema.  Being dehydrated. SIGNS AND SYMPTOMS  Diminished hearing.  Ear drainage.  Ear pain.  Ear itch. TREATMENT Treatment may involve:  Over-the-counter or prescription ear drops to soften the cerumen.  Removal of cerumen by a health care provider. This may be done with:  Irrigation with warm water. This is the most common method of removal.  Ear curettes and other instruments.  Surgery. This may be done in severe cases. HOME CARE INSTRUCTIONS  Take medicines only as directed by your health care provider.  Do not insert objects into the ear with the intent of cleaning the ear. PREVENTION  Do not insert objects into the ear, even with the intent of cleaning the ear. Removing cerumen as a part of normal hygiene is not necessary, and the use of swabs in the ear canal is not recommended.  Drink enough water to keep your urine clear or pale yellow.  Control your eczema if you have it. SEEK MEDICAL CARE IF:  You develop ear pain.  You develop bleeding from the ear.  The cerumen does not clear after you use ear drops as directed.   This information is not intended to replace advice given to you by your health care provider. Make sure you  discuss any questions you have with your health care provider.   Document Released: 08/27/2004 Document Revised: 08/10/2014 Document Reviewed: 03/06/2015 Elsevier Interactive Patient Education [employer].

## 2015-10-21 NOTE — ED Notes (Signed)
Pt reports LT sided face "fullness." States it feels like a lot of pressure. Pt also reports mild numbness to LT side of face that began at 1100 today. Also reports numbness to LT side of upper lip. Pt also c/o dizziness/lightheadedness.

## 2016-05-25 ENCOUNTER — Other Ambulatory Visit (HOSPITAL_COMMUNITY): Payer: Self-pay | Admitting: Nurse Practitioner

## 2016-05-25 DIAGNOSIS — Z1231 Encounter for screening mammogram for malignant neoplasm of breast: Secondary | ICD-10-CM

## 2016-06-24 ENCOUNTER — Ambulatory Visit (HOSPITAL_COMMUNITY): Payer: Medicare PPO

## 2016-07-01 ENCOUNTER — Emergency Department (HOSPITAL_COMMUNITY)
Admission: EM | Admit: 2016-07-01 | Discharge: 2016-07-01 | Disposition: A | Discharge status: Home / Self Care | Payer: Medicare PPO | Attending: Emergency Medicine | Admitting: Emergency Medicine

## 2016-07-01 ENCOUNTER — Emergency Department (HOSPITAL_COMMUNITY): Payer: Medicare PPO

## 2016-07-01 ENCOUNTER — Encounter (HOSPITAL_COMMUNITY): Payer: Self-pay | Admitting: Emergency Medicine

## 2016-07-01 DIAGNOSIS — R200 Anesthesia of skin: Secondary | ICD-10-CM | POA: Insufficient documentation

## 2016-07-01 DIAGNOSIS — Z79899 Other long term (current) drug therapy: Secondary | ICD-10-CM | POA: Insufficient documentation

## 2016-07-01 DIAGNOSIS — Z7982 Long term (current) use of aspirin: Secondary | ICD-10-CM | POA: Diagnosis not present

## 2016-07-01 DIAGNOSIS — R51 Headache: Secondary | ICD-10-CM | POA: Diagnosis not present

## 2016-07-01 DIAGNOSIS — R519 Headache, unspecified: Secondary | ICD-10-CM

## 2016-07-01 LAB — CBC WITH DIFFERENTIAL/PLATELET
Basophils Absolute: 0.1 10*3/uL (ref 0.0–0.1)
Basophils Relative: 1 %
Eosinophils Absolute: 0.2 10*3/uL (ref 0.0–0.7)
Eosinophils Relative: 2 %
HCT: 38.5 % (ref 36.0–46.0)
Hemoglobin: 12 g/dL (ref 12.0–15.0)
Lymphocytes Relative: 27 %
Lymphs Abs: 2.6 10*3/uL (ref 0.7–4.0)
MCH: 25 pg — ABNORMAL LOW (ref 26.0–34.0)
MCHC: 31.2 g/dL (ref 30.0–36.0)
MCV: 80.2 fL (ref 78.0–100.0)
Monocytes Absolute: 0.8 10*3/uL (ref 0.1–1.0)
Monocytes Relative: 9 %
Neutro Abs: 6 10*3/uL (ref 1.7–7.7)
Neutrophils Relative %: 61 %
Platelets: 347 10*3/uL (ref 150–400)
RBC: 4.8 MIL/uL (ref 3.87–5.11)
RDW: 15.2 % (ref 11.5–15.5)
WBC: 9.7 10*3/uL (ref 4.0–10.5)

## 2016-07-01 LAB — URINALYSIS, ROUTINE W REFLEX MICROSCOPIC
Bilirubin Urine: NEGATIVE
Glucose, UA: NEGATIVE mg/dL
Hgb urine dipstick: NEGATIVE
Ketones, ur: NEGATIVE mg/dL
Nitrite: NEGATIVE
Protein, ur: NEGATIVE mg/dL
Specific Gravity, Urine: 1.01 (ref 1.005–1.030)
pH: 6 (ref 5.0–8.0)

## 2016-07-01 LAB — BASIC METABOLIC PANEL
Anion gap: 6 (ref 5–15)
BUN: 11 mg/dL (ref 6–20)
CO2: 27 mmol/L (ref 22–32)
Calcium: 8.7 mg/dL — ABNORMAL LOW (ref 8.9–10.3)
Chloride: 103 mmol/L (ref 101–111)
Creatinine, Ser: 0.6 mg/dL (ref 0.44–1.00)
GFR calc Af Amer: >60 mL/min (ref >60)
GFR calc non Af Amer: >60 mL/min (ref >60)
Glucose, Bld: 75 mg/dL (ref 65–99)
Potassium: 3.5 mmol/L (ref 3.5–5.1)
Sodium: 136 mmol/L (ref 135–145)

## 2016-07-01 LAB — URINE MICROSCOPIC-ADD ON: RBC / HPF: NONE SEEN RBC/hpf (ref 0–5)

## 2016-07-01 NOTE — ED Notes (Signed)
Pt taken to ct 

## 2016-07-01 NOTE — ED Notes (Signed)
Pt also added that she has been urinating a lot but denies pain with  urination

## 2016-07-01 NOTE — ED Triage Notes (Addendum)
Pt reports headache,numbness, and bilateral eye pain since Sunday. Pt reports left arm numbness increased since last night. Pt reports history of stroke. Pt sent here by PCP. Pt alert and oriented. Airway patent. Speech clear. Facial symmetry.

## 2016-07-01 NOTE — ED Notes (Signed)
Called pt for room x2. No answer in waiting room.

## 2016-07-06 ENCOUNTER — Ambulatory Visit (HOSPITAL_COMMUNITY)
Admission: RE | Admit: 2016-07-06 | Discharge: 2016-07-06 | Disposition: A | Discharge status: Home / Self Care | Payer: Medicare PPO | Source: Ambulatory Visit | Attending: Nurse Practitioner | Admitting: Nurse Practitioner

## 2016-07-06 DIAGNOSIS — Z1231 Encounter for screening mammogram for malignant neoplasm of breast: Secondary | ICD-10-CM | POA: Diagnosis not present

## 2016-07-09 NOTE — ED Provider Notes (Signed)
[facility] DEPT Provider Note   CSN: [id number] Arrival date & time: 07/01/16  1558     History   Chief Complaint Chief Complaint  Patient presents with  . Headache    HPI Cathy Thomas is a 60 y.o. female.  HPI   60 year old female with numerous complaints. Headache and some facial numbness bilaterally since Sunday. She has a lot of pressure behind her eyes. No change in visual acuity. She does report some numbness in her left upper extremity associated left neck/left shoulder pain. No weakness. No nausea or vomiting. No change in her speech.  Past Medical History:  Diagnosis Date  . Fibromyalgia   . Kidney stone   . Stroke ([place])   . TIA (transient ischemic attack)     Patient Active Problem List   Diagnosis Date Noted  . BV (bacterial vaginosis) 04/24/2015    Past Surgical History:  Procedure Laterality Date  . ABDOMINAL HYSTERECTOMY    . BALLOON DILATION  12/16/2011   Procedure: BALLOON DILATION;  Surgeon: Marissa Nestle, MD;  Location: [facility];  Service: Urology;  Laterality: Left;  . CESAREAN SECTION    . KNEE ARTHROSCOPY    . STONE EXTRACTION WITH BASKET  12/16/2011   Procedure: STONE EXTRACTION WITH BASKET;  Surgeon: Marissa Nestle, MD;  Location: [facility];  Service: Urology;  Laterality: Left;    OB History    No data available       Home Medications    Prior to Admission medications   Medication Sig Start Date End Date Taking? Authorizing Provider  aspirin EC 81 MG tablet Take 81 mg by mouth daily.   Yes Historical Provider, MD  Aspirin-Salicylamide-Caffeine (BC HEADACHE POWDER PO) Take 1 Package by mouth daily as needed (headache).   Yes Historical Provider, MD  losartan (COZAAR) 50 MG tablet Take 50 mg by mouth daily.   Yes Historical Provider, MD  LYRICA 75 MG capsule Take 150 mg by mouth 2 (two) times daily as needed (Patient always takes 2 daily but can take an extra capsule if necessary.).  05/31/14  Yes Historical Provider, MD    magnesium gluconate (MAGONATE) 500 MG tablet Take 500 mg by mouth daily.    Yes Historical Provider, MD  Vitamin D, Ergocalciferol, (DRISDOL) 50000 UNITS CAPS Take 50,000 Units by mouth every 7 (seven) days. Takes on Monday   Yes Historical Provider, MD    Family History Family History  Problem Relation Age of Onset  . Cancer Mother     ovarian  . Hypertension Mother   . Heart disease Mother   . Diabetes Mother   . Cancer Father     lung, prostate  . Hypertension Father     Social History Social History  Substance Use Topics  . Smoking status: Never Smoker  . Smokeless tobacco: Never Used  . Alcohol use No     Allergies   Amoxicillin and Fluconazole   Review of Systems Review of Systems  All systems reviewed and negative, other than as noted in HPI.  Physical Exam Updated Vital Signs BP 154/66   Pulse 66   Temp 97.5 F (36.4 C) (Oral)   Resp 19   Ht 5\' 7"  (1.702 m)   Wt 270 lb (122.5 kg)   SpO2 99%   BMI 42.29 kg/m   Physical Exam  Constitutional: She is oriented to person, place, and time. She appears well-developed and well-nourished. No distress.  HENT:  Head: Normocephalic and atraumatic.  Eyes:  Conjunctivae are normal. Right eye exhibits no discharge. Left eye exhibits no discharge.  Neck: Neck supple.  Cardiovascular: Normal rate, regular rhythm and normal heart sounds.  Exam reveals no gallop and no friction rub.   No murmur heard. Pulmonary/Chest: Effort normal and breath sounds normal. No respiratory distress.  Abdominal: Soft. She exhibits no distension. There is no tenderness.  Musculoskeletal: She exhibits no edema or tenderness.  Neurological: She is alert and oriented to person, place, and time. No cranial nerve deficit or sensory deficit. She exhibits normal muscle tone. Coordination normal.  Skin: Skin is warm and dry.  Psychiatric: She has a normal mood and affect. Her behavior is normal. Thought content normal.  Nursing note and vitals  reviewed.     ED Treatments / Results  Labs (all labs ordered are listed, but only abnormal results are displayed) Labs Reviewed  CBC WITH DIFFERENTIAL/PLATELET - Abnormal; Notable for the following:       Result Value   MCH 25.0 (*)    All other components within normal limits  BASIC METABOLIC PANEL - Abnormal; Notable for the following:    Calcium 8.7 (*)    All other components within normal limits  URINALYSIS, ROUTINE W REFLEX MICROSCOPIC (NOT AT [facility]) - Abnormal; Notable for the following:    Leukocytes, UA SMALL (*)    All other components within normal limits  URINE MICROSCOPIC-ADD ON - Abnormal; Notable for the following:    Squamous Epithelial / LPF 0-5 (*)    Bacteria, UA RARE (*)    All other components within normal limits    EKG  EKG Interpretation None       Radiology No results found.   Mm Digital Screening Bilateral  Result Date: 07/07/2016 CLINICAL DATA:  Screening. EXAM: DIGITAL SCREENING BILATERAL MAMMOGRAM WITH CAD COMPARISON:  Previous exam(s). ACR Breast Density Category a: The breast tissue is almost entirely fatty. FINDINGS: There are no findings suspicious for malignancy. Images were processed with CAD. IMPRESSION: No mammographic evidence of malignancy. A result letter of this screening mammogram will be mailed directly to the patient. RECOMMENDATION: Screening mammogram in one year. (Code:SM-B-01Y) BI-RADS CATEGORY  1: Negative. Electronically Signed   By: Margarette Canada M.D.   On: 07/07/2016 12:07   Procedures Procedures (including critical care time)  Medications Ordered in ED Medications - No data to display   Initial Impression / Assessment and Plan / ED Course  I have reviewed the triage vital signs and the nursing notes.  Pertinent labs & imaging results that were available during my care of the patient were reviewed by me and considered in my medical decision making (see chart for details).  Clinical Course     Final Clinical  Impressions(s) / ED Diagnoses   Final diagnoses:  Numbness  Nonintractable headache, unspecified chronicity pattern, unspecified headache type    New Prescriptions Discharge Medication List as of 07/01/2016  6:56 PM       Virgel Manifold, MD 07/09/16 1708

## 2016-09-05 ENCOUNTER — Emergency Department (HOSPITAL_COMMUNITY)
Admission: EM | Admit: 2016-09-05 | Discharge: 2016-09-05 | Disposition: A | Discharge status: Home / Self Care | Payer: Medicare PPO | Attending: Emergency Medicine | Admitting: Emergency Medicine

## 2016-09-05 ENCOUNTER — Emergency Department (HOSPITAL_COMMUNITY): Payer: Medicare PPO

## 2016-09-05 ENCOUNTER — Encounter (HOSPITAL_COMMUNITY): Payer: Self-pay | Admitting: Emergency Medicine

## 2016-09-05 DIAGNOSIS — R109 Unspecified abdominal pain: Secondary | ICD-10-CM | POA: Diagnosis present

## 2016-09-05 DIAGNOSIS — Z7982 Long term (current) use of aspirin: Secondary | ICD-10-CM | POA: Insufficient documentation

## 2016-09-05 DIAGNOSIS — R110 Nausea: Secondary | ICD-10-CM | POA: Diagnosis not present

## 2016-09-05 DIAGNOSIS — Z79899 Other long term (current) drug therapy: Secondary | ICD-10-CM | POA: Insufficient documentation

## 2016-09-05 LAB — URINALYSIS, ROUTINE W REFLEX MICROSCOPIC
BILIRUBIN URINE: NEGATIVE
GLUCOSE, UA: NEGATIVE mg/dL
HGB URINE DIPSTICK: NEGATIVE
Ketones, ur: NEGATIVE mg/dL
NITRITE: NEGATIVE
PH: 5 (ref 5.0–8.0)
Protein, ur: NEGATIVE mg/dL
SPECIFIC GRAVITY, URINE: 1.021 (ref 1.005–1.030)

## 2016-09-05 MED ORDER — HYDROCODONE-ACETAMINOPHEN 5-325 MG PO TABS: ORAL_TABLET | ORAL | 0 refills | Status: DC

## 2016-09-05 MED ORDER — ONDANSETRON 8 MG PO TBDP
8.0000 mg | ORAL_TABLET | Freq: Once | ORAL | Status: AC
  Administered 2016-09-05: 8 mg via ORAL
  Filled 2016-09-05: qty 1

## 2016-09-05 MED ORDER — OXYCODONE-ACETAMINOPHEN 5-325 MG PO TABS
1.0000 | ORAL_TABLET | Freq: Once | ORAL | Status: AC
  Administered 2016-09-05: 1 via ORAL
  Filled 2016-09-05: qty 1

## 2016-09-05 MED ORDER — METHOCARBAMOL 500 MG PO TABS: 1000.0000 mg | ORAL_TABLET | Freq: Four times a day (QID) | ORAL | 0 refills | Status: DC | PRN

## 2016-09-05 MED ORDER — ONDANSETRON 4 MG PO TBDP
4.0000 mg | ORAL_TABLET | Freq: Three times a day (TID) | ORAL | 0 refills | Status: DC | PRN
Start: 2016-09-05 — End: 2017-11-15

## 2016-09-05 NOTE — ED Provider Notes (Signed)
[facility] DEPT Provider Note   CSN: [id number] Arrival date & time: 09/05/16  [id number]     History   Chief Complaint Chief Complaint  Patient presents with  . Flank Pain    x 1 week    HPI Cathy Thomas is a 61 y.o. female.  HPI  Pt was seen at 1620. Per pt, c/o sudden onset and persistence of constant left sided flank "pain" for the past 1 week.  Pt describes the pain as "like my last kidney stone." Has been associated with nausea. Pt states she "thinks I passed a kidney stone" 2 days ago.  Denies vaginal bleeding/discharge, no dysuria/hematuria, no abd pain, no N/V, no diarrhea, no CP/SOB, no fevers, no rash.    Past Medical History:  Diagnosis Date  . Fibromyalgia   . Kidney stone   . Stroke ([place])   . TIA (transient ischemic attack)     Patient Active Problem List   Diagnosis Date Noted  . BV (bacterial vaginosis) 04/24/2015    Past Surgical History:  Procedure Laterality Date  . ABDOMINAL HYSTERECTOMY    . BALLOON DILATION  12/16/2011   Procedure: BALLOON DILATION;  Surgeon: Marissa Nestle, MD;  Location: [facility];  Service: Urology;  Laterality: Left;  . CESAREAN SECTION    . KNEE ARTHROSCOPY    . STONE EXTRACTION WITH BASKET  12/16/2011   Procedure: STONE EXTRACTION WITH BASKET;  Surgeon: Marissa Nestle, MD;  Location: [facility];  Service: Urology;  Laterality: Left;    OB History    No data available       Home Medications    Prior to Admission medications   Medication Sig Start Date End Date Taking? Authorizing Provider  aspirin EC 81 MG tablet Take 81 mg by mouth daily.    Historical Provider, MD  Aspirin-Salicylamide-Caffeine (BC HEADACHE POWDER PO) Take 1 Package by mouth daily as needed (headache).    Historical Provider, MD  losartan (COZAAR) 50 MG tablet Take 50 mg by mouth daily.    Historical Provider, MD  LYRICA 75 MG capsule Take 150 mg by mouth 2 (two) times daily as needed (Patient always takes 2 daily but can take an extra capsule if  necessary.).  05/31/14   Historical Provider, MD  magnesium gluconate (MAGONATE) 500 MG tablet Take 500 mg by mouth daily.     Historical Provider, MD  Vitamin D, Ergocalciferol, (DRISDOL) 50000 UNITS CAPS Take 50,000 Units by mouth every 7 (seven) days. Takes on Monday    Historical Provider, MD    Family History Family History  Problem Relation Age of Onset  . Cancer Mother     ovarian  . Hypertension Mother   . Heart disease Mother   . Diabetes Mother   . Cancer Father     lung, prostate  . Hypertension Father     Social History Social History  Substance Use Topics  . Smoking status: Never Smoker  . Smokeless tobacco: Never Used  . Alcohol use No     Allergies   Amoxicillin and Fluconazole   Review of Systems Review of Systems ROS: Statement: All systems negative except as marked or noted in the HPI; Constitutional: Negative for fever and chills. ; ; Eyes: Negative for eye pain, redness and discharge. ; ; ENMT: Negative for ear pain, hoarseness, nasal congestion, sinus pressure and sore throat. ; ; Cardiovascular: Negative for chest pain, palpitations, diaphoresis, dyspnea and peripheral edema. ; ; Respiratory: Negative for cough, wheezing and  stridor. ; ; Gastrointestinal: +nausea. Negative for vomiting, diarrhea, abdominal pain, blood in stool, hematemesis, jaundice and rectal bleeding. . ; ; Genitourinary: +flank pain. Negative for dysuria and hematuria. ; ; GYN:  No pelvic pain, no vaginal bleeding, no vaginal discharge, no vulvar pain. ;; Musculoskeletal: Negative for back pain and neck pain. Negative for swelling and trauma.; ; Skin: Negative for pruritus, rash, abrasions, blisters, bruising and skin lesion.; ; Neuro: Negative for headache, lightheadedness and neck stiffness. Negative for weakness, altered level of consciousness, altered mental status, extremity weakness, paresthesias, involuntary movement, seizure and syncope.       Physical Exam Updated Vital  Signs BP 140/67   Pulse 95   Temp 97.5 F (36.4 C) (Oral)   Resp 18   Ht 5\' 7"  (1.702 m)   Wt 270 lb (122.5 kg)   SpO2 100%   BMI 42.29 kg/m   Physical Exam 1625: Physical examination:  Nursing notes reviewed; Vital signs and O2 SAT reviewed;  Constitutional: Well developed, Well nourished, Well hydrated, In no acute distress; Head:  Normocephalic, atraumatic; Eyes: EOMI, PERRL, No scleral icterus; ENMT: Mouth and pharynx normal, Mucous membranes moist; Neck: Supple, Full range of motion, No lymphadenopathy; Cardiovascular: Regular rate and rhythm, No gallop; Respiratory: Breath sounds clear & equal bilaterally, No wheezes.  Speaking full sentences with ease, Normal respiratory effort/excursion; Chest: Nontender, Movement normal; Abdomen: Soft, Nontender, Nondistended, Normal bowel sounds; Genitourinary: No CVA tenderness; Spine:  No midline CS, TS, LS tenderness. +TTP left lumbar paraspinal muscles. No rash.;; Extremities: Pulses normal, No tenderness, No edema, No calf edema or asymmetry.; Neuro: AA&Ox3, Major CN grossly intact.  Speech clear. No gross focal motor or sensory deficits in extremities. Climbs on and off stretcher easily by herself. Gait steady.; Skin: Color normal, Warm, Dry.   ED Treatments / Results  Labs (all labs ordered are listed, but only abnormal results are displayed)   EKG  EKG Interpretation None       Radiology   Procedures Procedures (including critical care time)  Medications Ordered in ED Medications  ondansetron (ZOFRAN-ODT) disintegrating tablet 8 mg (not administered)  oxyCODONE-acetaminophen (PERCOCET/ROXICET) 5-325 MG per tablet 1 tablet (not administered)     Initial Impression / Assessment and Plan / ED Course  I have reviewed the triage vital signs and the nursing notes.  Pertinent labs & imaging results that were available during my care of the patient were reviewed by me and considered in my medical decision making (see chart for  details).  MDM Reviewed: previous chart, nursing note and vitals Reviewed previous: labs Interpretation: labs and CT scan   Results for orders placed or performed during the hospital encounter of 09/05/16  Urinalysis, Routine w reflex microscopic  Result Value Ref Range   Color, Urine YELLOW YELLOW   APPearance HAZY (A) CLEAR   Specific Gravity, Urine 1.021 1.005 - 1.030   pH 5.0 5.0 - 8.0   Glucose, UA NEGATIVE NEGATIVE mg/dL   Hgb urine dipstick NEGATIVE NEGATIVE   Bilirubin Urine NEGATIVE NEGATIVE   Ketones, ur NEGATIVE NEGATIVE mg/dL   Protein, ur NEGATIVE NEGATIVE mg/dL   Nitrite NEGATIVE NEGATIVE   Leukocytes, UA SMALL (A) NEGATIVE   RBC / HPF 0-5 0 - 5 RBC/hpf   WBC, UA 0-5 0 - 5 WBC/hpf   Bacteria, UA RARE (A) NONE SEEN   Mucous PRESENT    Ct Renal Stone Study Result Date: 09/05/2016 CLINICAL DATA:  Left flank pain for 1 week. History of renal stones  and gallstones. EXAM: CT ABDOMEN AND PELVIS WITHOUT CONTRAST TECHNIQUE: Multidetector CT imaging of the abdomen and pelvis was performed following the standard protocol without IV contrast. COMPARISON:  CT abdomen dated 06/27/2013. FINDINGS: Lower chest: No acute abnormality. Hepatobiliary: The liver appears normal. Large stone again noted within the gallbladder, measuring approximately 2.6 cm greatest dimension. No pericholecystic inflammation or other signs of acute cholecystitis. No bile duct dilatation appreciated. Pancreas: Partially infiltrated with fat but otherwise unremarkable. Spleen: Stable hypodensities within the spleen, as described on earlier reports dating back to 2011 indicating benignity. Adrenals/Urinary Tract: Adrenal glands appear normal. Kidneys are unremarkable without mass, stone or hydronephrosis. No perinephric inflammation. No ureteral or bladder calculi identified. Bladder is decompressed. Stomach/Bowel: Bowel is normal in caliber. No bowel wall thickening or evidence of bowel wall inflammation seen.  Appendix is normal. Vascular/Lymphatic: No significant vascular findings are present. No enlarged abdominal or pelvic lymph nodes. Reproductive: Hysterectomy.  No adnexal masses. Other: No free fluid or abscess collection. No free intraperitoneal air. Musculoskeletal: Degenerative changes within the lower lumbar spine, mild to moderate in degree. No acute or suspicious osseous finding. Superficial soft tissues are unremarkable. IMPRESSION: 1. No acute findings within the abdomen or pelvis. No renal or ureteral calculi. No free fluid. No bowel obstruction or evidence of bowel wall inflammation. No evidence of acute solid organ abnormality. Appendix is normal. 2. Cholelithiasis without evidence of acute cholecystitis. 3. Degenerative changes within the lumbar spine, mild to moderate in degree. If symptoms could be radiculopathic in nature, would consider nonemergent lumbar spine MRI to exclude associated nerve root impingement. Electronically Signed   By: Franki Cabot M.D.   On: 09/05/2016 17:42    1800:  Udip without UTI. CT scan reassuring. Pt has tol PO well while in the ED without N/V. Tx symptomatically, f/u PMD. Dx and testing d/w pt and family.  Questions answered.  Verb understanding, agreeable to d/c home with outpt f/u.  Final Clinical Impressions(s) / ED Diagnoses   Final diagnoses:  None    New Prescriptions New Prescriptions   No medications on file     Francine Graven, DO 09/09/16 [id number]

## 2016-09-05 NOTE — ED Triage Notes (Signed)
Pt reports L flank pain x1 week, passed kidney stone this week on Thursday.  Pt denies dysuria.  Has nausea without v/d.

## 2016-09-05 NOTE — ED Notes (Signed)
Patient transported to CT 

## 2016-09-05 NOTE — Discharge Instructions (Signed)
Take the prescriptions as directed.  Apply moist heat or ice to the area(s) of discomfort, for 15 minutes at a time, several times per day for the next few days.  Do not fall asleep on a heating or ice pack.  Call your regular medical doctor on Monday to schedule a follow up appointment this week.  Return to the Emergency Department immediately if worsening. ° °

## 2016-09-07 LAB — URINE CULTURE

## 2016-10-29 ENCOUNTER — Emergency Department (HOSPITAL_COMMUNITY)
Admission: EM | Admit: 2016-10-29 | Discharge: 2016-10-29 | Disposition: A | Discharge status: Home / Self Care | Payer: Medicare PPO | Attending: Emergency Medicine | Admitting: Emergency Medicine

## 2016-10-29 ENCOUNTER — Encounter (HOSPITAL_COMMUNITY): Payer: Self-pay

## 2016-10-29 DIAGNOSIS — Z7982 Long term (current) use of aspirin: Secondary | ICD-10-CM | POA: Insufficient documentation

## 2016-10-29 DIAGNOSIS — T7840XA Allergy, unspecified, initial encounter: Secondary | ICD-10-CM | POA: Diagnosis not present

## 2016-10-29 DIAGNOSIS — Z79899 Other long term (current) drug therapy: Secondary | ICD-10-CM | POA: Insufficient documentation

## 2016-10-29 LAB — CBG MONITORING, ED: GLUCOSE-CAPILLARY: 68 mg/dL (ref 65–99)

## 2016-10-29 MED ORDER — DIPHENHYDRAMINE HCL 25 MG PO CAPS
25.0000 mg | ORAL_CAPSULE | Freq: Once | ORAL | Status: AC
  Administered 2016-10-29: 25 mg via ORAL
  Filled 2016-10-29: qty 1

## 2016-10-29 MED ORDER — METHYLPREDNISOLONE SODIUM SUCC 125 MG IJ SOLR
125.0000 mg | Freq: Once | INTRAMUSCULAR | Status: AC
  Administered 2016-10-29: 125 mg via INTRAMUSCULAR
  Filled 2016-10-29: qty 2

## 2016-10-29 MED ORDER — FAMOTIDINE 20 MG PO TABS
20.0000 mg | ORAL_TABLET | Freq: Once | ORAL | Status: AC
  Administered 2016-10-29: 20 mg via ORAL
  Filled 2016-10-29: qty 1

## 2016-10-29 MED ORDER — PREDNISONE 20 MG PO TABS: 40.0000 mg | ORAL_TABLET | Freq: Every day | ORAL | 0 refills | Status: DC

## 2016-10-29 NOTE — ED Triage Notes (Deleted)
Can't swallow.  My throat was itching yesterday, and today when I woke up it was swollen and hard to swallow.  I have been eating on ice chips, but it hurts to swallow.

## 2016-10-29 NOTE — Discharge Instructions (Signed)
Do not use Monistat.  Start the prednisone tomorrow.  Follow-up with your doctor for recheck.  Return here for any worsening symptoms

## 2016-10-29 NOTE — ED Notes (Signed)
Pt reports she gelt like she was getting a vaginal yeast infection and took OTC monistat for relief on 3/19. Denies new medications, cosmetics, or food. States throat feels tight, airway is intact and patent. Pt speaking in full sentences.

## 2016-10-29 NOTE — ED Triage Notes (Signed)
Patient states that she used a one time dose for vaginal yeast and has been feeling worse since.  Noticed today that her mouth felt like it was burning and throat felt like it was swelling.  States that she had some trouble breathing at night since using the medication last Monday.

## 2016-10-30 NOTE — ED Provider Notes (Signed)
[facility] DEPT Provider Note   CSN: [id number] Arrival date & time: 10/29/16  1906     History   Chief Complaint Chief Complaint  Patient presents with  . Allergic Reaction    HPI Cathy Thomas is a 61 y.o. female.  HPI   Cathy Thomas is a 61 y.o. female who presents to the Emergency Department complaining of burning and throat swelling for three days.  She states that her symptoms began shortly after using OTC Monistat for a vaginal yeast infection.  She reports feeling "irritation" to her throat and associated shortness of breath.  She reports having similar reaction in the past after oral fluconazole.  She denies itching, wheezing, swelling of the face, and rash.  No chest pain. She has not tried any benadryl.   Past Medical History:  Diagnosis Date  . Fibromyalgia   . Kidney stone   . Stroke ([place])   . TIA (transient ischemic attack)     Patient Active Problem List   Diagnosis Date Noted  . BV (bacterial vaginosis) 04/24/2015    Past Surgical History:  Procedure Laterality Date  . ABDOMINAL HYSTERECTOMY    . BALLOON DILATION  12/16/2011   Procedure: BALLOON DILATION;  Surgeon: Marissa Nestle, MD;  Location: [facility];  Service: Urology;  Laterality: Left;  . CESAREAN SECTION    . KNEE ARTHROSCOPY    . STONE EXTRACTION WITH BASKET  12/16/2011   Procedure: STONE EXTRACTION WITH BASKET;  Surgeon: Marissa Nestle, MD;  Location: [facility];  Service: Urology;  Laterality: Left;    OB History    No data available       Home Medications    Prior to Admission medications   Medication Sig Start Date End Date Taking? Authorizing Provider  aspirin EC 81 MG tablet Take 81 mg by mouth daily.   Yes Historical Provider, MD  losartan (COZAAR) 50 MG tablet Take 50 mg by mouth daily.   Yes Historical Provider, MD  LYRICA 75 MG capsule Take 150 mg by mouth 2 (two) times daily as needed (Patient always takes 2 daily but can take an extra capsule if necessary.).   05/31/14  Yes Historical Provider, MD  magnesium gluconate (MAGONATE) 500 MG tablet Take 500 mg by mouth daily.    Yes Historical Provider, MD  methocarbamol (ROBAXIN) 500 MG tablet Take 2 tablets (1,000 mg total) by mouth 4 (four) times daily as needed for muscle spasms (muscle spasm/pain). 09/05/16  Yes Francine Graven, DO  HYDROcodone-acetaminophen (NORCO/VICODIN) 5-325 MG tablet 1 or 2 tabs PO q6 hours prn pain 09/05/16   Francine Graven, DO  ondansetron (ZOFRAN ODT) 4 MG disintegrating tablet Take 1 tablet (4 mg total) by mouth every 8 (eight) hours as needed for nausea or vomiting. 09/05/16   Francine Graven, DO  predniSONE (DELTASONE) 20 MG tablet Take 2 tablets (40 mg total) by mouth daily. 10/29/16   Kaleeyah Cuffie, PA-C  Vitamin D, Ergocalciferol, (DRISDOL) 50000 UNITS CAPS Take 50,000 Units by mouth every 7 (seven) days. Takes on Monday    Historical Provider, MD    Family History Family History  Problem Relation Age of Onset  . Cancer Mother     ovarian  . Hypertension Mother   . Heart disease Mother   . Diabetes Mother   . Cancer Father     lung, prostate  . Hypertension Father     Social History Social History  Substance Use Topics  . Smoking status: Never Smoker  .  Smokeless tobacco: Never Used  . Alcohol use No     Allergies   Amoxicillin and Fluconazole   Review of Systems Review of Systems  Constitutional: Negative.  Negative for appetite change and fever.  HENT: Negative for ear pain, sore throat, trouble swallowing and voice change.   Eyes: Negative.   Respiratory: Negative for cough, shortness of breath, wheezing and stridor.   Cardiovascular: Negative for chest pain.  Gastrointestinal: Negative for abdominal pain, nausea and vomiting.  Musculoskeletal: Negative for back pain and neck pain.  Skin: Negative for rash.  Neurological: Negative for dizziness, numbness and headaches.  Hematological: Does not bruise/bleed easily.  Psychiatric/Behavioral: The  patient is not nervous/anxious.      Physical Exam Updated Vital Signs BP (!) 142/78   Pulse 84   Temp 98.5 F (36.9 C) (Oral)   Resp 16   Ht 5\' 7"  (1.702 m)   Wt 122.5 kg   SpO2 99%   BMI 42.29 kg/m   Physical Exam  Constitutional: She is oriented to person, place, and time. She appears well-developed and well-nourished. No distress.  HENT:  Head: Normocephalic.  Mouth/Throat: Uvula is midline, oropharynx is clear and moist and mucous membranes are normal. No uvula swelling.  Eyes: Conjunctivae are normal. Pupils are equal, round, and reactive to light.  Neck: Normal range of motion. Neck supple. No JVD present. No Kernig's sign noted. No thyromegaly present.  Cardiovascular: Normal rate, regular rhythm and normal heart sounds.   Pulmonary/Chest: Effort normal and breath sounds normal. No stridor. No respiratory distress. She has no wheezes.  Abdominal: Soft. Normal appearance. There is no tenderness. There is no rebound and no guarding.  Musculoskeletal: Normal range of motion. She exhibits no edema.  Lymphadenopathy:    She has no cervical adenopathy.  Neurological: She is alert and oriented to person, place, and time.  Skin: Skin is warm and dry. No rash noted.  Psychiatric: She has a normal mood and affect.  Nursing note and vitals reviewed.    ED Treatments / Results  Labs (all labs ordered are listed, but only abnormal results are displayed) Labs Reviewed  CBG MONITORING, ED    EKG  EKG Interpretation None       Radiology No results found.  Procedures Procedures (including critical care time)  Medications Ordered in ED Medications  diphenhydrAMINE (BENADRYL) capsule 25 mg (25 mg Oral Given 10/29/16 2151)  methylPREDNISolone sodium succinate (SOLU-MEDROL) 125 mg/2 mL injection 125 mg (125 mg Intramuscular Given 10/29/16 2042)  famotidine (PEPCID) tablet 20 mg (20 mg Oral Given 10/29/16 2042)     Initial Impression / Assessment and Plan / ED Course    I have reviewed the triage vital signs and the nursing notes.  Pertinent labs & imaging results that were available during my care of the patient were reviewed by me and considered in my medical decision making (see chart for details).     Pt is well appearing.  Vitals stable.  Airway patent w/o evidence of edema.  Lungs clear, no skin changes.    After medications, patient reports feeling better and I was notified by nursing that patient was requesting discharge.  She appears stable, return precautions discussed.   Final Clinical Impressions(s) / ED Diagnoses   Final diagnoses:  Allergic reaction, initial encounter    New Prescriptions Discharge Medication List as of 10/29/2016  9:33 PM    START taking these medications   Details  predniSONE (DELTASONE) 20 MG tablet Take 2 tablets (  40 mg total) by mouth daily., Starting Thu 10/29/2016, [employer], PA-C 10/30/16 0141    Fredia Sorrow, MD 11/01/16 [phone]

## 2017-01-17 ENCOUNTER — Emergency Department (HOSPITAL_COMMUNITY)
Admission: EM | Admit: 2017-01-17 | Discharge: 2017-01-17 | Disposition: A | Discharge status: Home / Self Care | Payer: Medicare PPO | Attending: Emergency Medicine | Admitting: Emergency Medicine

## 2017-01-17 ENCOUNTER — Encounter (HOSPITAL_COMMUNITY): Payer: Self-pay | Admitting: Emergency Medicine

## 2017-01-17 DIAGNOSIS — Z7982 Long term (current) use of aspirin: Secondary | ICD-10-CM | POA: Diagnosis not present

## 2017-01-17 DIAGNOSIS — R200 Anesthesia of skin: Secondary | ICD-10-CM | POA: Diagnosis present

## 2017-01-17 DIAGNOSIS — Z79899 Other long term (current) drug therapy: Secondary | ICD-10-CM | POA: Diagnosis not present

## 2017-01-17 DIAGNOSIS — R202 Paresthesia of skin: Secondary | ICD-10-CM | POA: Insufficient documentation

## 2017-01-17 LAB — COMPREHENSIVE METABOLIC PANEL
ALBUMIN: 3.9 g/dL (ref 3.5–5.0)
ALT: 18 U/L (ref 14–54)
AST: 21 U/L (ref 15–41)
Alkaline Phosphatase: 103 U/L (ref 38–126)
Anion gap: 11 (ref 5–15)
BUN: 13 mg/dL (ref 6–20)
CO2: 25 mmol/L (ref 22–32)
CREATININE: 0.68 mg/dL (ref 0.44–1.00)
Calcium: 9.6 mg/dL (ref 8.9–10.3)
Chloride: 104 mmol/L (ref 101–111)
GFR calc Af Amer: >60 mL/min (ref >60)
GFR calc non Af Amer: >60 mL/min (ref >60)
GLUCOSE: 104 mg/dL — AB (ref 65–99)
Potassium: 3.7 mmol/L (ref 3.5–5.1)
SODIUM: 140 mmol/L (ref 135–145)
Total Bilirubin: 0.8 mg/dL (ref 0.3–1.2)
Total Protein: 8 g/dL (ref 6.5–8.1)

## 2017-01-17 LAB — CBC WITH DIFFERENTIAL/PLATELET
BASOS ABS: 0.1 10*3/uL (ref 0.0–0.1)
BASOS PCT: 1 %
Eosinophils Absolute: 0.2 10*3/uL (ref 0.0–0.7)
Eosinophils Relative: 2 %
HEMATOCRIT: 39.3 % (ref 36.0–46.0)
HEMOGLOBIN: 12.3 g/dL (ref 12.0–15.0)
Lymphocytes Relative: 22 %
Lymphs Abs: 2.1 10*3/uL (ref 0.7–4.0)
MCH: 24.8 pg — ABNORMAL LOW (ref 26.0–34.0)
MCHC: 31.3 g/dL (ref 30.0–36.0)
MCV: 79.4 fL (ref 78.0–100.0)
Monocytes Absolute: 0.7 10*3/uL (ref 0.1–1.0)
Monocytes Relative: 8 %
NEUTROS ABS: 6.7 10*3/uL (ref 1.7–7.7)
NEUTROS PCT: 69 %
Platelets: 360 10*3/uL (ref 150–400)
RBC: 4.95 MIL/uL (ref 3.87–5.11)
RDW: 15.3 % (ref 11.5–15.5)
WBC: 9.7 10*3/uL (ref 4.0–10.5)

## 2017-01-17 LAB — MAGNESIUM: Magnesium: 2 mg/dL (ref 1.7–2.4)

## 2017-01-17 NOTE — Discharge Instructions (Signed)
Your exam and lab tests are reassuring today.  We do not know the source of your numbness, but you do not have any electrolyte abnormalities and your symptoms and exam do not indicate a stroke.  See your doctor for a recheck if not resolving over the next several days.  Return here for any new or worsening symptoms.

## 2017-01-17 NOTE — ED Provider Notes (Signed)
[facility] DEPT Provider Note   CSN: [id number] Arrival date & time: 01/17/17  1321     History   Chief Complaint Chief Complaint  Patient presents with  . Numbness    facial numbness since 0530    HPI Cathy Thomas is a 61 y.o. 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  She denies weakness, difficulty with speech or focal deficits. She does report feeling pressure in her left ear for the past several days, denies hearing loss, drainage or pain.  Denies sinus pain, nasal congestion, fevers, chills, or other sx suggesting uri.    She describes having a new  generalized pinprick sensation and has seen a transient rash mostly to her extremities when she is outdoors in the sun this past week.  She has had no significant long periods of being outdoors, stating she can simply walk from her car to the home and develop this discomfort which resolves once indoors.  She denies any new medications, lotions, make up or other skin products.  She did have her hair color touched up 3 days ago, is unsure if the hairdresser used a new product.  She has found no alleviators.    HPI  Past Medical History:  Diagnosis Date  . Fibromyalgia   . Kidney stone   . Stroke ([place])   . TIA (transient ischemic attack)     Patient Active Problem List   Diagnosis Date Noted  . BV (bacterial vaginosis) 04/24/2015    Past Surgical History:  Procedure Laterality Date  . ABDOMINAL HYSTERECTOMY    . BALLOON DILATION  12/16/2011   Procedure: BALLOON DILATION;  Surgeon: Marissa Nestle, MD;  Location: [facility];  Service: Urology;  Laterality: Left;  . CESAREAN SECTION    . KNEE ARTHROSCOPY    . STONE EXTRACTION WITH BASKET  12/16/2011   Procedure: STONE EXTRACTION WITH BASKET;  Surgeon: Marissa Nestle, MD;  Location: [facility];  Service: Urology;  Laterality: Left;    OB History    No data available        Home Medications    Prior to Admission medications   Medication Sig Start Date End Date Taking? Authorizing Provider  aspirin EC 81 MG tablet Take 81 mg by mouth daily.   Yes [provider]  losartan (COZAAR) 50 MG tablet Take 50 mg by mouth daily.   Yes [provider]  LYRICA 75 MG capsule Take 150 mg by mouth 2 (two) times daily as needed (Patient always takes 2 daily but can take an extra capsule if necessary.).  05/31/14  Yes [provider]  magnesium gluconate (MAGONATE) 500 MG tablet Take 500 mg by mouth daily.    Yes [provider]  methocarbamol (ROBAXIN) 500 MG tablet Take 2 tablets (1,000 mg total) by mouth 4 (four) times daily as needed for muscle spasms (muscle spasm/pain). 09/05/16  Yes Francine Graven, DO  ondansetron (ZOFRAN ODT) 4 MG disintegrating tablet Take 1 tablet (4 mg total) by mouth every 8 (eight) hours as needed for nausea or vomiting. 09/05/16  Yes Francine Graven, DO  Vitamin D, Ergocalciferol, (DRISDOL) 50000 UNITS CAPS Take 50,000 Units by mouth every 7 (seven) days. Takes on Monday   Yes [provider]  HYDROcodone-acetaminophen (NORCO/VICODIN) 5-325 MG tablet 1 or 2 tabs PO q6 hours prn pain Patient not taking: Reported on 01/17/2017 09/05/16   Francine Graven,  DO  predniSONE (DELTASONE) 20 MG tablet Take 2 tablets (40 mg total) by mouth daily. Patient not taking: Reported on 01/17/2017 10/29/16   Kem Parkinson, PA-C    Family History Family History  Problem Relation Age of Onset  . Cancer Mother        ovarian  . Hypertension Mother   . Heart disease Mother   . Diabetes Mother   . Cancer Father        lung, prostate  . Hypertension Father     Social History Social History  Substance Use Topics  . Smoking status: Never Smoker  . Smokeless tobacco: Never Used  . Alcohol use No     Allergies   Amoxicillin and Fluconazole   Review of Systems Review of Systems  Constitutional: Negative  for fever.  HENT: Negative for congestion, ear discharge, ear pain, facial swelling, hearing loss, rhinorrhea, sinus pain and sore throat.   Eyes: Negative.   Respiratory: Negative for chest tightness and shortness of breath.   Cardiovascular: Negative for chest pain.  Gastrointestinal: Negative for abdominal pain and nausea.  Genitourinary: Negative.   Musculoskeletal: Negative for arthralgias, joint swelling and neck pain.  Skin: Negative.  Negative for rash and wound.       Negative except as mentioned in HPI.   Neurological: Positive for numbness. Negative for dizziness, weakness, light-headedness and headaches.  Psychiatric/Behavioral: Negative.      Physical Exam Updated Vital Signs BP (!) 173/125 (BP Location: Left Arm)   Pulse (!) 102   Temp 97.8 F (36.6 C) (Oral)   Resp 18   Ht 5\' 7"  (1.702 m)   Wt 122.5 kg (270 lb)   SpO2 95%   BMI 42.29 kg/m   Physical Exam  Constitutional: She appears well-developed and well-nourished.  HENT:  Head: Normocephalic and atraumatic.  Right Ear: Hearing and tympanic membrane normal.  Left Ear: Hearing and tympanic membrane normal.  Mouth/Throat: Oropharynx is clear and moist.  Eyes: Conjunctivae and EOM are normal. Pupils are equal, round, and reactive to light.  Neck: Normal range of motion.  Cardiovascular: Normal rate, regular rhythm, normal heart sounds and intact distal pulses.   Pulmonary/Chest: Effort normal and breath sounds normal. She has no wheezes.  Abdominal: Soft. Bowel sounds are normal. There is no tenderness.  Musculoskeletal: Normal range of motion.  Neurological: She is alert. She has normal strength. A sensory deficit is present. No cranial nerve deficit. She exhibits normal muscle tone. Coordination normal. GCS eye subscore is 4. GCS verbal subscore is 5. GCS motor subscore is 6.  Decreased sensation to light touch bilateral face, left>right. Equal grip strength.  Negative pronator drift.  Normal heel/shin test.   Skin: Skin is warm and dry. No rash noted.  Psychiatric: She has a normal mood and affect.  Nursing note and vitals reviewed.    ED Treatments / Results  Labs (all labs ordered are listed, but only abnormal results are displayed) Labs Reviewed  CBC WITH DIFFERENTIAL/PLATELET - Abnormal; Notable for the following:       Result Value   MCH 24.8 (*)    All other components within normal limits  COMPREHENSIVE METABOLIC PANEL - Abnormal; Notable for the following:    Glucose, Bld 104 (*)    All other components within normal limits  MAGNESIUM    EKG  EKG Interpretation None       Radiology No results found.  Procedures Procedures (including critical care time)  Medications Ordered in ED Medications -  No data to display   Initial Impression / Assessment and Plan / ED Course  I have reviewed the triage vital signs and the nursing notes.  Pertinent labs & imaging results that were available during my care of the patient were reviewed by me and considered in my medical decision making (see chart for details).     Labs reviewed, no abnormality and no exam findings or hx to suggest cva.  Prn f/u here for any worsened sx, plan f/u with pcp this week for recheck.  Suspect peripheral neuropathy of unclear etiology.    Pt was seen by Dr. Lita Mains during todays visit.  Final Clinical Impressions(s) / ED Diagnoses   Final diagnoses:  Paresthesia    New Prescriptions New Prescriptions   No medications on file     Landis Martins 01/17/17 [place]    Julianne Rice, MD 01/19/17 [id number]

## 2017-01-17 NOTE — ED Triage Notes (Addendum)
Pt reports facial numbness for the last 7-8 hours Previous stroke - also reports this week that she has had tingling when out in the sun  Dr Ahmed Prima PCP

## 2017-04-09 ENCOUNTER — Emergency Department (HOSPITAL_COMMUNITY): Payer: Medicare PPO

## 2017-04-09 ENCOUNTER — Encounter (HOSPITAL_COMMUNITY): Payer: Self-pay | Admitting: Emergency Medicine

## 2017-04-09 ENCOUNTER — Emergency Department (HOSPITAL_COMMUNITY)
Admission: EM | Admit: 2017-04-09 | Discharge: 2017-04-09 | Disposition: A | Discharge status: Home / Self Care | Payer: Medicare PPO | Attending: Emergency Medicine | Admitting: Emergency Medicine

## 2017-04-09 DIAGNOSIS — Z79899 Other long term (current) drug therapy: Secondary | ICD-10-CM | POA: Insufficient documentation

## 2017-04-09 DIAGNOSIS — Z7982 Long term (current) use of aspirin: Secondary | ICD-10-CM | POA: Diagnosis not present

## 2017-04-09 DIAGNOSIS — R519 Headache, unspecified: Secondary | ICD-10-CM

## 2017-04-09 DIAGNOSIS — R51 Headache: Secondary | ICD-10-CM | POA: Insufficient documentation

## 2017-04-09 DIAGNOSIS — I10 Essential (primary) hypertension: Secondary | ICD-10-CM | POA: Insufficient documentation

## 2017-04-09 HISTORY — DX: Headache: R51

## 2017-04-09 HISTORY — DX: Headache, unspecified: R51.9

## 2017-04-09 LAB — I-STAT CHEM 8, ED
BUN: 10 mg/dL (ref 6–20)
CALCIUM ION: 1.02 mmol/L — AB (ref 1.15–1.40)
CHLORIDE: 105 mmol/L (ref 101–111)
CREATININE: 0.6 mg/dL (ref 0.44–1.00)
GLUCOSE: 120 mg/dL — AB (ref 65–99)
HCT: 39 % (ref 36.0–46.0)
HEMOGLOBIN: 13.3 g/dL (ref 12.0–15.0)
Potassium: 3.7 mmol/L (ref 3.5–5.1)
Sodium: 140 mmol/L (ref 135–145)
TCO2: 26 mmol/L (ref 22–32)

## 2017-04-09 MED ORDER — ACETAMINOPHEN 325 MG PO TABS
650.0000 mg | ORAL_TABLET | Freq: Once | ORAL | Status: AC
  Administered 2017-04-09: 650 mg via ORAL
  Filled 2017-04-09: qty 2

## 2017-04-09 MED ORDER — IBUPROFEN 400 MG PO TABS
400.0000 mg | ORAL_TABLET | Freq: Once | ORAL | Status: AC
  Administered 2017-04-09: 400 mg via ORAL
  Filled 2017-04-09: qty 1

## 2017-04-09 NOTE — ED Notes (Signed)
EDP at the bedside.  ?

## 2017-04-09 NOTE — ED Provider Notes (Signed)
[facility] DEPT Provider Note   CSN: [id number] Arrival date & time: 04/09/17  1159     History   Chief Complaint Chief Complaint  Patient presents with  . Headache  . Hypertension    HPI Cathy Thomas is a 61 y.o. female.  HPI  Pt was seen at 1220. Per pt, c/o gradual onset and persistence of constant frontal headache for the past 2 weeks. Has been associated with sinus/ears congestion. States she was evaluated at her PMD's office yesterday and was told her "BP was high."   Denies headache was sudden or maximal in onset or at any time.  Denies visual changes, no focal motor weakness, no tingling/numbness in extremities, no fevers, no neck pain, no rash, no CP/SOB, no abd pain, no N/V/D.     Past Medical History:  Diagnosis Date  . Fibromyalgia   . Headache   . Kidney stone   . Stroke ([place])   . TIA (transient ischemic attack)     Patient Active Problem List   Diagnosis Date Noted  . BV (bacterial vaginosis) 04/24/2015    Past Surgical History:  Procedure Laterality Date  . ABDOMINAL HYSTERECTOMY    . BALLOON DILATION  12/16/2011   Procedure: BALLOON DILATION;  Surgeon: Marissa Nestle, MD;  Location: [facility];  Service: Urology;  Laterality: Left;  . CESAREAN SECTION    . KNEE ARTHROSCOPY    . STONE EXTRACTION WITH BASKET  12/16/2011   Procedure: STONE EXTRACTION WITH BASKET;  Surgeon: Marissa Nestle, MD;  Location: [facility];  Service: Urology;  Laterality: Left;    OB History    No data available       Home Medications    Prior to Admission medications   Medication Sig Start Date End Date Taking? Authorizing Provider  aspirin EC 81 MG tablet Take 81 mg by mouth daily.    [provider]  HYDROcodone-acetaminophen (NORCO/VICODIN) 5-325 MG tablet 1 or 2 tabs PO q6 hours prn pain Patient not taking: Reported on 01/17/2017 09/05/16   Francine Graven, DO  losartan (COZAAR) 50 MG tablet Take 50 mg by mouth daily.    [provider]    LYRICA 75 MG capsule Take 150 mg by mouth 2 (two) times daily as needed (Patient always takes 2 daily but can take an extra capsule if necessary.).  05/31/14   [provider]  magnesium gluconate (MAGONATE) 500 MG tablet Take 500 mg by mouth daily.     [provider]  methocarbamol (ROBAXIN) 500 MG tablet Take 2 tablets (1,000 mg total) by mouth 4 (four) times daily as needed for muscle spasms (muscle spasm/pain). 09/05/16   Francine Graven, DO  ondansetron (ZOFRAN ODT) 4 MG disintegrating tablet Take 1 tablet (4 mg total) by mouth every 8 (eight) hours as needed for nausea or vomiting. 09/05/16   Francine Graven, DO  predniSONE (DELTASONE) 20 MG tablet Take 2 tablets (40 mg total) by mouth daily. Patient not taking: Reported on 01/17/2017 10/29/16   Triplett, Lynelle Smoke, PA-C  Vitamin D, Ergocalciferol, (DRISDOL) 50000 UNITS CAPS Take 50,000 Units by mouth every 7 (seven) days. Takes on Monday    [provider]    Family History Family History  Problem Relation Age of Onset  . Cancer Mother        ovarian  . Hypertension Mother   . Heart disease Mother   . Diabetes Mother   . Cancer Father        lung,  prostate  . Hypertension Father     Social History Social History  Substance Use Topics  . Smoking status: Never Smoker  . Smokeless tobacco: Never Used  . Alcohol use No     Allergies   Amoxicillin and Fluconazole   Review of Systems Review of Systems ROS: Statement: All systems negative except as marked or noted in the HPI; Constitutional: Negative for fever and chills. ; ; Eyes: Negative for eye pain, redness and discharge. ; ; ENMT: Negative for ear pain, hoarseness, sore throat. +ears/sinus congestion.; ; Cardiovascular: Negative for chest pain, palpitations, diaphoresis, dyspnea and peripheral edema. ; ; Respiratory: Negative for cough, wheezing and stridor. ; ; Gastrointestinal: Negative for nausea, vomiting, diarrhea, abdominal pain, blood in  stool, hematemesis, jaundice and rectal bleeding. . ; ; Genitourinary: Negative for dysuria, flank pain and hematuria. ; ; Musculoskeletal: Negative for back pain and neck pain. Negative for swelling and trauma.; ; Skin: Negative for pruritus, rash, abrasions, blisters, bruising and skin lesion.; ; Neuro: +frontal headache. Negative for lightheadedness and neck stiffness. Negative for weakness, altered level of consciousness, altered mental status, extremity weakness, paresthesias, involuntary movement, seizure and syncope.       Physical Exam Updated Vital Signs BP (!) 148/65 (BP Location: Left Arm)   Pulse 80   Temp 98.2 F (36.8 C) (Oral)   Resp 16   Ht 5\' 7"  (1.702 m)   Wt 122.5 kg (270 lb)   SpO2 100%   BMI 42.29 kg/m    BP (!) 143/73 (BP Location: Left Arm)   Pulse 73   Temp 98.2 F (36.8 C) (Oral)   Resp 16   Ht 5\' 7"  (1.702 m)   Wt 122.5 kg (270 lb)   SpO2 100%   BMI 42.29 kg/m    Physical Exam 1225: Physical examination:  Nursing notes reviewed; Vital signs and O2 SAT reviewed;  Constitutional: Well developed, Well nourished, Well hydrated, In no acute distress.; Head:  Normocephalic, atraumatic; Eyes: EOMI, PERRL, No scleral icterus; ENMT: TM's clear bilat. +edemetous nasal turbinates bilat with clear rhinorrhea. +tender to percuss frontal sinus. Mouth and pharynx normal, Mucous membranes moist; Neck: Supple, Full range of motion, No lymphadenopathy; Cardiovascular: Regular rate and rhythm, No gallop; Respiratory: Breath sounds clear & equal bilaterally, No wheezes.  Speaking full sentences with ease, Normal respiratory effort/excursion; Chest: Nontender, Movement normal; Abdomen: Soft, Nontender, Nondistended, Normal bowel sounds; Genitourinary: No CVA tenderness; Extremities: Pulses normal, No tenderness, No edema, No calf edema or asymmetry.; Neuro: AA&Ox3, Major CN grossly intact.  Speech clear. No gross focal motor or sensory deficits in extremities. Climbs on and off  stretcher easily by herself. Gait steady.; Skin: Color normal, Warm, Dry.   ED Treatments / Results  Labs (all labs ordered are listed, but only abnormal results are displayed)   EKG  EKG Interpretation None       Radiology   Procedures Procedures (including critical care time)  Medications Ordered in ED Medications  ibuprofen (ADVIL,MOTRIN) tablet 400 mg (400 mg Oral Given 04/09/17 1249)  acetaminophen (TYLENOL) tablet 650 mg (650 mg Oral Given 04/09/17 1249)     Initial Impression / Assessment and Plan / ED Course  I have reviewed the triage vital signs and the nursing notes.  Pertinent labs & imaging results that were available during my care of the patient were reviewed by me and considered in my medical decision making (see chart for details).  MDM Reviewed: previous chart, nursing note and vitals Reviewed previous: labs  Interpretation: labs and CT scan   Results for orders placed or performed during the hospital encounter of 04/09/17  I-stat Chem 8, ED  Result Value Ref Range   Sodium 140 135 - 145 mmol/L   Potassium 3.7 3.5 - 5.1 mmol/L   Chloride 105 101 - 111 mmol/L   BUN 10 6 - 20 mg/dL   Creatinine, Ser 0.60 0.44 - 1.00 mg/dL   Glucose, Bld 120 (H) 65 - 99 mg/dL   Calcium, Ion 1.02 (L) 1.15 - 1.40 mmol/L   TCO2 26 22 - 32 mmol/L   Hemoglobin 13.3 12.0 - 15.0 g/dL   HCT 39.0 36.0 - 46.0 %   Ct Head Wo Contrast Result Date: 04/09/2017 CLINICAL DATA:  Headache nausea EXAM: CT HEAD WITHOUT CONTRAST TECHNIQUE: Contiguous axial images were obtained from the base of the skull through the vertex without intravenous contrast. COMPARISON:  07/01/2016 FINDINGS: Brain: No evidence of acute infarction, hemorrhage, hydrocephalus, extra-axial collection or mass lesion/mass effect. Vascular: Negative for hyperdense vessel Skull: Negative Sinuses/Orbits: Negative Other: None IMPRESSION: Negative CT head Electronically Signed   By: Franchot Gallo M.D.   On: 04/09/2017 12:59      1325:  Pt has been typing on her cellphone while in the ED without distress. Feels better after meds and is ready to go home now. Tx symptomatically at this time. Dx and testing d/w pt.  Questions answered.  Verb understanding, agreeable to d/c home with outpt f/u.     Final Clinical Impressions(s) / ED Diagnoses   Final diagnoses:  None    New Prescriptions New Prescriptions   No medications on file     Francine Graven, DO 04/12/17 1550

## 2017-04-09 NOTE — Discharge Instructions (Signed)
Take over the counter tylenol and ibuprofen, as directed on packaging, as needed for headache.  Keep a headache diary. Take over the counter antihistamine and decongestant (safe for use with high blood pressure), as directed on packaging, for the next week.  Use over the counter normal saline nasal spray, with frequent nose blowing, several times per day for the next 2 weeks.  Call your regular medical doctor today to schedule a follow up appointment for next week.  Return to the Emergency Department immediately if worsening.

## 2017-04-09 NOTE — ED Notes (Signed)
Going to CT 

## 2017-04-09 NOTE — ED Triage Notes (Signed)
Patient states she was at her PCP yesterday BP check. Patient was told to come in to ED today due to high blood pressure. Patient states she had a headache yesterday. Vitals normal in triage NAD.

## 2017-07-05 ENCOUNTER — Other Ambulatory Visit (HOSPITAL_COMMUNITY): Payer: Self-pay | Admitting: Nurse Practitioner

## 2017-07-05 DIAGNOSIS — N6310 Unspecified lump in the right breast, unspecified quadrant: Secondary | ICD-10-CM

## 2017-07-13 ENCOUNTER — Encounter (HOSPITAL_COMMUNITY): Payer: Medicare PPO

## 2017-07-20 ENCOUNTER — Encounter (HOSPITAL_COMMUNITY): Payer: Self-pay

## 2017-07-20 ENCOUNTER — Ambulatory Visit (HOSPITAL_COMMUNITY)
Admission: RE | Admit: 2017-07-20 | Discharge: 2017-07-20 | Disposition: A | Discharge status: Home / Self Care | Payer: Medicare PPO | Source: Ambulatory Visit | Attending: Nurse Practitioner | Admitting: Nurse Practitioner

## 2017-07-20 DIAGNOSIS — N6312 Unspecified lump in the right breast, upper inner quadrant: Secondary | ICD-10-CM | POA: Diagnosis present

## 2017-07-20 DIAGNOSIS — R928 Other abnormal and inconclusive findings on diagnostic imaging of breast: Secondary | ICD-10-CM | POA: Insufficient documentation

## 2017-07-20 DIAGNOSIS — N6310 Unspecified lump in the right breast, unspecified quadrant: Secondary | ICD-10-CM

## 2017-10-21 ENCOUNTER — Encounter: Payer: Self-pay | Admitting: Internal Medicine

## 2017-11-15 ENCOUNTER — Ambulatory Visit (INDEPENDENT_AMBULATORY_CARE_PROVIDER_SITE_OTHER): Payer: Self-pay

## 2017-11-15 DIAGNOSIS — Z1211 Encounter for screening for malignant neoplasm of colon: Secondary | ICD-10-CM

## 2017-11-15 MED ORDER — PEG 3350-KCL-NA BICARB-NACL 420 G PO SOLR: 4000.0000 mL | ORAL | 0 refills | Status: DC

## 2017-11-15 NOTE — Patient Instructions (Signed)
Cathy Thomas   May 24, 1956 MRN: [id number]    Procedure Date: 01/18/18 Time to register: 9:15am Place to register: [facility] Procedure Time: 10:15am Scheduled provider: R. Garfield Cornea, MD  PREPARATION FOR COLONOSCOPY WITH TRI-LYTE SPLIT PREP  Please notify us immediately if you are diabetic, take iron supplements, or if you are on Coumadin or any other blood thinners.     You will need to purchase 1 fleet enema and 1 box of Bisacodyl 57m tablets.   2 DAYS BEFORE PROCEDURE:  DATE: 01/16/18  DAY: Sunday Begin clear liquid diet AFTER your lunch meal. NO SOLID FOODS after this point.  1 DAY BEFORE PROCEDURE:  DATE: 01/17/18   DAY: Monday Continue clear liquids the entire day - NO SOLID FOOD.     At 2:00 pm:  Take 2 Bisacodyl tablets.   At 4:00pm:  Start drinking your solution. Make sure you mix well per instructions on the bottle. Try to drink 1 (one) 8 ounce glass every 10-15 minutes until you have consumed HALF the jug. You should complete by 6:00pm.You must keep the left over solution refrigerated until completed next day.  Continue clear liquids. You must drink plenty of clear liquids to prevent dehyration and kidney failure. Nothing to eat or drink after midnight.  EXCEPTION: If you take medications for your heart, blood pressure or breathing, you may take these medications with a small amount of clear liquid.    DAY OF PROCEDURE:   DATE: 01/18/18   DAY: Tuesday    Five hours before your procedure time @ 5:15am:  Finish remaining amout of bowel prep, drinking 1 (one) 8 ounce glass every 10-15 minutes until complete. You have two hours to consume remaining prep.   Three hours before your procedure time _0 :15am:  Nothing by mouth.   At least one hour before going to the hospital:  Give yourself one Fleet enema. You may take your morning medications with sip of water unless we have instructed otherwise.      Please see below for Dietary Information.  CLEAR  LIQUIDS INCLUDE:  Water Jello (NOT red in color)   Ice Popsicles (NOT red in color)   Tea (sugar ok, no milk/cream) Powdered fruit flavored drinks  Coffee (sugar ok, no milk/cream) Gatorade/ Lemonade/ Kool-Aid  (NOT red in color)   Juice: apple, white grape, white cranberry Soft drinks  Clear bullion, consomme, broth (fat free beef/chicken/vegetable)  Carbonated beverages (any kind)  Strained chicken noodle soup Hard Candy   Remember: Clear liquids are liquids that will allow you to see your fingers on the other side of a clear glass. Be sure liquids are NOT red in color, and not cloudy, but CLEAR.  DO NOT EAT OR DRINK ANY OF THE FOLLOWING:  Dairy products of any kind   Cranberry juice Tomato juice / V8 juice   Grapefruit juice Orange juice     Red grape juice  Do not eat any solid foods, including such foods as: cereal, oatmeal, yogurt, fruits, vegetables, creamed soups, eggs, bread, crackers, pureed foods in a blender, etc.   HELPFUL HINTS FOR DRINKING PREP SOLUTION:   Make sure prep is extremely cold. Mix and refrigerate the the morning of the prep. You may also put in the freezer.   You may try mixing some Crystal Light or Country Time Lemonade if you prefer. Mix in small amounts; add more if necessary.  Try drinking through a straw  Rinse mouth with water or a mouthwash between  glasses, to remove after-taste.  Try sipping on a cold beverage /ice/ popsicles between glasses of prep.  Place a piece of sugar-free hard candy in mouth between glasses.  If you become nauseated, try consuming smaller amounts, or stretch out the time between glasses. Stop for 30-60 minutes, then slowly start back drinking.        OTHER INSTRUCTIONS  You will need a responsible adult at least 60 years of age to accompany you and drive you home. This person must remain in the waiting room during your procedure. The hospital will cancel your procedure if you do not have a responsible adult with  you.   1. Wear loose fitting clothing that is easily removed. 2. Leave jewelry and other valuables at home.  3. Remove all body piercing jewelry and leave at home. 4. Total time from sign-in until discharge is approximately 2-3 hours. 5. You should go home directly after your procedure and rest. You can resume normal activities the day after your procedure. 6. The day of your procedure you should not:  Drive  Make legal decisions  Operate machinery  Drink alcohol  Return to work   You may call the office (Dept: [phone]) before 5:00pm, or page the doctor on call ([phone]) after 5:00pm, for further instructions, if necessary.   Insurance Information YOU WILL NEED TO CHECK WITH YOUR INSURANCE COMPANY FOR THE BENEFITS OF COVERAGE YOU HAVE FOR THIS PROCEDURE.  UNFORTUNATELY, NOT ALL INSURANCE COMPANIES HAVE BENEFITS TO COVER ALL OR PART OF THESE TYPES OF PROCEDURES.  IT IS YOUR RESPONSIBILITY TO CHECK YOUR BENEFITS, HOWEVER, WE WILL BE GLAD TO ASSIST YOU WITH ANY CODES YOUR INSURANCE COMPANY MAY NEED.    PLEASE NOTE THAT MOST INSURANCE COMPANIES WILL NOT COVER A SCREENING COLONOSCOPY FOR PEOPLE UNDER THE AGE OF 50  IF YOU HAVE BCBS INSURANCE, YOU MAY HAVE BENEFITS FOR A SCREENING COLONOSCOPY BUT IF POLYPS ARE FOUND THE DIAGNOSIS WILL CHANGE AND THEN YOU MAY HAVE A DEDUCTIBLE THAT WILL NEED TO BE MET. SO PLEASE MAKE SURE YOU CHECK YOUR BENEFITS FOR A SCREENING COLONOSCOPY AS WELL AS A DIAGNOSTIC COLONOSCOPY.  

## 2017-11-15 NOTE — Progress Notes (Signed)
Gastroenterology Pre-Procedure Review  Request Date:11/15/17 Requesting Physician: Hollice Gong NP ( last tcs 05/28/2008 [facility] hyperplastic polyp)  PATIENT REVIEW QUESTIONS: The patient responded to the following health history questions as indicated:    1. Diabetes Melitis: no 2. Joint replacements in the past 12 months: no 3. Major health problems in the past 3 months: no 4. Has an artificial valve or MVP: no 5. Has a defibrillator: no 6. Has been advised in past to take antibiotics in advance of a procedure like teeth cleaning: no 7. Family history of colon cancer: no  8. Alcohol Use: no 9. History of sleep apnea: no  10. History of coronary artery or other vascular stents placed within the last 12 months: no 11. History of any prior anesthesia complications: no    MEDICATIONS & ALLERGIES:    Patient reports the following regarding taking any blood thinners:   Plavix? no Aspirin? yes (81mg ) Coumadin? no Brilinta? no Xarelto? no Eliquis? no Pradaxa? no Savaysa? no Effient? no  Patient confirms/reports the following medications:  Current Outpatient Medications  Medication Sig Dispense Refill  . aspirin EC 81 MG tablet Take 81 mg by mouth daily.    Marland Kitchen losartan (COZAAR) 50 MG tablet Take 50 mg by mouth daily.    Marland Kitchen LYRICA 75 MG capsule Take 150 mg by mouth 2 (two) times daily as needed (Patient always takes 2 daily but can take an extra capsule if necessary.).     Marland Kitchen magnesium gluconate (MAGONATE) 500 MG tablet Take 500 mg by mouth daily.     . Vitamin D, Ergocalciferol, (DRISDOL) 50000 units CAPS capsule Take 50,000 Units by mouth every 7 (seven) days.     No current facility-administered medications for this visit.     Patient confirms/reports the following allergies:  Allergies  Allergen Reactions  . Amoxicillin Itching  . Fluconazole Rash    burning sensation,     No orders of the defined types were placed in this encounter.   AUTHORIZATION INFORMATION Primary  Insurance: Josephine Igo,  [place] #: [id number] Pre-Cert / Josem Kaufmann required: no   SCHEDULE INFORMATION: Procedure has been scheduled as follows:  Date: 01/18/18, Time: 10:15 Location: [facility] Dr.[facility]our[facility]  This Gastroenterology Pre-Precedure Review Form is being routed to the following provider(s): Walden Field NP

## 2017-11-16 NOTE — Progress Notes (Signed)
Ok to schedule.

## 2018-01-07 ENCOUNTER — Other Ambulatory Visit: Payer: Self-pay

## 2018-01-07 ENCOUNTER — Emergency Department (HOSPITAL_COMMUNITY)
Admission: EM | Admit: 2018-01-07 | Discharge: 2018-01-07 | Disposition: A | Discharge status: Home / Self Care | Payer: Medicare PPO | Attending: Emergency Medicine | Admitting: Emergency Medicine

## 2018-01-07 ENCOUNTER — Emergency Department (HOSPITAL_COMMUNITY): Payer: Medicare PPO

## 2018-01-07 ENCOUNTER — Encounter (HOSPITAL_COMMUNITY): Payer: Self-pay | Admitting: Emergency Medicine

## 2018-01-07 DIAGNOSIS — Z79899 Other long term (current) drug therapy: Secondary | ICD-10-CM | POA: Insufficient documentation

## 2018-01-07 DIAGNOSIS — Z8673 Personal history of transient ischemic attack (TIA), and cerebral infarction without residual deficits: Secondary | ICD-10-CM | POA: Diagnosis not present

## 2018-01-07 DIAGNOSIS — E876 Hypokalemia: Secondary | ICD-10-CM | POA: Insufficient documentation

## 2018-01-07 DIAGNOSIS — M791 Myalgia, unspecified site: Secondary | ICD-10-CM

## 2018-01-07 DIAGNOSIS — M7918 Myalgia, other site: Secondary | ICD-10-CM | POA: Insufficient documentation

## 2018-01-07 DIAGNOSIS — Z7982 Long term (current) use of aspirin: Secondary | ICD-10-CM | POA: Diagnosis not present

## 2018-01-07 DIAGNOSIS — R609 Edema, unspecified: Secondary | ICD-10-CM

## 2018-01-07 DIAGNOSIS — R2243 Localized swelling, mass and lump, lower limb, bilateral: Secondary | ICD-10-CM | POA: Diagnosis present

## 2018-01-07 DIAGNOSIS — R600 Localized edema: Secondary | ICD-10-CM | POA: Insufficient documentation

## 2018-01-07 LAB — BASIC METABOLIC PANEL
Anion gap: 7 (ref 5–15)
BUN: 15 mg/dL (ref 6–20)
CHLORIDE: 105 mmol/L (ref 101–111)
CO2: 28 mmol/L (ref 22–32)
CREATININE: 0.59 mg/dL (ref 0.44–1.00)
Calcium: 9.2 mg/dL (ref 8.9–10.3)
GFR calc Af Amer: >60 mL/min (ref >60)
GLUCOSE: 91 mg/dL (ref 65–99)
Potassium: 3.4 mmol/L — ABNORMAL LOW (ref 3.5–5.1)
SODIUM: 140 mmol/L (ref 135–145)

## 2018-01-07 LAB — CBC WITH DIFFERENTIAL/PLATELET
Basophils Absolute: 0 10*3/uL (ref 0.0–0.1)
Basophils Relative: 1 %
EOS ABS: 0.2 10*3/uL (ref 0.0–0.7)
EOS PCT: 2 %
HCT: 39.2 % (ref 36.0–46.0)
Hemoglobin: 12 g/dL (ref 12.0–15.0)
LYMPHS ABS: 1.8 10*3/uL (ref 0.7–4.0)
LYMPHS PCT: 21 %
MCH: 24.5 pg — AB (ref 26.0–34.0)
MCHC: 30.6 g/dL (ref 30.0–36.0)
MCV: 80.2 fL (ref 78.0–100.0)
MONOS PCT: 9 %
Monocytes Absolute: 0.8 10*3/uL (ref 0.1–1.0)
Neutro Abs: 5.9 10*3/uL (ref 1.7–7.7)
Neutrophils Relative %: 67 %
PLATELETS: 362 10*3/uL (ref 150–400)
RBC: 4.89 MIL/uL (ref 3.87–5.11)
RDW: 14.8 % (ref 11.5–15.5)
WBC: 8.7 10*3/uL (ref 4.0–10.5)

## 2018-01-07 LAB — BRAIN NATRIURETIC PEPTIDE: B Natriuretic Peptide: 66 pg/mL (ref 0.0–100.0)

## 2018-01-07 LAB — CK: Total CK: 83 U/L (ref 38–234)

## 2018-01-07 LAB — D-DIMER, QUANTITATIVE: D-Dimer, Quant: 0.36 ug/mL-FEU (ref 0.00–0.50)

## 2018-01-07 MED ORDER — POTASSIUM CHLORIDE ER 10 MEQ PO TBCR: 10.0000 meq | EXTENDED_RELEASE_TABLET | Freq: Every day | ORAL | 0 refills | Status: DC

## 2018-01-07 MED ORDER — FUROSEMIDE 20 MG PO TABS: 20.0000 mg | ORAL_TABLET | Freq: Every day | ORAL | 0 refills | Status: DC

## 2018-01-07 MED ORDER — POTASSIUM CHLORIDE CRYS ER 20 MEQ PO TBCR
40.0000 meq | EXTENDED_RELEASE_TABLET | Freq: Once | ORAL | Status: AC
  Administered 2018-01-07: 40 meq via ORAL
  Filled 2018-01-07: qty 2

## 2018-01-07 NOTE — ED Provider Notes (Signed)
[facility] EMERGENCY DEPARTMENT Provider Note   CSN: [id number] Arrival date & time: 01/07/18  1544     History   Chief Complaint Chief Complaint  Patient presents with  . Leg Pain    HPI Cathy Thomas is a 62 y.o. female.  HPI Patient presents with bilateral lower extremity swelling and discomfort.  States she had a similar episode 2 years ago which improved with compression stockings.  She denies any known trauma.  Over the past week she has had some increased dyspnea with exertion.  She denies cough or chest pain.  No fever or chills. Past Medical History:  Diagnosis Date  . Fibromyalgia   . Headache   . Kidney stone   . Stroke ([place])   . TIA (transient ischemic attack)     Patient Active Problem List   Diagnosis Date Noted  . BV (bacterial vaginosis) 04/24/2015    Past Surgical History:  Procedure Laterality Date  . ABDOMINAL HYSTERECTOMY    . BALLOON DILATION  12/16/2011   Procedure: BALLOON DILATION;  Surgeon: Marissa Nestle, MD;  Location: [facility];  Service: Urology;  Laterality: Left;  . CESAREAN SECTION    . KNEE ARTHROSCOPY    . STONE EXTRACTION WITH BASKET  12/16/2011   Procedure: STONE EXTRACTION WITH BASKET;  Surgeon: Marissa Nestle, MD;  Location: [facility];  Service: Urology;  Laterality: Left;     OB History   None      Home Medications    Prior to Admission medications   Medication Sig Start Date End Date Taking? Authorizing Provider  aspirin EC 81 MG tablet Take 81 mg by mouth daily.   Yes [provider]  losartan (COZAAR) 50 MG tablet Take 50 mg by mouth daily.   Yes [provider]  LYRICA 75 MG capsule Take 150 mg by mouth 2 (two) times daily as needed (Patient always takes 2 daily but can take an extra capsule if necessary.).  05/31/14  Yes [provider]  magnesium gluconate (MAGONATE) 500 MG tablet Take 500 mg by mouth daily.    Yes [provider]  Vitamin D, Ergocalciferol, (DRISDOL) 50000  units CAPS capsule Take 50,000 Units by mouth every 7 (seven) days.   Yes [provider]  furosemide (LASIX) 20 MG tablet Take 1 tablet (20 mg total) by mouth daily. 01/07/18   Julianne Rice, MD  potassium chloride (K-DUR) 10 MEQ tablet Take 1 tablet (10 mEq total) by mouth daily. 01/07/18   Julianne Rice, MD    Family History Family History  Problem Relation Age of Onset  . Cancer Mother        ovarian  . Hypertension Mother   . Heart disease Mother   . Diabetes Mother   . Cancer Father        lung, prostate  . Hypertension Father     Social History Social History   Tobacco Use  . Smoking status: Never Smoker  . Smokeless tobacco: Never Used  Substance Use Topics  . Alcohol use: No  . Drug use: No     Allergies   Amoxicillin and Fluconazole   Review of Systems Review of Systems  Constitutional: Negative for chills, fatigue and fever.  Respiratory: Positive for shortness of breath. Negative for cough.   Cardiovascular: Positive for leg swelling. Negative for chest pain and palpitations.  Gastrointestinal: Negative for abdominal pain, constipation, diarrhea, nausea and vomiting.  Musculoskeletal: Positive for myalgias. Negative for back pain, neck  pain and neck stiffness.  Skin: Negative for rash and wound.  Neurological: Negative for dizziness, weakness, light-headedness, numbness and headaches.  All other systems reviewed and are negative.    Physical Exam Updated Vital Signs BP (!) 146/66 (BP Location: Right Arm)   Pulse 64   Temp 97.9 F (36.6 C) (Oral)   Resp 18   Ht 5\' 7"  (1.702 m)   Wt 122.5 kg (270 lb)   SpO2 100%   BMI 42.29 kg/m   Physical Exam  Constitutional: She is oriented to person, place, and time. She appears well-developed and well-nourished. No distress.  HENT:  Head: Normocephalic and atraumatic.  Mouth/Throat: Oropharynx is clear and moist. No oropharyngeal exudate.  Eyes: Pupils are equal, round, and reactive to light.  EOM are normal.  Neck: Normal range of motion. Neck supple. No JVD present.  Cardiovascular: Normal rate and regular rhythm. Exam reveals no gallop and no friction rub.  No murmur heard. Pulmonary/Chest: Effort normal and breath sounds normal. No stridor. No respiratory distress. She has no wheezes. She has no rales. She exhibits no tenderness.  Abdominal: Soft. Bowel sounds are normal. There is no tenderness. There is no rebound and no guarding.  Musculoskeletal: Normal range of motion. She exhibits edema. She exhibits no tenderness.  Mild bilateral nonpitting swelling to lower extremities.  No asymmetry or obvious tenderness.  Distal pulses are 2+.  Lymphadenopathy:    She has no cervical adenopathy.  Neurological: She is alert and oriented to person, place, and time.  Moving all extremities without focal deficit.  Sensation intact.  Skin: Skin is warm and dry. Capillary refill takes less than 2 seconds. No rash noted. She is not diaphoretic. No erythema.  Psychiatric: She has a normal mood and affect. Her behavior is normal.  Nursing note and vitals reviewed.    ED Treatments / Results  Labs (all labs ordered are listed, but only abnormal results are displayed) Labs Reviewed  CBC WITH DIFFERENTIAL/PLATELET - Abnormal; Notable for the following components:      Result Value   MCH 24.5 (*)    All other components within normal limits  BASIC METABOLIC PANEL - Abnormal; Notable for the following components:   Potassium 3.4 (*)    All other components within normal limits  BRAIN NATRIURETIC PEPTIDE  CK  D-DIMER, QUANTITATIVE (NOT AT [facility])    EKG EKG Interpretation  Date/Time:  Friday January 07 2018 16:31:00 EDT Ventricular Rate:  75 PR Interval:  150 QRS Duration: 82 QT Interval:  404 QTC Calculation: 451 R Axis:   32 Text Interpretation:  Normal sinus rhythm Normal ECG Confirmed by Julianne Rice [phone]) on 01/07/2018 7:37:22 PM   Radiology No results  found.  Procedures Procedures (including critical care time)  Medications Ordered in ED Medications  potassium chloride SA (K-DUR,KLOR-CON) CR tablet 40 mEq (40 mEq Oral Given 01/07/18 2013)     Initial Impression / Assessment and Plan / ED Course  I have reviewed the triage vital signs and the nursing notes.  Pertinent labs & imaging results that were available during my care of the patient were reviewed by me and considered in my medical decision making (see chart for details).     Potassium is mildly low.  Given oral replacement.  We will give few days of low-dose Lasix and ongoing potassium replacement.  Advised to continue compression stockings.  Follow-up with primary physician and return precautions given.  Final Clinical Impressions(s) / ED Diagnoses   Final diagnoses:  Myalgia  Peripheral edema  Hypokalemia    ED Discharge Orders        Ordered    potassium chloride (K-DUR) 10 MEQ tablet  Daily     01/07/18 2038    furosemide (LASIX) 20 MG tablet  Daily     01/07/18 2039       Julianne Rice, MD 01/10/18 1717

## 2018-01-07 NOTE — ED Triage Notes (Signed)
Patient complaining of bilateral leg pain and swelling x 2 days. Also states she has been short of breath x 1 week.

## 2018-01-18 ENCOUNTER — Encounter (HOSPITAL_COMMUNITY)
Admission: RE | Disposition: A | Discharge status: Home / Self Care | Payer: Self-pay | Source: Ambulatory Visit | Attending: Internal Medicine

## 2018-01-18 ENCOUNTER — Encounter (HOSPITAL_COMMUNITY): Payer: Self-pay | Admitting: *Deleted

## 2018-01-18 ENCOUNTER — Other Ambulatory Visit: Payer: Self-pay

## 2018-01-18 ENCOUNTER — Ambulatory Visit (HOSPITAL_COMMUNITY)
Admission: RE | Admit: 2018-01-18 | Discharge: 2018-01-18 | Disposition: A | Discharge status: Home / Self Care | Payer: Medicare PPO | Source: Ambulatory Visit | Attending: Internal Medicine | Admitting: Internal Medicine

## 2018-01-18 DIAGNOSIS — Z1211 Encounter for screening for malignant neoplasm of colon: Secondary | ICD-10-CM

## 2018-01-18 DIAGNOSIS — D128 Benign neoplasm of rectum: Secondary | ICD-10-CM | POA: Diagnosis not present

## 2018-01-18 DIAGNOSIS — K621 Rectal polyp: Secondary | ICD-10-CM | POA: Diagnosis not present

## 2018-01-18 DIAGNOSIS — Z7982 Long term (current) use of aspirin: Secondary | ICD-10-CM | POA: Insufficient documentation

## 2018-01-18 DIAGNOSIS — I10 Essential (primary) hypertension: Secondary | ICD-10-CM | POA: Insufficient documentation

## 2018-01-18 DIAGNOSIS — Z8673 Personal history of transient ischemic attack (TIA), and cerebral infarction without residual deficits: Secondary | ICD-10-CM | POA: Diagnosis not present

## 2018-01-18 DIAGNOSIS — M797 Fibromyalgia: Secondary | ICD-10-CM | POA: Diagnosis not present

## 2018-01-18 HISTORY — PX: COLONOSCOPY: SHX5424

## 2018-01-18 HISTORY — DX: Essential (primary) hypertension: I10

## 2018-01-18 SURGERY — COLONOSCOPY
Anesthesia: Moderate Sedation

## 2018-01-18 MED ORDER — ONDANSETRON HCL 4 MG/2ML IJ SOLN
INTRAMUSCULAR | Status: DC | PRN
  Administered 2018-01-18: 4 mg via INTRAVENOUS

## 2018-01-18 MED ORDER — SODIUM CHLORIDE 0.9 % IV SOLN
INTRAVENOUS | Status: DC
  Administered 2018-01-18: 09:00:00 via INTRAVENOUS

## 2018-01-18 MED ORDER — MEPERIDINE HCL 100 MG/ML IJ SOLN
INTRAMUSCULAR | Status: AC
  Filled 2018-01-18: qty 2

## 2018-01-18 MED ORDER — MEPERIDINE HCL 100 MG/ML IJ SOLN
INTRAMUSCULAR | Status: DC | PRN
  Administered 2018-01-18: 50 mg via INTRAVENOUS
  Administered 2018-01-18: 25 mg via INTRAVENOUS

## 2018-01-18 MED ORDER — MIDAZOLAM HCL 5 MG/5ML IJ SOLN
INTRAMUSCULAR | Status: DC | PRN
  Administered 2018-01-18 (×2): 1 mg via INTRAVENOUS
  Administered 2018-01-18: 2 mg via INTRAVENOUS

## 2018-01-18 MED ORDER — ONDANSETRON HCL 4 MG/2ML IJ SOLN
INTRAMUSCULAR | Status: AC
  Filled 2018-01-18: qty 2

## 2018-01-18 MED ORDER — MIDAZOLAM HCL 5 MG/5ML IJ SOLN
INTRAMUSCULAR | Status: AC
  Filled 2018-01-18: qty 10

## 2018-01-18 MED ORDER — SIMETHICONE 40 MG/0.6ML PO SUSP
ORAL | Status: DC | PRN
  Administered 2018-01-18: 10:00:00

## 2018-01-18 NOTE — Discharge Instructions (Signed)
°Colonoscopy °Discharge Instructions ° °Read the instructions outlined below and refer to this sheet in the next few weeks. These discharge instructions provide you with general information on caring for yourself after you leave the hospital. Your doctor may also give you specific instructions. While your treatment has been planned according to the most current medical practices available, unavoidable complications occasionally occur. If you have any problems or questions after discharge, call Dr. Rourk at [phone]. °ACTIVITY °· You may resume your regular activity, but move at a slower pace for the next 24 hours.  °· Take frequent rest periods for the next 24 hours.  °· Walking will help get rid of the air and reduce the bloated feeling in your belly (abdomen).  °· No driving for 24 hours (because of the medicine (anesthesia) used during the test).   °· Do not sign any important legal documents or operate any machinery for 24 hours (because of the anesthesia used during the test).  °NUTRITION °· Drink plenty of fluids.  °· You may resume your normal diet as instructed by your doctor.  °· Begin with a light meal and progress to your normal diet. Heavy or fried foods are harder to digest and may make you feel sick to your stomach (nauseated).  °· Avoid alcoholic beverages for 24 hours or as instructed.  °MEDICATIONS °· You may resume your normal medications unless your doctor tells you otherwise.  °WHAT YOU CAN EXPECT TODAY °· Some feelings of bloating in the abdomen.  °· Passage of more gas than usual.  °· Spotting of blood in your stool or on the toilet paper.  °IF YOU HAD POLYPS REMOVED DURING THE COLONOSCOPY: °· No aspirin products for 7 days or as instructed.  °· No alcohol for 7 days or as instructed.  °· Eat a soft diet for the next 24 hours.  °FINDING OUT THE RESULTS OF YOUR TEST °Not all test results are available during your visit. If your test results are not back during the visit, make an appointment  with your caregiver to find out the results. Do not assume everything is normal if you have not heard from your caregiver or the medical facility. It is important for you to follow up on all of your test results.  °SEEK IMMEDIATE MEDICAL ATTENTION IF: °· You have more than a spotting of blood in your stool.  °· Your belly is swollen (abdominal distention).  °· You are nauseated or vomiting.  °· You have a temperature over 101.  °· You have abdominal pain or discomfort that is severe or gets worse throughout the day.  ° ° ° °Colon polyp information provided ° °Further recommendations to follow  Pending review of pathology report ° ° ° ° ° ° °Colon Polyps °Polyps are tissue growths inside the body. Polyps can grow in many places, including the large intestine (colon). A polyp may be a round bump or a mushroom-shaped growth. You could have one polyp or several. °Most colon polyps are noncancerous (benign). However, some colon polyps can become cancerous over time. °What are the causes? °The exact cause of colon polyps is not known. °What increases the risk? °This condition is more likely to develop in people who: °· Have a family history of colon cancer or colon polyps. °· Are older than 50 or older than 45 if they are African American. °· Have inflammatory bowel disease, such as ulcerative colitis or Crohn disease. °· Are overweight. °· Smoke cigarettes. °· Do not get enough   exercise. °· Drink too much alcohol. °· Eat a diet that is: °? High in fat and red meat. °? Low in fiber. °· Had childhood cancer that was treated with abdominal radiation. ° °What are the signs or symptoms? °Most polyps do not cause symptoms. If you have symptoms, they may include: °· Blood coming from your rectum when having a bowel movement. °· Blood in your stool. The stool may look dark red or black. °· A change in bowel habits, such as constipation or diarrhea. ° °How is this diagnosed? °This condition is diagnosed with a colonoscopy. This  is a procedure that uses a lighted, flexible scope to look at the inside of your colon. °How is this treated? °Treatment for this condition involves removing any polyps that are found. Those polyps will then be tested for cancer. If cancer is found, your health care provider will talk to you about options for colon cancer treatment. °Follow these instructions at home: °Diet °· Eat plenty of fiber, such as fruits, vegetables, and whole grains. °· Eat foods that are high in calcium and vitamin D, such as milk, cheese, yogurt, eggs, liver, fish, and broccoli. °· Limit foods high in fat, red meats, and processed meats, such as hot dogs, sausage, bacon, and lunch meats. °· Maintain a healthy weight, or lose weight if recommended by your health care provider. °General instructions °· Do not smoke cigarettes. °· Do not drink alcohol excessively. °· Keep all follow-up visits as told by your health care provider. This is important. This includes keeping regularly scheduled colonoscopies. Talk to your health care provider about when you need a colonoscopy. °· Exercise every day or as told by your health care provider. °Contact a health care provider if: °· You have new or worsening bleeding during a bowel movement. °· You have new or increased blood in your stool. °· You have a change in bowel habits. °· You unexpectedly lose weight. °This information is not intended to replace advice given to you by your health care provider. Make sure you discuss any questions you have with your health care provider. °Document Released: 04/15/2004 Document Revised: 12/26/2015 Document Reviewed: 06/10/2015 °Elsevier Interactive Patient Education © 2018 [employer] ° °

## 2018-01-18 NOTE — H&P (Signed)
@LOGO @   Primary Care Physician:  Renee Rival, NP Primary Gastroenterologist:  Dr. Gala Romney  Pre-Procedure History & Physical: HPI:  Cathy Thomas is a 62 y.o. female is here for a screening colonoscopy. No bowel symptoms. Hyperplastic  Polyp removed 10 years ago.  Past Medical History:  Diagnosis Date  . Fibromyalgia   . Headache   . Hypertension   . Kidney stone   . Stroke ([place])   . TIA (transient ischemic attack)     Past Surgical History:  Procedure Laterality Date  . ABDOMINAL HYSTERECTOMY    . BALLOON DILATION  12/16/2011   Procedure: BALLOON DILATION;  Surgeon: Marissa Nestle, MD;  Location: [facility];  Service: Urology;  Laterality: Left;  . CESAREAN SECTION    . KNEE ARTHROSCOPY    . STONE EXTRACTION WITH BASKET  12/16/2011   Procedure: STONE EXTRACTION WITH BASKET;  Surgeon: Marissa Nestle, MD;  Location: [facility];  Service: Urology;  Laterality: Left;    Prior to Admission medications   Medication Sig Start Date End Date Taking? Authorizing Provider  aspirin EC 81 MG tablet Take 81 mg by mouth daily.   Yes [provider]  furosemide (LASIX) 20 MG tablet Take 1 tablet (20 mg total) by mouth daily. 01/07/18  Yes Julianne Rice, MD  losartan (COZAAR) 50 MG tablet Take 50 mg by mouth daily.   Yes [provider]  LYRICA 75 MG capsule Take 150 mg by mouth 2 (two) times daily as needed (Patient always takes 2 daily but can take an extra capsule if necessary.).  05/31/14  Yes [provider]  potassium chloride (K-DUR) 10 MEQ tablet Take 1 tablet (10 mEq total) by mouth daily. 01/07/18  Yes Julianne Rice, MD  Vitamin D, Ergocalciferol, (DRISDOL) 50000 units CAPS capsule Take 50,000 Units by mouth every 7 (seven) days.   Yes [provider]  magnesium gluconate (MAGONATE) 500 MG tablet Take 500 mg by mouth daily.     [provider]    Allergies as of 11/15/2017 - Review Complete 11/15/2017  Allergen Reaction Noted  .  Amoxicillin Itching 12/04/2011  . Fluconazole Rash 12/01/2014    Family History  Problem Relation Age of Onset  . Cancer Mother        ovarian  . Hypertension Mother   . Heart disease Mother   . Diabetes Mother   . Cancer Father        lung, prostate  . Hypertension Father   . Colon cancer Neg Hx     Social History   Socioeconomic History  . Marital status: Divorced    Spouse name: Not on file  . Number of children: Not on file  . Years of education: Not on file  . Highest education level: Not on file  Occupational History  . Not on file  Social Needs  . Financial resource strain: Not on file  . Food insecurity:    Worry: Not on file    Inability: Not on file  . Transportation needs:    Medical: Not on file    Non-medical: Not on file  Tobacco Use  . Smoking status: Never Smoker  . Smokeless tobacco: Never Used  Substance and Sexual Activity  . Alcohol use: No  . Drug use: No  . Sexual activity: Not Currently  Lifestyle  . Physical activity:    Days per week: Not on file    Minutes per session: Not on file  . Stress:  Not on file  Relationships  . Social connections:    Talks on phone: Not on file    Gets together: Not on file    Attends religious service: Not on file    Active member of club or organization: Not on file    Attends meetings of clubs or organizations: Not on file    Relationship status: Not on file  . Intimate partner violence:    Fear of current or ex partner: Not on file    Emotionally abused: Not on file    Physically abused: Not on file    Forced sexual activity: Not on file  Other Topics Concern  . Not on file  Social History Narrative  . Not on file    Review of Systems: See HPI, otherwise negative ROS  Physical Exam: BP 126/62   Pulse 83   Temp (!) 97.5 F (36.4 C) (Oral)   Resp (!) 21   Ht 5\' 7"  (1.702 m)   Wt 270 lb (122.5 kg)   SpO2 99%   BMI 42.29 kg/m  General:   Alert,  Well-developed, well-nourished, pleasant  and cooperative in NAD Lungs:  Clear throughout to auscultation.   No wheezes, crackles, or rhonchi. No acute distress. Heart:  Regular rate and rhythm; no murmurs, clicks, rubs,  or gallops. Abdomen:  Soft, nontender and nondistended. No masses, hepatosplenomegaly or hernias noted. Normal bowel sounds, without guarding, and without rebound.    Impression/Plan: Cathy Thomas is now here to undergo a screening colonoscopy.  Average risk screening examination.  Risks, benefits, limitations, imponderables and alternatives regarding colonoscopy have been reviewed with the patient. Questions have been answered. All parties agreeable.     Notice:  This dictation was prepared with Dragon dictation along with smaller phrase technology. Any transcriptional errors that result from this process are unintentional and may not be corrected upon review.

## 2018-01-18 NOTE — Op Note (Signed)
[facility] Patient Name: Cathy Thomas Procedure Date: 01/18/2018 9:11 AM MRN: [id number] Date of Birth: [birth year] Attending MD: Norvel Richards , MD CSN: [id number] Age: 62 Admit Type: Outpatient Procedure:                Colonoscopy Indications:              Screening for colorectal malignant neoplasm Providers:                Norvel Richards, MD, Klingerman Rao, RN, Aram Candela Referring MD:              Medicines:                Midazolam 4 mg IV, Meperidine 75 mg IV Complications:            No immediate complications. Estimated Blood Loss:     Estimated blood loss was minimal. Procedure:                Pre-Anesthesia Assessment:                           - Prior to the procedure, a History and Physical                            was performed, and patient medications and                            allergies were reviewed. The patient's tolerance of                            previous anesthesia was also reviewed. The risks                            and benefits of the procedure and the sedation                            options and risks were discussed with the patient.                            All questions were answered, and informed consent                            was obtained. Prior Anticoagulants: The patient has                            taken no previous anticoagulant or antiplatelet                            agents. ASA Grade Assessment: II - A patient with                            mild systemic disease. After reviewing the risks  and benefits, the patient was deemed in                            satisfactory condition to undergo the procedure.                           After obtaining informed consent, the colonoscope                            was passed under direct vision. Throughout the                            procedure, the patient's blood pressure, pulse, and        oxygen saturations were monitored continuously. The                            EC-[id number] ([id number]) scope was introduced through                            the anus and advanced to the the cecum, identified                            by appendiceal orifice and ileocecal valve. The                            colonoscopy was performed without difficulty. The                            patient tolerated the procedure well. The quality                            of the bowel preparation was adequate. The entire                            colon was examined. The ileocecal valve,                            appendiceal orifice, and rectum were photographed. Scope In: 9:35:50 AM Scope Out: 9:46:53 AM Scope Withdrawal Time: 0 hours 8 minutes 9 seconds  Total Procedure Duration: 0 hours 11 minutes 3 seconds  Findings:      The perianal and digital rectal examinations were normal.      A 5 mm polyp was found in the rectum. The polyp was semi-pedunculated.       The polyp was removed with a cold snare. Resection and retrieval were       complete. Estimated blood loss was minimal.      The exam was otherwise without abnormality on direct and retroflexion       views. Impression:               - One 5 mm polyp in the rectum, removed with a cold                            snare. Resected and retrieved.                           -  The examination was otherwise normal on direct                            and retroflexion views. Moderate Sedation:      Moderate (conscious) sedation was administered by the endoscopy nurse       and supervised by the endoscopist. The following parameters were       monitored: oxygen saturation, heart rate, blood pressure, respiratory       rate, EKG, adequacy of pulmonary ventilation, and response to care.       Total physician intraservice time was 16 minutes. Recommendation:           - Patient has a contact number available for                             emergencies. The signs and symptoms of potential                            delayed complications were discussed with the                            patient. Return to normal activities tomorrow.                            Written discharge instructions were provided to the                            patient.                           - Resume previous diet.                           - Continue present medications.                           - Repeat colonoscopy date to be determined after                            pending pathology results are reviewed for                            surveillance based on pathology results.                           - Return to GI office (date not yet determined). Procedure Code(s):        --- Professional ---                           [phone], Colonoscopy, flexible; with removal of                            tumor(s), polyp(s), or other lesion(s) by snare                            technique  G0500, Moderate sedation services provided by the                            same physician or other qualified health care                            professional performing a gastrointestinal                            endoscopic service that sedation supports,                            requiring the presence of an independent trained                            observer to assist in the monitoring of the                            patient's level of consciousness and physiological                            status; initial 15 minutes of intra-service time;                            patient age 12 years or older (additional time may                            be reported with [phone], as appropriate) Diagnosis Code(s):        --- Professional ---                           Z12.11, Encounter for screening for malignant                            neoplasm of colon                           K62.1, Rectal polyp CPT copyright 2017 American Medical  Association. All rights reserved. The codes documented in this report are preliminary and upon coder review may  be revised to meet current compliance requirements. Cristopher Estimable. Erven Ramson, MD Norvel Richards, MD 01/18/2018 9:52:36 AM This report has been signed electronically. Number of Addenda: 0

## 2018-01-23 ENCOUNTER — Encounter: Payer: Self-pay | Admitting: Internal Medicine

## 2018-01-24 ENCOUNTER — Encounter (HOSPITAL_COMMUNITY): Payer: Self-pay | Admitting: Internal Medicine

## 2018-04-06 ENCOUNTER — Ambulatory Visit: Payer: Medicare PPO | Admitting: Orthopaedic Surgery

## 2018-04-22 ENCOUNTER — Ambulatory Visit (INDEPENDENT_AMBULATORY_CARE_PROVIDER_SITE_OTHER): Payer: Medicare PPO

## 2018-04-22 ENCOUNTER — Ambulatory Visit: Payer: Medicare PPO | Admitting: Orthopedic Surgery

## 2018-04-22 VITALS — BP 135/84 | HR 75 | Ht 67.0 in | Wt 266.0 lb

## 2018-04-22 DIAGNOSIS — M25562 Pain in left knee: Secondary | ICD-10-CM

## 2018-04-22 DIAGNOSIS — G8929 Other chronic pain: Secondary | ICD-10-CM

## 2018-04-22 DIAGNOSIS — Z6841 Body Mass Index (BMI) 40.0 and over, adult: Secondary | ICD-10-CM | POA: Diagnosis not present

## 2018-04-22 DIAGNOSIS — M1712 Unilateral primary osteoarthritis, left knee: Secondary | ICD-10-CM | POA: Diagnosis not present

## 2018-04-22 NOTE — Patient Instructions (Signed)

## 2018-04-22 NOTE — Progress Notes (Signed)
NEW PATIENT OFFICE VISI  Chief Complaint  Patient presents with  . Knee Pain    Left knee pain recently had injection feeling better.    62 years old status post bilateral knee arthroscopies previously worked a good year presents with a history of recent onset of acute left knee pain which became severe enough for her to seek medical attention she received an intramuscular injection of cortisone and she says her knee does not hurt at this time but it does intermittently give her some problems.  She has dull aching pain both knees left greater than right and when it is hurting she notes that the knees are stiff.  She has had ongoing pain for several years now due to onset of pain 2 to 3 weeks ago.   Review of Systems  Constitutional: Negative for fever.  Respiratory: Negative for shortness of breath.   Cardiovascular: Negative for chest pain.  Musculoskeletal: Positive for joint pain.  Skin: Negative.   Neurological: Positive for tingling.     Past Medical History:  Diagnosis Date  . Fibromyalgia   . Headache   . Hypertension   . Kidney stone   . Stroke ([place])   . TIA (transient ischemic attack)     Past Surgical History:  Procedure Laterality Date  . ABDOMINAL HYSTERECTOMY    . BALLOON DILATION  12/16/2011   Procedure: BALLOON DILATION;  Surgeon: Marissa Nestle, MD;  Location: [facility];  Service: Urology;  Laterality: Left;  . CESAREAN SECTION    . COLONOSCOPY N/A 01/18/2018   Procedure: COLONOSCOPY;  Surgeon: Daneil Dolin, MD;  Location: [facility];  Service: Endoscopy;  Laterality: N/A;  10:15  . KNEE ARTHROSCOPY    . STONE EXTRACTION WITH BASKET  12/16/2011   Procedure: STONE EXTRACTION WITH BASKET;  Surgeon: Marissa Nestle, MD;  Location: [facility];  Service: Urology;  Laterality: Left;    Family History  Problem Relation Age of Onset  . Cancer Mother        ovarian  . Hypertension Mother   . Heart disease Mother   . Diabetes Mother   . Cancer Father     lung, prostate  . Hypertension Father   . Colon cancer Neg Hx    Social History   Tobacco Use  . Smoking status: Never Smoker  . Smokeless tobacco: Never Used  Substance Use Topics  . Alcohol use: No  . Drug use: No    Allergies  Allergen Reactions  . Amoxicillin Itching    Has patient had a PCN reaction causing immediate rash, facial/tongue/throat swelling, SOB or lightheadedness with hypotension: Unknown Has patient had a PCN reaction causing severe rash involving mucus membranes or skin necrosis: Unknown Has patient had a PCN reaction that required hospitalization: Unknown Has patient had a PCN reaction occurring within the last 10 years: Unknown If all of the above answers are "NO", then may proceed with Cephalosporin use.   Marland Kitchen Fluconazole Rash    burning sensation,     Current Meds  Medication Sig  . aspirin EC 81 MG tablet Take 81 mg by mouth daily.  Marland Kitchen losartan (COZAAR) 50 MG tablet Take 50 mg by mouth daily.  Marland Kitchen LYRICA 75 MG capsule Take 150 mg by mouth 2 (two) times daily as needed (Patient always takes 2 daily but can take an extra capsule if necessary.).   Marland Kitchen magnesium gluconate (MAGONATE) 500 MG tablet Take 500 mg by mouth daily.   . potassium chloride (  K-DUR) 10 MEQ tablet Take 1 tablet (10 mEq total) by mouth daily.  . Vitamin D, Ergocalciferol, (DRISDOL) 50000 units CAPS capsule Take 50,000 Units by mouth every 7 (seven) days.    BP 135/84   Pulse 75   Ht 5\' 7"  (1.702 m)   Wt 266 lb (120.7 kg)   BMI 41.66 kg/m   Physical Exam  Constitutional: She is oriented to person, place, and time. She appears well-developed and well-nourished.  Neurological: She is alert and oriented to person, place, and time.  Psychiatric: She has a normal mood and affect. Judgment normal.  Vitals reviewed.   Right Knee Exam   Muscle Strength  The patient has normal right knee strength.  Tenderness  The patient is experiencing no tenderness.   Range of Motion  Extension:  normal  Flexion:  110 normal   Tests  McMurray:  Medial - negative Lateral - negative Varus: negative Valgus: negative Drawer:  Anterior - negative    Posterior - negative  Other  Erythema: absent Scars: absent Sensation: normal Pulse: present Swelling: none   Left Knee Exam   Muscle Strength  The patient has normal left knee strength.  Tenderness  The patient is experiencing no tenderness.   Range of Motion  Extension: normal  Flexion: normal Left knee flexion: 115.   Tests  McMurray:  Medial - negative Lateral - negative Varus: negative Valgus: negative Drawer:  Anterior - negative     Posterior - negative  Other  Erythema: absent Scars: absent Sensation: normal Pulse: present Swelling: none        MEDICAL DECISION SECTION  Xrays were done at In the office  My independent reading of xrays:  3 views of the left knee Joint space narrowing on each side of the joint severe patellofemoral joint space narrowing multiple areas with osteophytes there are some subchondral sclerotic areas of bone overall alignment shows no gross deformity  Encounter Diagnosis  Name Primary?  . Chronic pain of left knee Yes   The patient meets the AMA guidelines for Morbid (severe) obesity with a BMI > 40.0 and I have recommended weight loss.   PLAN: (Rx., injectx, surgery, frx, mri/ct) As she is not hurting at this time we gave her routine counseling about weight loss exercise we can inject the knees when they become symptomatic and when that stops working if her weight is appropriate she can undergo knee replacement surgery  No orders of the defined types were placed in this encounter.   Arther Abbott, MD  04/22/2018 9:43 AM

## 2018-05-22 ENCOUNTER — Encounter (HOSPITAL_COMMUNITY): Payer: Self-pay | Admitting: Emergency Medicine

## 2018-05-22 ENCOUNTER — Emergency Department (HOSPITAL_COMMUNITY): Payer: Medicare PPO

## 2018-05-22 ENCOUNTER — Emergency Department (HOSPITAL_COMMUNITY)
Admission: EM | Admit: 2018-05-22 | Discharge: 2018-05-23 | Disposition: A | Discharge status: Home / Self Care | Payer: Medicare PPO | Attending: Emergency Medicine | Admitting: Emergency Medicine

## 2018-05-22 ENCOUNTER — Other Ambulatory Visit: Payer: Self-pay

## 2018-05-22 DIAGNOSIS — R1031 Right lower quadrant pain: Secondary | ICD-10-CM | POA: Diagnosis present

## 2018-05-22 DIAGNOSIS — Z7982 Long term (current) use of aspirin: Secondary | ICD-10-CM | POA: Diagnosis not present

## 2018-05-22 DIAGNOSIS — R1011 Right upper quadrant pain: Secondary | ICD-10-CM

## 2018-05-22 DIAGNOSIS — Z8673 Personal history of transient ischemic attack (TIA), and cerebral infarction without residual deficits: Secondary | ICD-10-CM | POA: Diagnosis not present

## 2018-05-22 DIAGNOSIS — Z79899 Other long term (current) drug therapy: Secondary | ICD-10-CM | POA: Diagnosis not present

## 2018-05-22 DIAGNOSIS — I10 Essential (primary) hypertension: Secondary | ICD-10-CM | POA: Insufficient documentation

## 2018-05-22 LAB — URINALYSIS, ROUTINE W REFLEX MICROSCOPIC
BILIRUBIN URINE: NEGATIVE
Glucose, UA: NEGATIVE mg/dL
Hgb urine dipstick: NEGATIVE
Ketones, ur: NEGATIVE mg/dL
Leukocytes, UA: NEGATIVE
NITRITE: NEGATIVE
Protein, ur: NEGATIVE mg/dL
SPECIFIC GRAVITY, URINE: 1.026 (ref 1.005–1.030)
pH: 5 (ref 5.0–8.0)

## 2018-05-22 LAB — COMPREHENSIVE METABOLIC PANEL
ALBUMIN: 3.8 g/dL (ref 3.5–5.0)
ALT: 20 U/L (ref 0–44)
AST: 19 U/L (ref 15–41)
Alkaline Phosphatase: 97 U/L (ref 38–126)
Anion gap: 6 (ref 5–15)
BILIRUBIN TOTAL: 0.7 mg/dL (ref 0.3–1.2)
BUN: 15 mg/dL (ref 6–20)
CHLORIDE: 107 mmol/L (ref 98–111)
CO2: 27 mmol/L (ref 22–32)
Calcium: 9.2 mg/dL (ref 8.9–10.3)
Creatinine, Ser: 0.71 mg/dL (ref 0.44–1.00)
GFR calc Af Amer: >60 mL/min (ref >60)
GFR calc non Af Amer: >60 mL/min (ref >60)
Glucose, Bld: 113 mg/dL — ABNORMAL HIGH (ref 70–99)
POTASSIUM: 4.1 mmol/L (ref 3.5–5.1)
SODIUM: 140 mmol/L (ref 135–145)
TOTAL PROTEIN: 7.7 g/dL (ref 6.5–8.1)

## 2018-05-22 LAB — CBC
HCT: 39.7 % (ref 36.0–46.0)
HEMOGLOBIN: 11.7 g/dL — AB (ref 12.0–15.0)
MCH: 23.8 pg — ABNORMAL LOW (ref 26.0–34.0)
MCHC: 29.5 g/dL — AB (ref 30.0–36.0)
MCV: 80.9 fL (ref 80.0–100.0)
Platelets: 355 10*3/uL (ref 150–400)
RBC: 4.91 MIL/uL (ref 3.87–5.11)
RDW: 15.5 % (ref 11.5–15.5)
WBC: 9.7 10*3/uL (ref 4.0–10.5)
nRBC: 0 % (ref 0.0–0.2)

## 2018-05-22 LAB — LIPASE, BLOOD: Lipase: 27 U/L (ref 11–51)

## 2018-05-22 LAB — POC OCCULT BLOOD, ED: FECAL OCCULT BLD: NEGATIVE

## 2018-05-22 MED ORDER — IOPAMIDOL (ISOVUE-300) INJECTION 61%
100.0000 mL | Freq: Once | INTRAVENOUS | Status: AC | PRN
  Administered 2018-05-22: 100 mL via INTRAVENOUS

## 2018-05-22 NOTE — Discharge Instructions (Addendum)
Testing today is normal.  Use Tylenol for pain, and heat on the sore area 3 or 4 times a day.  See your doctor as needed for problems.

## 2018-05-22 NOTE — ED Provider Notes (Signed)
[facility] EMERGENCY DEPARTMENT Provider Note   CSN: [id number] Arrival date & time: 05/22/18  1613     History   Chief Complaint Chief Complaint  Patient presents with  . Abdominal Pain    HPI Cathy Thomas is a 62 y.o. female.  HPI   She presents for evaluation of right flank pain which started today, waxing and waning, ranging from mild to severe-9/10.  The pain feels different than her typical kidney stone pain.  She also had a sudden urge to defecate today and passed a moderate volume stool, with blood when she wiped.  Did not see blood mixed with stool.  She reports having intermittent constipation.  She denies fever, chills, nausea, vomiting, weakness or dizziness.  She denies dysuria, urinary frequency, hematuria or urgency.  Is been no urinary or fecal incontinence.  There are no other known modifying factors.  Past Medical History:  Diagnosis Date  . Fibromyalgia   . Headache   . Hypertension   . Kidney stone   . Stroke ([place])   . TIA (transient ischemic attack)     Patient Active Problem List   Diagnosis Date Noted  . BV (bacterial vaginosis) 04/24/2015    Past Surgical History:  Procedure Laterality Date  . ABDOMINAL HYSTERECTOMY    . BALLOON DILATION  12/16/2011   Procedure: BALLOON DILATION;  Surgeon: Marissa Nestle, MD;  Location: [facility];  Service: Urology;  Laterality: Left;  . CESAREAN SECTION    . COLONOSCOPY N/A 01/18/2018   Procedure: COLONOSCOPY;  Surgeon: Daneil Dolin, MD;  Location: [facility];  Service: Endoscopy;  Laterality: N/A;  10:15  . KNEE ARTHROSCOPY    . STONE EXTRACTION WITH BASKET  12/16/2011   Procedure: STONE EXTRACTION WITH BASKET;  Surgeon: Marissa Nestle, MD;  Location: [facility];  Service: Urology;  Laterality: Left;     OB History   None      Home Medications    Prior to Admission medications   Medication Sig Start Date End Date Taking? Authorizing Provider  aspirin EC 81 MG tablet Take 81 mg by mouth  daily.   Yes [provider]  losartan (COZAAR) 50 MG tablet Take 50 mg by mouth daily.   Yes [provider]  LYRICA 75 MG capsule Take 75-150 mg by mouth 2 (two) times daily. Takes 2 capsules in the morning and 1 capsule at bedtime. 05/31/14  Yes [provider]  magnesium gluconate (MAGONATE) 500 MG tablet Take 500 mg by mouth daily.    Yes [provider]  MELATONIN PO Take 1 tablet by mouth at bedtime as needed (sleep).   Yes [provider]  Vitamin D, Ergocalciferol, (DRISDOL) 50000 units CAPS capsule Take 50,000 Units by mouth every 7 (seven) days. Takes on Fridays   Yes [provider]  furosemide (LASIX) 20 MG tablet Take 1 tablet (20 mg total) by mouth daily. Patient not taking: Reported on 04/22/2018 01/07/18   Julianne Rice, MD  potassium chloride (K-DUR) 10 MEQ tablet Take 1 tablet (10 mEq total) by mouth daily. Patient not taking: Reported on 05/22/2018 01/07/18   Julianne Rice, MD    Family History Family History  Problem Relation Age of Onset  . Cancer Mother        ovarian  . Hypertension Mother   . Heart disease Mother   . Diabetes Mother   . Cancer Father        lung, prostate  . Hypertension  Father   . Colon cancer Neg Hx     Social History Social History   Tobacco Use  . Smoking status: Never Smoker  . Smokeless tobacco: Never Used  Substance Use Topics  . Alcohol use: No  . Drug use: No     Allergies   Amoxicillin and Fluconazole   Review of Systems Review of Systems  All other systems reviewed and are negative.    Physical Exam Updated Vital Signs BP (!) 164/85   Pulse 80   Temp 97.8 F (36.6 C) (Oral)   Resp 18   Ht 5\' 7"  (1.702 m)   Wt 122.5 kg   SpO2 100%   BMI 42.29 kg/m   Physical Exam  Constitutional: She is oriented to person, place, and time. She appears well-developed and well-nourished. She does not appear ill.  HENT:  Head: Normocephalic and atraumatic.  Eyes:  Pupils are equal, round, and reactive to light. Conjunctivae and EOM are normal.  Neck: Normal range of motion and phonation normal. Neck supple.  Cardiovascular: Normal rate and regular rhythm.  Pulmonary/Chest: Effort normal and breath sounds normal. She exhibits no tenderness.  Abdominal: Soft. She exhibits no distension. There is no tenderness. There is no guarding.  Genitourinary:  Genitourinary Comments: No costovertebral angle tenderness.  Normal anus without fissure or hemorrhoid.  Small amount of brown stool in the rectal vault.  Hemoccult testing negative at the bedside.  Musculoskeletal: Normal range of motion.  Neurological: She is alert and oriented to person, place, and time. She exhibits normal muscle tone.  Skin: Skin is warm and dry.  Psychiatric: She has a normal mood and affect. Her behavior is normal. Judgment and thought content normal.  Nursing note and vitals reviewed.    ED Treatments / Results  Labs (all labs ordered are listed, but only abnormal results are displayed) Labs Reviewed  COMPREHENSIVE METABOLIC PANEL - Abnormal; Notable for the following components:      Result Value   Glucose, Bld 113 (*)    All other components within normal limits  CBC - Abnormal; Notable for the following components:   Hemoglobin 11.7 (*)    MCH 23.8 (*)    MCHC 29.5 (*)    All other components within normal limits  URINALYSIS, ROUTINE W REFLEX MICROSCOPIC - Abnormal; Notable for the following components:   APPearance HAZY (*)    All other components within normal limits  LIPASE, BLOOD  POC OCCULT BLOOD, ED    EKG None  Radiology Ct Abdomen Pelvis W Contrast  Result Date: 05/22/2018 CLINICAL DATA:  Right flank pain, rectal bleeding EXAM: CT ABDOMEN AND PELVIS WITH CONTRAST TECHNIQUE: Multidetector CT imaging of the abdomen and pelvis was performed using the standard protocol following bolus administration of intravenous contrast. CONTRAST:  13mL ISOVUE-300 IOPAMIDOL  (ISOVUE-300) INJECTION 61% COMPARISON:  09/05/2016 FINDINGS: Lower chest: Lung bases are clear. No effusions. Heart is normal size. Small hiatal hernia. Hepatobiliary: Stable 2.4 cm gallstone within the gallbladder. No focal hepatic abnormality or biliary ductal dilatation. Pancreas: No focal abnormality or ductal dilatation. Spleen: Numerous low-density lesions within the spleen are stable since prior study and dating back to 2011 compatible with benign process. Adrenals/Urinary Tract: No adrenal abnormality. No focal renal abnormality. No stones or hydronephrosis. Urinary bladder is unremarkable. Stomach/Bowel: Normal appendix. Stomach, large and small bowel grossly unremarkable. Vascular/Lymphatic: No evidence of aneurysm or adenopathy. Reproductive: Prior hysterectomy.  No adnexal masses. Other: No free fluid or free air. Musculoskeletal: No acute bony  abnormality. IMPRESSION: Small hiatal hernia. Cholelithiasis, stable since prior study. Normal appendix. No acute findings in the abdomen or pelvis. Electronically Signed   By: Rolm Baptise M.D.   On: 05/22/2018 23:13    Procedures Procedures (including critical care time)  Medications Ordered in ED Medications  iopamidol (ISOVUE-300) 61 % injection 100 mL (100 mLs Intravenous Contrast Given 05/22/18 2219)     Initial Impression / Assessment and Plan / ED Course  I have reviewed the triage vital signs and the nursing notes.  Pertinent labs & imaging results that were available during my care of the patient were reviewed by me and considered in my medical decision making (see chart for details).      Patient Vitals for the past 24 hrs:  BP Temp Temp src Pulse Resp SpO2 Height Weight  05/22/18 2200 (!) 164/85 - - 80 18 100 % - -  05/22/18 2132 (!) 160/75 - - 82 18 100 % - -  05/22/18 1707 - - - - - - 5\' 7"  (1.702 m) 122.5 kg  05/22/18 1706 (!) 163/84 97.8 F (36.6 C) Oral 84 18 99 % - -    12:02 AM Reevaluation with update and  discussion. After initial assessment and treatment, an updated evaluation reveals no change in clinical status.  Findings discussed with patient and all questions were answered. Daleen Bo   Medical Decision Making: Nonspecific flank and abdominal pain without evidence for acute intestinal abnormality, UTI, urolithiasis.  Suspect musculoskeletal versus intestinal cramping as sources for pain.  CRITICAL CARE-no Performed by: Daleen Bo  Nursing Notes Reviewed/ Care Coordinated Applicable Imaging Reviewed Interpretation of Laboratory Data incorporated into ED treatment  The patient appears reasonably screened and/or stabilized for discharge and I doubt any other medical condition or other [facility] requiring further screening, evaluation, or treatment in the ED at this time prior to discharge.  Plan: Home Medications-continue usual medications; Home Treatments-rest, fluids; return here if the recommended treatment, does not improve the symptoms; Recommended follow up-PCP, PRN    Final Clinical Impressions(s) / ED Diagnoses   Final diagnoses:  Right upper quadrant abdominal pain    ED Discharge Orders    None       Daleen Bo, MD 05/23/18 0002

## 2018-05-22 NOTE — ED Triage Notes (Signed)
Patient c/o intermittent right lower abd pain that started 2 weeks ago. Patient thought she had a kidney stone in which she has a hx of and pain felt similar. Patient states had Bm today and noticed bright red blood with wiping. No blood noted since. Patient states after BM, low back pain. Denies any nausea, vomiting, chills, or fevers.

## 2018-07-15 ENCOUNTER — Ambulatory Visit (INDEPENDENT_AMBULATORY_CARE_PROVIDER_SITE_OTHER): Payer: Medicare PPO | Admitting: Orthopedic Surgery

## 2018-07-15 ENCOUNTER — Encounter: Payer: Self-pay | Admitting: Orthopedic Surgery

## 2018-07-15 VITALS — BP 130/83 | HR 67 | Ht 67.0 in | Wt 269.0 lb

## 2018-07-15 DIAGNOSIS — M1712 Unilateral primary osteoarthritis, left knee: Secondary | ICD-10-CM

## 2018-07-15 DIAGNOSIS — M1710 Unilateral primary osteoarthritis, unspecified knee: Secondary | ICD-10-CM

## 2018-07-15 DIAGNOSIS — Z6841 Body Mass Index (BMI) 40.0 and over, adult: Secondary | ICD-10-CM

## 2018-07-15 MED ORDER — DICLOFENAC SODIUM 75 MG PO TBEC: 75.0000 mg | DELAYED_RELEASE_TABLET | Freq: Two times a day (BID) | ORAL | 2 refills | Status: DC

## 2018-07-15 NOTE — Progress Notes (Signed)
Progress Note   Patient ID: Cathy Thomas, female   DOB: 1955-08-19, 62 y.o.   MRN: [id number]   Chief Complaint  Patient presents with  . Knee Pain    Recheck on bilateral knees    HPI The patient presents for evaluation of bilateral knee pain left worse than right.  She is 62 years old she has had bilateral knee arthroscopy worked at [employer] presented in September with acute onset of left knee pain she was treated with intramuscular injection of cortisone comes in complaining of intermittent pain.    Review of Systems  Constitutional: Negative for chills, fever and weight loss.  Respiratory: Negative for shortness of breath.   Cardiovascular: Negative for chest pain.  Neurological: Negative for tingling.   Current Meds  Medication Sig  . aspirin EC 81 MG tablet Take 81 mg by mouth daily.  . furosemide (LASIX) 20 MG tablet Take 1 tablet (20 mg total) by mouth daily.  Marland Kitchen losartan (COZAAR) 50 MG tablet Take 50 mg by mouth daily.  Marland Kitchen LYRICA 75 MG capsule Take 75-150 mg by mouth 2 (two) times daily. Takes 2 capsules in the morning and 1 capsule at bedtime.  . magnesium gluconate (MAGONATE) 500 MG tablet Take 500 mg by mouth daily.   Marland Kitchen MELATONIN PO Take 1 tablet by mouth at bedtime as needed (sleep).  . Vitamin D, Ergocalciferol, (DRISDOL) 50000 units CAPS capsule Take 50,000 Units by mouth every 7 (seven) days. Takes on Fridays    Past Medical History:  Diagnosis Date  . Fibromyalgia   . Headache   . Hypertension   . Kidney stone   . Stroke ([place])   . TIA (transient ischemic attack)      Allergies  Allergen Reactions  . Amoxicillin Itching    Has patient had a PCN reaction causing immediate rash, facial/tongue/throat swelling, SOB or lightheadedness with hypotension: Unknown Has patient had a PCN reaction causing severe rash involving mucus membranes or skin necrosis: Unknown Has patient had a PCN reaction that required hospitalization: Unknown Has patient had a PCN  reaction occurring within the last 10 years: Unknown If all of the above answers are "NO", then may proceed with Cephalosporin use.   Marland Kitchen Fluconazole Rash    burning sensation,      BP 130/83   Pulse 67   Ht 5\' 7"  (1.702 m)   Wt 269 lb (122 kg)   BMI 42.13 kg/m    Physical Exam General appearance normal Oriented x3 normal Mood pleasant affect normal Gait antalgic  Ortho Exam  Left knee patient has a very large leg bilaterally.  Her BMI is over 40 she was counseled on weight loss.  She has tenderness over the medial joint line knee flexion is 90 degrees may be more except the leg size knee feels stable tenderness primarily medial joint line no effusion strength is normal her skin is warm dry and intact without ulceration again peripheral edema no lymphadenopathy sensation is normal  Her right leg is similar in terms of size motion with no strength deficit    MEDICAL DECISION MAKING   Imaging:  Prior x-ray did show symmetric joint space narrowing with physiologic valgus some secondary bone changes.  The patient meets the AMA guidelines for Morbid (severe) obesity with a BMI > 40.0 and I have recommended weight loss.    Encounter Diagnoses  Name Primary?  . Primary localized osteoarthritis of knee Yes  . Body mass index 40.0-44.9, adult ([place])   .  Morbid obesity ([place])      PLAN: (RX., injection, surgery,frx,mri/ct, XR 2 body ares) Procedure note left knee injection verbal consent was obtained to inject left knee joint  Timeout was completed to confirm the site of injection  The medications used were 40 mg of Depo-Medrol and 1% lidocaine 3 cc  Anesthesia was provided by ethyl chloride and the skin was prepped with alcohol.  After cleaning the skin with alcohol a 20-gauge needle was used to inject the left knee joint. There were no complications. A sterile bandage was applied.  We recommended weight loss the injection she wanted a cane which we gave her prescription  for follow-up in 6 months   Meds ordered this encounter  Medications  . diclofenac (VOLTAREN) 75 MG EC tablet    Sig: Take 1 tablet (75 mg total) by mouth 2 (two) times daily with a meal.    Dispense:  60 tablet    Refill:  2   12:37 PM 07/15/2018

## 2018-07-15 NOTE — Patient Instructions (Signed)
Try to lose weight goal wt is 255  Start arthritis medication

## 2018-08-17 ENCOUNTER — Other Ambulatory Visit (HOSPITAL_COMMUNITY): Payer: Self-pay | Admitting: Nurse Practitioner

## 2018-08-17 DIAGNOSIS — Z1231 Encounter for screening mammogram for malignant neoplasm of breast: Secondary | ICD-10-CM

## 2018-08-29 ENCOUNTER — Ambulatory Visit (HOSPITAL_COMMUNITY)
Admission: RE | Admit: 2018-08-29 | Discharge: 2018-08-29 | Disposition: A | Discharge status: Home / Self Care | Payer: Medicare PPO | Source: Ambulatory Visit | Attending: Nurse Practitioner | Admitting: Nurse Practitioner

## 2018-08-29 DIAGNOSIS — Z1231 Encounter for screening mammogram for malignant neoplasm of breast: Secondary | ICD-10-CM | POA: Diagnosis not present

## 2018-12-02 ENCOUNTER — Telehealth (INDEPENDENT_AMBULATORY_CARE_PROVIDER_SITE_OTHER): Payer: Medicare PPO | Admitting: Cardiovascular Disease

## 2018-12-02 ENCOUNTER — Encounter: Payer: Self-pay | Admitting: Cardiovascular Disease

## 2018-12-02 VITALS — Ht 67.0 in | Wt 270.0 lb

## 2018-12-02 DIAGNOSIS — Z8673 Personal history of transient ischemic attack (TIA), and cerebral infarction without residual deficits: Secondary | ICD-10-CM

## 2018-12-02 DIAGNOSIS — I10 Essential (primary) hypertension: Secondary | ICD-10-CM

## 2018-12-02 DIAGNOSIS — I739 Peripheral vascular disease, unspecified: Secondary | ICD-10-CM | POA: Diagnosis not present

## 2018-12-02 MED ORDER — ATORVASTATIN CALCIUM 20 MG PO TABS: 20.0000 mg | ORAL_TABLET | Freq: Every day | ORAL | 3 refills | Status: DC

## 2018-12-02 NOTE — Progress Notes (Signed)
Medication Instructions:  Start Lipitor 20 mg once daily   Labwork: I will request labs form pcp   Testing/Procedures: none  Follow-Up: Your physician recommends that you schedule a follow-up appointment in: to be determined    Any Other Special Instructions Will Be Listed Below (If Applicable).  You have been referred to Dr. Gwenlyn Found for possible PAD    If you need a refill on your cardiac medications before your next appointment, please call your pharmacy.

## 2018-12-02 NOTE — Progress Notes (Signed)
Virtual Visit via Telephone Note   This visit type was conducted due to national recommendations for restrictions regarding the COVID-19 Pandemic (e.g. social distancing) in an effort to limit this patient's exposure and mitigate transmission in our community.  Due to her co-morbid illnesses, this patient is at least at moderate risk for complications without adequate follow up.  This format is felt to be most appropriate for this patient at this time.  The patient did not have access to video technology/had technical difficulties with video requiring transitioning to audio format only (telephone).  All issues noted in this document were discussed and addressed.  No physical exam could be performed with this format.  Please refer to the patient's chart for her  consent to telehealth for [facility].   Date:  12/02/2018   ID:  Cathy Thomas, DOB 1956-06-06, MRN [id number]  Patient Location: Home Provider Location: Home  PCP:  Renee Rival, NP  Cardiologist:  No primary care provider on file.  Electrophysiologist:  None   Evaluation Performed:  New Patient Evaluation  Chief Complaint:  PAD  History of Present Illness:    Cathy Thomas is a 63 y.o. female with a h/o HTN and CVA.  She saw a cardiologist in [place] 2 yrs ago and was told she has PVD. Told to wear support hose. Told she needs peripheral stents. Unable to go to gym due to pandemic. Says her legs hurt all the time. Tries walking in her church 2-3 times/week which helps. Says right calf hurts when she walks the most. Left ankle swollen since CVA roughly 8 yrs ago. Leg pain began 2 yrs ago. No h/o smoking. Mother had h/o ankle swelling b/l. Very seldom has chest wall pain which she attributes to fibromyalgia. She has occasional chest tenderness but denies exertional pain and dyspnea. Never been on statin therapy. Was always told her heart is fine.  The patient does not have symptoms concerning for COVID-19  infection (fever, chills, cough, or new shortness of breath).    Past Medical History:  Diagnosis Date  . Fibromyalgia   . Headache   . Hypertension   . Kidney stone   . Stroke ([place])   . TIA (transient ischemic attack)    Past Surgical History:  Procedure Laterality Date  . ABDOMINAL HYSTERECTOMY    . BALLOON DILATION  12/16/2011   Procedure: BALLOON DILATION;  Surgeon: Marissa Nestle, MD;  Location: [facility] OR[facility];  Service: Urology;  Laterality: Left;  . CESAREAN SECTION    . COLONOSCOPY N/A 01/18/2018   Procedure: COLONOSCOPY;  Surgeon: Daneil Dolin, MD;  Location: [facility] END[facility] SUITE;  Service: Endoscopy;  Laterality: N/A;  10:15  . KNEE ARTHROSCOPY    . STONE EXTRACTION WITH BASKET  12/16/2011   Procedure: STONE EXTRACTION WITH BASKET;  Surgeon: Marissa Nestle, MD;  Location: [facility] ORS;  Service: Urology;  Laterality: Left;     Current Meds  Medication Sig  . aspirin EC 81 MG tablet Take 81 mg by mouth daily.  Marland Kitchen losartan (COZAAR) 50 MG tablet Take 50 mg by mouth daily.  Marland Kitchen LYRICA 75 MG capsule Take 75-150 mg by mouth 2 (two) times daily. Takes 2 capsules in the morning and 1 capsule at bedtime.  . magnesium gluconate (MAGONATE) 500 MG tablet Take 500 mg by mouth daily.   . Vitamin D, Ergocalciferol, (DRISDOL) 50000 units CAPS capsule Take 50,000 Units by mouth every 7 (seven) days. Takes on Fridays  . [DISCONTINUED]  MELATONIN PO Take 1 tablet by mouth at bedtime as needed (sleep).     Allergies:   Amoxicillin and Fluconazole   Social History   Tobacco Use  . Smoking status: Never Smoker  . Smokeless tobacco: Never Used  Substance Use Topics  . Alcohol use: No  . Drug use: No     Family Hx: The patient's family history includes Cancer in her father and mother; Diabetes in her mother; Heart disease in her mother; Hypertension in her father and mother. There is no history of Colon cancer.  ROS:   Please see the history of present illness.     All other systems reviewed and  are negative.   Prior CV studies:   The following studies were reviewed today:  NA  Labs/Other Tests and Data Reviewed:    EKG:  An ECG dated 01/07/18 was personally reviewed today and demonstrated:  Sinus rhythm.  Recent Labs: 01/07/2018: B Natriuretic Peptide 66.0 05/22/2018: ALT 20; BUN 15; Creatinine, Ser 0.71; Hemoglobin 11.7; Platelets 355; Potassium 4.1; Sodium 140   Recent Lipid Panel No results found for: CHOL, TRIG, HDL, CHOLHDL, LDLCALC, LDLDIRECT  Wt Readings from Last 3 Encounters:  12/02/18 270 lb (122.5 kg)  07/15/18 269 lb (122 kg)  05/22/18 270 lb (122.5 kg)     Objective:    Vital Signs:  Ht 5\' 7"  (1.702 m)   Wt 270 lb (122.5 kg)   BMI 42.29 kg/m    VITAL SIGNS:  reviewed  ASSESSMENT & PLAN:    1. PAD with claudication: She will need ABI's at a minimum as her last studies were at least 2 yrs ago. I will arrange for this. I will then refer her to Dr. Quay Burow, one of my colleagues who specializes in PAD and can intervene percutaneously if need be. She is on ASA. She has never been on statin therapy. I will initially start atorvastatin 20 mg daily (also has h/o CVA) but wouldn't hesitate to increase dose to at least 40 mg if she tolerates lower dose. I will also obtain a copy of lipids from PCP.  2. HTN: No changes to therapy.  3. History of CVA: On ASA. She has never been on statin therapy. I will initially start atorvastatin 20 mg daily but wouldn't hesitate to increase dose to at least 40 mg if she tolerates lower dose.   COVID-19 Education: The signs and symptoms of COVID-19 were discussed with the patient and how to seek care for testing (follow up with PCP or arrange E-visit).  The importance of social distancing was discussed today.  Time:   Today, I have spent 30 minutes with the patient with telehealth technology discussing the above problems.     Medication Adjustments/Labs and Tests Ordered: Current medicines are reviewed at length with  the patient today.  Concerns regarding medicines are outlined above.   Tests Ordered: No orders of the defined types were placed in this encounter.   Medication Changes: No orders of the defined types were placed in this encounter.   Disposition:  Follow up with me to be determined. She will see Dr. Quay Burow for PAD.  Signed, Kate Sable, MD  12/02/2018 9:23 AM    [place]

## 2018-12-02 NOTE — Addendum Note (Signed)
Addended byDebbora Lacrosse R on: 12/02/2018 11:05 AM   Modules accepted: Orders

## 2018-12-07 ENCOUNTER — Other Ambulatory Visit: Payer: Self-pay | Admitting: Cardiovascular Disease

## 2018-12-07 DIAGNOSIS — I739 Peripheral vascular disease, unspecified: Secondary | ICD-10-CM

## 2018-12-14 ENCOUNTER — Ambulatory Visit (INDEPENDENT_AMBULATORY_CARE_PROVIDER_SITE_OTHER): Payer: Medicare PPO

## 2018-12-14 DIAGNOSIS — I739 Peripheral vascular disease, unspecified: Secondary | ICD-10-CM

## 2018-12-15 ENCOUNTER — Telehealth: Payer: Self-pay | Admitting: *Deleted

## 2018-12-15 NOTE — Telephone Encounter (Signed)
Notes recorded by Laurine Blazer, LPN on 5/88/3254 at 9:82 PM EDT Patient notified. Copy to pmd. Informed patient that her pmd can manage her Lipitor moving forward. She verbalized understanding.  ----- Herminio Commons, MD sent to Laurine Blazer, LPN        prn    ------  Notes recorded by Laurine Blazer, LPN on 6/41/5830 at 9:40 PM EDT When do you want her back for follow up? ------  Notes recorded by Herminio Commons, MD on 12/14/2018 at 2:28 PM EDT No blockages.

## 2019-01-05 ENCOUNTER — Telehealth: Payer: Self-pay | Admitting: Cardiovascular Disease

## 2019-01-05 NOTE — Telephone Encounter (Signed)
Called patient who was confused as to why she needed to see another doctor.  She was told the test was fine and there were no blood clots.  Message sent to nurse at referring office.

## 2019-01-19 ENCOUNTER — Telehealth: Payer: Medicare PPO | Admitting: Cardiovascular Disease

## 2019-01-19 ENCOUNTER — Telehealth: Payer: Self-pay

## 2019-01-19 ENCOUNTER — Telehealth: Payer: Self-pay | Admitting: *Deleted

## 2019-01-19 NOTE — Telephone Encounter (Signed)
Spoke with pt and informed her that Dr. Gwenlyn Found prefers to see his new PV cases as OV on DOD days. Next DOD is 7/22. Pt agreeable to changing appt date to 7/22 and prefers to be seen in the office. Appt set for 7/22 at 2:45 pm

## 2019-01-19 NOTE — Telephone Encounter (Signed)
    COVID-19 Pre-Screening Questions:  . In the past 7 to 10 days have you had a cough,  shortness of breath, headache, congestion, fever (100 or greater) body aches, chills, sore throat, or sudden loss of taste or sense of smell? NO . Have you been around anyone with known Covid 19. NO . Have you been around anyone who is awaiting Covid 19 test results in the past 7 to 10 days? NO . Have you been around anyone who has been exposed to Covid 19, or has mentioned symptoms of Covid 19 within the past 7 to 10 days? NO  If you have any concerns/questions about symptoms patients report during screening (either on the phone or at threshold). Contact the provider seeing the patient or DOD for further guidance.  If neither are available contact a member of the leadership team.   Let patient know that due to covid 19 and social distancing we are not allowing visitors to come to appointments with patients.

## 2019-02-22 ENCOUNTER — Ambulatory Visit (INDEPENDENT_AMBULATORY_CARE_PROVIDER_SITE_OTHER): Payer: Medicare PPO | Admitting: Cardiovascular Disease

## 2019-02-22 ENCOUNTER — Encounter: Payer: Self-pay | Admitting: Cardiovascular Disease

## 2019-02-22 ENCOUNTER — Other Ambulatory Visit: Payer: Self-pay

## 2019-02-22 DIAGNOSIS — I10 Essential (primary) hypertension: Secondary | ICD-10-CM | POA: Insufficient documentation

## 2019-02-22 DIAGNOSIS — M79604 Pain in right leg: Secondary | ICD-10-CM | POA: Diagnosis not present

## 2019-02-22 DIAGNOSIS — E782 Mixed hyperlipidemia: Secondary | ICD-10-CM | POA: Diagnosis not present

## 2019-02-22 DIAGNOSIS — M79605 Pain in left leg: Secondary | ICD-10-CM

## 2019-02-22 DIAGNOSIS — E785 Hyperlipidemia, unspecified: Secondary | ICD-10-CM | POA: Insufficient documentation

## 2019-02-22 NOTE — Progress Notes (Signed)
02/22/2019 Cathy Thomas   [birth year]  [id number]  Primary Physician Renee Rival, NP Primary Cardiologist: Lorretta Harp MD [name], [place]  HPI:  Cathy Thomas is a 63 y.o. 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 Bronson Ing for peripheral vascular evaluation.  She has a history of treated hypertension hyperlipidemia.  She had a stroke 5 years ago without neurologic deficits.  She is never had a heart attack.  She denies chest pain or shortness of breath.  She gets nocturnal leg pain most consistent with restless leg syndrome.  She had recent segmental pressures performed on 12/14/2018 which were entirely normal.   Current Meds  Medication Sig  . aspirin EC 81 MG tablet Take 81 mg by mouth daily.  Marland Kitchen atorvastatin (LIPITOR) 20 MG tablet Take 1 tablet (20 mg total) by mouth daily.  Marland Kitchen losartan (COZAAR) 50 MG tablet Take 50 mg by mouth daily.  Marland Kitchen LYRICA 75 MG capsule Take 75-150 mg by mouth 2 (two) times daily. Takes 2 capsules in the morning and 1 capsule at bedtime.  . magnesium gluconate (MAGONATE) 500 MG tablet Take 500 mg by mouth daily.   . Vitamin D, Ergocalciferol, (DRISDOL) 50000 units CAPS capsule Take 50,000 Units by mouth every 7 (seven) days. Takes on Fridays     Allergies  Allergen Reactions  . Amoxicillin Itching    Has patient had a PCN reaction causing immediate rash, facial/tongue/throat swelling, SOB or lightheadedness with hypotension: Unknown Has patient had a PCN reaction causing severe rash involving mucus membranes or skin necrosis: Unknown Has patient had a PCN reaction that required hospitalization: Unknown Has patient had a PCN reaction occurring within the last 10 years: Unknown If all of the above answers are "NO", then may proceed with Cephalosporin use.   Marland Kitchen Fluconazole Rash    burning sensation,     Social History   Socioeconomic History  . Marital  status: Divorced    Spouse name: Not on file  . Number of children: Not on file  . Years of education: Not on file  . Highest education level: Not on file  Occupational History  . Not on file  Social Needs  . Financial resource strain: Not on file  . Food insecurity    Worry: Not on file    Inability: Not on file  . Transportation needs    Medical: Not on file    Non-medical: Not on file  Tobacco Use  . Smoking status: Never Smoker  . Smokeless tobacco: Never Used  Substance and Sexual Activity  . Alcohol use: No  . Drug use: No  . Sexual activity: Not Currently  Lifestyle  . Physical activity    Days per week: Not on file    Minutes per session: Not on file  . Stress: Not on file  Relationships  . Social Herbalist on phone: Not on file    Gets together: Not on file    Attends religious service: Not on file    Active member of club or organization: Not on file    Attends meetings of clubs or organizations: Not on file    Relationship status: Not on file  . Intimate partner violence    Fear of current or ex partner: Not on file    Emotionally abused: Not on file    Physically abused: Not on file  Forced sexual activity: Not on file  Other Topics Concern  . Not on file  Social History Narrative  . Not on file     Review of Systems: General: negative for chills, fever, night sweats or weight changes.  Cardiovascular: negative for chest pain, dyspnea on exertion, edema, orthopnea, palpitations, paroxysmal nocturnal dyspnea or shortness of breath Dermatological: negative for rash Respiratory: negative for cough or wheezing Urologic: negative for hematuria Abdominal: negative for nausea, vomiting, diarrhea, bright red blood per rectum, melena, or hematemesis Neurologic: negative for visual changes, syncope, or dizziness All other systems reviewed and are otherwise negative except as noted above.    Blood pressure 126/70, pulse 77, temperature 97.9 F  (36.6 C), height 5\' 8"  (1.727 m), weight 270 lb (122.5 kg).  General appearance: alert and no distress Neck: no adenopathy, no carotid bruit, no JVD, supple, symmetrical, trachea midline and thyroid not enlarged, symmetric, no tenderness/mass/nodules Lungs: clear to auscultation bilaterally Heart: regular rate and rhythm, S1, S2 normal, no murmur, click, rub or gallop Extremities: extremities normal, atraumatic, no cyanosis or edema Pulses: 2+ and symmetric Skin: Skin color, texture, turgor normal. No rashes or lesions Neurologic: Alert and oriented X 3, normal strength and tone. Normal symmetric reflexes. Normal coordination and gait  EKG sinus rhythm at 77 without ST or T wave changes.  I personally reviewed this EKG.  ASSESSMENT AND PLAN:   Bilateral leg pain [name] was referred to me by Dr. Bronson Ing for peripheral vascular evaluation.  Her symptoms are more compatible with restless leg syndrome.  Recent Dopplers performed 12/14/2018 revealed normal ABIs bilaterally triphasic waveforms.  No further PV work-up necessary at this time.      Lorretta Harp MD FACP,FACC,FAHA, [facility] 02/22/2019 3:16 PM

## 2019-02-22 NOTE — Assessment & Plan Note (Signed)
Cathy Thomas was referred to me by Dr. Bronson Ing for peripheral vascular evaluation.  Her symptoms are more compatible with restless leg syndrome.  Recent Dopplers performed 12/14/2018 revealed normal ABIs bilaterally triphasic waveforms.  No further PV work-up necessary at this time.

## 2019-02-22 NOTE — Patient Instructions (Addendum)
Medication Instructions:  Your physician recommends that you continue on your current medications as directed. Please refer to the Current Medication list given to you today.  If you need a refill on your cardiac medications before your next appointment, please call your pharmacy.   Lab work: NONE If you have labs (blood work) drawn today and your tests are completely normal, you will receive your results only by: Marland Kitchen MyChart Message (if you have MyChart) OR . A paper copy in the mail If you have any lab test that is abnormal or we need to change your treatment, we will call you to review the results.  Testing/Procedures: NONE  Follow-Up: At [facility], you and your health needs are our priority.  As part of our continuing mission to provide you with exceptional heart care, we have created designated Provider Care Teams.  These Care Teams include your primary Cardiologist (physician) and Advanced Practice Providers (APPs -  Physician Assistants and Nurse Practitioners) who all work together to provide you with the care you need, when you need it. . You may schedule a follow up appointment AS NEEDED. You may see Dr. Gwenlyn Found or one of the following Advanced Practice Providers on your designated Care Team:   . Kerin Ransom, [place] . Almyra Deforest, PA-C . Fabian Sharp, PA-C . Jory Sims, DNP . Rosaria Ferries, PA-C . Roby Lofts, PA-C

## 2019-08-07 ENCOUNTER — Other Ambulatory Visit: Payer: Self-pay

## 2019-08-07 ENCOUNTER — Encounter (HOSPITAL_COMMUNITY): Payer: Self-pay | Admitting: Emergency Medicine

## 2019-08-07 ENCOUNTER — Emergency Department (HOSPITAL_COMMUNITY)
Admission: EM | Admit: 2019-08-07 | Discharge: 2019-08-08 | Disposition: A | Discharge status: Home / Self Care | Payer: Medicare PPO | Attending: Emergency Medicine | Admitting: Emergency Medicine

## 2019-08-07 DIAGNOSIS — Y929 Unspecified place or not applicable: Secondary | ICD-10-CM | POA: Diagnosis not present

## 2019-08-07 DIAGNOSIS — Y33XXXA Other specified events, undetermined intent, initial encounter: Secondary | ICD-10-CM | POA: Diagnosis not present

## 2019-08-07 DIAGNOSIS — Y999 Unspecified external cause status: Secondary | ICD-10-CM | POA: Diagnosis not present

## 2019-08-07 DIAGNOSIS — Z8673 Personal history of transient ischemic attack (TIA), and cerebral infarction without residual deficits: Secondary | ICD-10-CM | POA: Diagnosis not present

## 2019-08-07 DIAGNOSIS — I10 Essential (primary) hypertension: Secondary | ICD-10-CM | POA: Insufficient documentation

## 2019-08-07 DIAGNOSIS — Y939 Activity, unspecified: Secondary | ICD-10-CM | POA: Insufficient documentation

## 2019-08-07 DIAGNOSIS — S161XXA Strain of muscle, fascia and tendon at neck level, initial encounter: Secondary | ICD-10-CM | POA: Insufficient documentation

## 2019-08-07 DIAGNOSIS — Z79899 Other long term (current) drug therapy: Secondary | ICD-10-CM | POA: Diagnosis not present

## 2019-08-07 DIAGNOSIS — Z7982 Long term (current) use of aspirin: Secondary | ICD-10-CM | POA: Insufficient documentation

## 2019-08-07 NOTE — ED Triage Notes (Signed)
BP in the 200's sys today.  Took bp meds x 30 min ago

## 2019-08-08 MED ORDER — METHOCARBAMOL 500 MG PO TABS
500.0000 mg | ORAL_TABLET | Freq: Once | ORAL | Status: AC
  Administered 2019-08-08: 500 mg via ORAL
  Filled 2019-08-08: qty 1

## 2019-08-08 MED ORDER — METHOCARBAMOL 500 MG PO TABS: 500.0000 mg | ORAL_TABLET | Freq: Three times a day (TID) | ORAL | 0 refills | Status: DC

## 2019-08-08 NOTE — Discharge Instructions (Addendum)
Be sure to follow-up with your doctor tomorrow for recheck.  Continue taking your blood pressure medication daily.  Return to ER for any worsening symptoms

## 2019-08-08 NOTE — ED Provider Notes (Signed)
[facility] EMERGENCY DEPARTMENT Provider Note   CSN: [id number] Arrival date & time: 08/07/19  [id number]     History Chief Complaint  Patient presents with  . Hypertension    Cathy Thomas is a 64 y.o. female.  HPI      Cathy Thomas is a 64 y.o. female who presents to the Emergency Department complaining of elevated blood pressure.  She reports taking her blood pressure earlier this afternoon after work and had a systolic of greater than [id number].  She takes losartan 50 mg daily and states that her blood pressure is typically well controlled.  She also notes that she has been having pain across her shoulders and into her neck. Pain has been present for 2 weeks and was present before she began having issues with her blood pressure.  Pain is associated with turning her head to the right and worsens with driving.  She denies headaches, dizziness, visual changes, chest pain, shortness of breath, numbness or weakness of the extremities.  She took one of her blood pressure pills just prior to ER arrival.  She has been using a wrist cuff to monitor her pressures.    Past Medical History:  Diagnosis Date  . Fibromyalgia   . Headache   . Hypertension   . Kidney stone   . Stroke ([place])   . TIA (transient ischemic attack)     Patient Active Problem List   Diagnosis Date Noted  . Essential hypertension 02/22/2019  . Hyperlipidemia 02/22/2019  . Bilateral leg pain 02/22/2019  . BV (bacterial vaginosis) 04/24/2015    Past Surgical History:  Procedure Laterality Date  . ABDOMINAL HYSTERECTOMY    . BALLOON DILATION  12/16/2011   Procedure: BALLOON DILATION;  Surgeon: Marissa Nestle, MD;  Location: [facility];  Service: Urology;  Laterality: Left;  . CESAREAN SECTION    . COLONOSCOPY N/A 01/18/2018   Procedure: COLONOSCOPY;  Surgeon: Daneil Dolin, MD;  Location: [facility] SU[facility];  Service: Endoscopy;  Laterality: N/A;  10:15  . KNEE ARTHROSCOPY    . STONE EXTRACTION WITH BASKET  12/16/2011     Procedure: STONE EXTRACTION WITH BASKET;  Surgeon: Marissa Nestle, MD;  Location: [facility];  Service: Urology;  Laterality: Left;     OB History   No obstetric history on file.     Family History  Problem Relation Age of Onset  . Cancer Mother        ovarian  . Hypertension Mother   . Heart disease Mother   . Diabetes Mother   . Cancer Father        lung, prostate  . Hypertension Father   . Colon cancer Neg Hx     Social History   Tobacco Use  . Smoking status: Never Smoker  . Smokeless tobacco: Never Used  Substance Use Topics  . Alcohol use: No  . Drug use: No    Home Medications Prior to Admission medications   Medication Sig Start Date End Date Taking? Authorizing Provider  aspirin EC 81 MG tablet Take 81 mg by mouth daily.    [provider]  atorvastatin (LIPITOR) 20 MG tablet Take 1 tablet (20 mg total) by mouth daily. 12/02/18 03/02/19  Herminio Commons, MD  losartan (COZAAR) 50 MG tablet Take 50 mg by mouth daily.    [provider]  LYRICA 75 MG capsule Take 75-150 mg by mouth 2 (two) times daily. Takes 2 capsules in the morning and 1  capsule at bedtime. 05/31/14   [provider]  magnesium gluconate (MAGONATE) 500 MG tablet Take 500 mg by mouth daily.     [provider]  methocarbamol (ROBAXIN) 500 MG tablet Take 1 tablet (500 mg total) by mouth 3 (three) times daily. 08/08/19   Deolinda Frid, PA-C  Vitamin D, Ergocalciferol, (DRISDOL) 50000 units CAPS capsule Take 50,000 Units by mouth every 7 (seven) days. Takes on Fridays    [provider]    Allergies    Amoxicillin and Fluconazole  Review of Systems   Review of Systems  Constitutional: Negative for activity change, appetite change, diaphoresis and fever.  HENT: Negative for trouble swallowing.   Eyes: Negative for photophobia, pain and visual disturbance.  Respiratory: Negative for cough and shortness of breath.   Cardiovascular: Negative for  chest pain and leg swelling.  Gastrointestinal: Negative for abdominal pain, nausea and vomiting.  Musculoskeletal: Positive for neck pain. Negative for neck stiffness.  Skin: Negative for rash and wound.  Neurological: Negative for dizziness, facial asymmetry, speech difficulty, weakness, numbness and headaches.  Psychiatric/Behavioral: Negative for confusion and decreased concentration.    Physical Exam Updated Vital Signs BP (!) 157/77   Pulse 89   Temp 98 F (36.7 C) (Oral)   Resp 16   Ht 5\' 8"  (1.727 m)   Wt 122 kg   SpO2 99%   BMI 40.90 kg/m   Physical Exam Vitals and nursing note reviewed.  Constitutional:      General: She is not in acute distress.    Appearance: Normal appearance. She is not ill-appearing or toxic-appearing.  HENT:     Head: Atraumatic.     Mouth/Throat:     Mouth: Mucous membranes are moist.  Eyes:     Extraocular Movements: Extraocular movements intact.     Conjunctiva/sclera: Conjunctivae normal.     Pupils: Pupils are equal, round, and reactive to light.  Neck:     Trachea: Trachea and phonation normal.     Comments: ttp of the bilateral trapezius muscles.  Grip strength is strong and symmetrical Cardiovascular:     Rate and Rhythm: Normal rate and regular rhythm.     Pulses: Normal pulses.  Pulmonary:     Effort: Pulmonary effort is normal.     Breath sounds: Normal breath sounds.  Chest:     Chest wall: No tenderness.  Musculoskeletal:        General: Normal range of motion.     Cervical back: Normal range of motion and neck supple. Tenderness present. Muscular tenderness present.  Skin:    General: Skin is warm.     Capillary Refill: Capillary refill takes less than 2 seconds.     Findings: No erythema or rash.  Neurological:     General: No focal deficit present.     Mental Status: She is alert.     GCS: GCS eye subscore is 4. GCS verbal subscore is 5. GCS motor subscore is 6.     Sensory: Sensation is intact.     Motor: Motor  function is intact.     Coordination: Coordination is intact. Coordination normal.     Comments: CN II-XII intact.  Speech clear, no pronator drift.  nml finger-nose testing.  Gait is steady, no ataxia.       ED Results / Procedures / Treatments   Labs (all labs ordered are listed, but only abnormal results are displayed) Labs Reviewed - No data to display  EKG None  Radiology No results found.  Procedures Procedures (including critical care time)  Medications Ordered in ED Medications  methocarbamol (ROBAXIN) tablet 500 mg (500 mg Oral Given 08/08/19 0016)    ED Course  I have reviewed the triage vital signs and the nursing notes.  Pertinent labs & imaging results that were available during my care of the patient were reviewed by me and considered in my medical decision making (see chart for details).    MDM Rules/Calculators/A&P                      Pt with hx of HTN currently taking losartan 50 mg.  She is well appearing.  No focal neuro deficits.  Neck pain is reproducible with palpation and movement and likely musculoskeletal in origin.  Will provide rx for muscle relaxer.  Her BP's here have been reassuring.  I have advised her to avoid using wrist cuffs to monitor her BP, she will continue her prescribed medication dose and she has an appt with her PCP tomorrow.  Return precaution discussed.     Final Clinical Impression(s) / ED Diagnoses Final diagnoses:  Hypertension, unspecified type  Strain of neck muscle, initial encounter    Rx / DC Orders ED Discharge Orders         Ordered    methocarbamol (ROBAXIN) 500 MG tablet  3 times daily     08/08/19 0008           Kem Parkinson, PA-C 08/08/19 0052    Ripley Fraise, MD 08/08/19 [phone]

## 2019-09-04 ENCOUNTER — Other Ambulatory Visit (HOSPITAL_COMMUNITY): Payer: Self-pay | Admitting: Nurse Practitioner

## 2019-09-04 DIAGNOSIS — Z1231 Encounter for screening mammogram for malignant neoplasm of breast: Secondary | ICD-10-CM

## 2019-09-13 ENCOUNTER — Encounter: Payer: Self-pay | Admitting: Orthopedic Surgery

## 2019-09-13 ENCOUNTER — Other Ambulatory Visit: Payer: Self-pay

## 2019-09-13 ENCOUNTER — Ambulatory Visit: Payer: Medicare PPO

## 2019-09-13 ENCOUNTER — Ambulatory Visit: Payer: Medicare PPO | Admitting: Orthopedic Surgery

## 2019-09-13 VITALS — BP 159/109 | HR 88 | Temp 98.1°F | Ht 67.0 in | Wt 278.5 lb

## 2019-09-13 DIAGNOSIS — M1710 Unilateral primary osteoarthritis, unspecified knee: Secondary | ICD-10-CM

## 2019-09-13 DIAGNOSIS — M1712 Unilateral primary osteoarthritis, left knee: Secondary | ICD-10-CM

## 2019-09-13 DIAGNOSIS — M170 Bilateral primary osteoarthritis of knee: Secondary | ICD-10-CM

## 2019-09-13 DIAGNOSIS — Z6841 Body Mass Index (BMI) 40.0 and over, adult: Secondary | ICD-10-CM

## 2019-09-13 NOTE — Progress Notes (Signed)
No chief complaint on file.   BP (!) 159/109   Pulse 88   Temp 98.1 F (36.7 C)   Ht 5\' 7"  (1.702 m)   Wt 278 lb 8 oz (126.3 kg)   BMI 43.62 kg/m   Allergies  Allergen Reactions  . Amoxicillin Itching    Has patient had a PCN reaction causing immediate rash, facial/tongue/throat swelling, SOB or lightheadedness with hypotension: Unknown Has patient had a PCN reaction causing severe rash involving mucus membranes or skin necrosis: Unknown Has patient had a PCN reaction that required hospitalization: Unknown Has patient had a PCN reaction occurring within the last 10 years: Unknown If all of the above answers are "NO", then may proceed with Cephalosporin use.   Marland Kitchen Fluconazole Rash    burning sensation,     Injections requested   Procedure note for bilateral knee injections  Procedure note left knee injection verbal consent was obtained to inject left knee joint  Timeout was completed to confirm the site of injection  The medications used were 40 mg of Depo-Medrol and 1% lidocaine 3 cc  Anesthesia was provided by ethyl chloride and the skin was prepped with alcohol.  After cleaning the skin with alcohol a 20-gauge needle was used to inject the left knee joint. There were no complications. A sterile bandage was applied.   Procedure note right knee injection verbal consent was obtained to inject right knee joint  Timeout was completed to confirm the site of injection  The medications used were 40 mg of Depo-Medrol and 1% lidocaine 3 cc  Anesthesia was provided by ethyl chloride and the skin was prepped with alcohol.  After cleaning the skin with alcohol a 20-gauge needle was used to inject the right knee joint. There were no complications. A sterile bandage was applied.   Encounter Diagnoses  Name Primary?  Marland Kitchen Arthritis of knee Yes  . Primary localized osteoarthritis of knee   . Arthritis of left knee   . Body mass index 45.0-49.9, adult ([place])   . Morbid obesity  ([place])                      The patient meets the AMA guidelines for Morbid (severe) obesity with a BMI > 40.0 and I have recommended weight loss.   Return a year

## 2019-09-13 NOTE — Patient Instructions (Signed)

## 2019-09-14 ENCOUNTER — Ambulatory Visit (HOSPITAL_COMMUNITY)
Admission: RE | Admit: 2019-09-14 | Discharge: 2019-09-14 | Disposition: A | Discharge status: Home / Self Care | Payer: Medicare PPO | Source: Ambulatory Visit | Attending: Nurse Practitioner | Admitting: Nurse Practitioner

## 2019-09-14 DIAGNOSIS — Z1231 Encounter for screening mammogram for malignant neoplasm of breast: Secondary | ICD-10-CM | POA: Diagnosis present

## 2019-09-17 ENCOUNTER — Encounter (HOSPITAL_COMMUNITY): Payer: Self-pay

## 2019-09-17 ENCOUNTER — Emergency Department (HOSPITAL_COMMUNITY)
Admission: EM | Admit: 2019-09-17 | Discharge: 2019-09-17 | Disposition: A | Discharge status: Home / Self Care | Payer: Medicare PPO | Attending: Emergency Medicine | Admitting: Emergency Medicine

## 2019-09-17 ENCOUNTER — Other Ambulatory Visit: Payer: Self-pay

## 2019-09-17 DIAGNOSIS — M170 Bilateral primary osteoarthritis of knee: Secondary | ICD-10-CM | POA: Diagnosis not present

## 2019-09-17 DIAGNOSIS — Z9889 Other specified postprocedural states: Secondary | ICD-10-CM | POA: Insufficient documentation

## 2019-09-17 DIAGNOSIS — Z79899 Other long term (current) drug therapy: Secondary | ICD-10-CM | POA: Diagnosis not present

## 2019-09-17 DIAGNOSIS — M25562 Pain in left knee: Secondary | ICD-10-CM | POA: Diagnosis not present

## 2019-09-17 DIAGNOSIS — G8929 Other chronic pain: Secondary | ICD-10-CM

## 2019-09-17 DIAGNOSIS — I10 Essential (primary) hypertension: Secondary | ICD-10-CM | POA: Diagnosis not present

## 2019-09-17 NOTE — ED Triage Notes (Signed)
Patient complains of ongoing bilateral knee pain, has had injections in both. Patient reports increased left knee after using restroom Friday, denies fall, no trauma

## 2019-09-17 NOTE — Discharge Instructions (Addendum)
Please call tomorrow to make an appointment with your orthopedic doctor.  Please inform him of your knee symptoms.  Please use Tylenol or ibuprofen for pain.  You may use 600 mg ibuprofen every 6 hours or 1000 mg of Tylenol every 6 hours.  You may choose to alternate between the 2.  This would be most effective.  Not to exceed 4 g of Tylenol within 24 hours.  Not to exceed 3200 mg ibuprofen 24 hours.

## 2019-09-17 NOTE — ED Provider Notes (Signed)
[facility] EMERGENCY DEPARTMENT Provider Note   CSN: [id number] Arrival date & time: 09/17/19  1405     History No chief complaint on file.   Cathy Thomas is a 64 y.o. female.  HPI  Patient is a 64 year old female with a past medical history of fibromyalgia, chronic left knee pain with severe osteoarthritis confirmed by x-rays and knee arthroscopy.  Patient presents today for left knee pain that she describes as chronic but acutely worsened, 9/10, severe, worse with movements and walking, posterior in location and nonradiating with achy character.  Patient complains today of ongoing bilateral knee pain left greater than right.  She states that she is seen by her orthopedic doctor on Wednesday and had a cortisone injection in both knees--at that time she also had complete bilateral knee x-rays with sunrise view that she states showed "severe osteoarthritis ".  She states her symptoms were somewhat improved at that time however on Friday she was standing up after using the toilet but she had a sudden pain in her left knee.  She states it was a sign her left knee has hurt considerably more than usual.  Patient also has a history of hypertension states that she has not taken her medications today.  Denies any chest pain, shortness of breath, headache, denies any dizziness.    Past Medical History:  Diagnosis Date  . Fibromyalgia   . Headache   . Hypertension   . Kidney stone   . Stroke ([place])   . TIA (transient ischemic attack)     Patient Active Problem List   Diagnosis Date Noted  . Essential hypertension 02/22/2019  . Hyperlipidemia 02/22/2019  . Bilateral leg pain 02/22/2019  . BV (bacterial vaginosis) 04/24/2015    Past Surgical History:  Procedure Laterality Date  . ABDOMINAL HYSTERECTOMY    . BALLOON DILATION  12/16/2011   Procedure: BALLOON DILATION;  Surgeon: Marissa Nestle, MD;  Location: [facility];  Service: Urology;  Laterality: Left;  .  CESAREAN SECTION    . COLONOSCOPY N/A 01/18/2018   Procedure: COLONOSCOPY;  Surgeon: Daneil Dolin, MD;  Location: [facility] SU[facility];  Service: Endoscopy;  Laterality: N/A;  10:15  . KNEE ARTHROSCOPY    . STONE EXTRACTION WITH BASKET  12/16/2011   Procedure: STONE EXTRACTION WITH BASKET;  Surgeon: Marissa Nestle, MD;  Location: [facility];  Service: Urology;  Laterality: Left;     OB History   No obstetric history on file.     Family History  Problem Relation Age of Onset  . Cancer Mother        ovarian  . Hypertension Mother   . Heart disease Mother   . Diabetes Mother   . Cancer Father        lung, prostate  . Hypertension Father   . Colon cancer Neg Hx     Social History   Tobacco Use  . Smoking status: Never Smoker  . Smokeless tobacco: Never Used  Substance Use Topics  . Alcohol use: No  . Drug use: No    Home Medications Prior to Admission medications   Medication Sig Start Date End Date Taking? Authorizing Provider  aspirin EC 81 MG tablet Take 81 mg by mouth daily.    [provider]  atorvastatin (LIPITOR) 20 MG tablet Take 1 tablet (20 mg total) by mouth daily. 12/02/18 03/02/19  Herminio Commons, MD  losartan (COZAAR) 50 MG tablet Take 100 mg by mouth daily.  [provider]  LYRICA 75 MG capsule Take 75-150 mg by mouth 2 (two) times daily. Takes 2 capsules in the morning and 1 capsule at bedtime. 05/31/14   [provider]  magnesium gluconate (MAGONATE) 500 MG tablet Take 500 mg by mouth daily.     [provider]  methocarbamol (ROBAXIN) 500 MG tablet Take 1 tablet (500 mg total) by mouth 3 (three) times daily. 08/08/19   Triplett, Tammy, PA-C  Vitamin D, Ergocalciferol, (DRISDOL) 50000 units CAPS capsule Take 50,000 Units by mouth every 7 (seven) days. Takes on Fridays    [provider]    Allergies    Amoxicillin and Fluconazole  Review of Systems   Review of Systems  Constitutional: Negative for chills  and fever.  HENT: Negative for congestion.   Respiratory: Negative for cough and shortness of breath.   Cardiovascular: Negative for chest pain and leg swelling.  Gastrointestinal: Negative for abdominal pain and vomiting.  Genitourinary: Negative for dysuria.  Musculoskeletal: Negative for myalgias.       Knee pain bilateral - chronic Worsening rt knee pain  Skin: Negative for rash.  Neurological: Negative for dizziness and headaches.  Psychiatric/Behavioral: Negative for sleep disturbance.    Physical Exam Updated Vital Signs BP (!) 177/107   Pulse 79   Temp 98.4 F (36.9 C) (Oral)   Resp 20   SpO2 98%   Physical Exam Vitals and nursing note reviewed.  Constitutional:      General: She is not in acute distress.    Appearance: Normal appearance. She is not ill-appearing.  HENT:     Head: Normocephalic and atraumatic.     Mouth/Throat:     Mouth: Mucous membranes are moist.  Eyes:     General: No scleral icterus.       Right eye: No discharge.        Left eye: No discharge.     Conjunctiva/sclera: Conjunctivae normal.  Pulmonary:     Effort: Pulmonary effort is normal.     Breath sounds: No stridor.  Musculoskeletal:     Comments: Bilateral extremities are large, proportional to obese body habitus.  No unilateral leg swelling or edema.  Pulses in bilateral lower extremities at the [facility] and PT are 3+ and symmetric, strength 5/5 dorsi and plantar flexion bilaterally, knee flexion extension bilaterally, hip abduction and abduction.  No weakness on exam however she does seem to be in significant discomfort with standing.  No redness, swelling, crepitus.  Left knee with full extension and flexion to just past 90 degrees--at which point ROM is limited secondary to pain.  Skin:    Comments: No calf swelling and tenderness.  No varicose veins.  Negative Homans' sign.  Neurological:     Mental Status: She is alert and oriented to person, place, and time. Mental status is at  baseline.     Comments: Position bilateral lower extremities intact.  Ankle reflexes intact  Psychiatric:        Mood and Affect: Mood normal.        Behavior: Behavior normal.     ED Results / Procedures / Treatments   Labs (all labs ordered are listed, but only abnormal results are displayed) Labs Reviewed - No data to display  EKG None  Radiology No results found.  Procedures Procedures (including critical care time)  Medications Ordered in ED Medications - No data to display  ED Course  I have reviewed the triage vital signs and the nursing  notes.  Pertinent labs & imaging results that were available during my care of the patient were reviewed by me and considered in my medical decision making (see chart for details).    MDM Rules/Calculators/A&P                      Patient is a 64 year old female with chronic bilateral knee pain due to osteoarthritis which is been confirmed by her orthopedic doctor.  She has presented today for worsened left knee pain that occurred after she was standing up from the toilet 3 days ago.  She has an unremarkable physical exam with only abnormality being mildly decreased range of motion of flexion of the left knee.  Doubt fracture there is no bony tenderness, step-off, deformity and reassuringly benign mechanism of injury.  Doubt cellulitic infection as there is no skin changes, warmth, tenderness and there is no fluctuance indicating abscess.  Also doubt septic arthritis as patient has nearly full range of motion there is no redness or swelling of the joint.  Doubt DVT as her leg does not appear to be unilaterally swollen happiness, history of blood clots--furthermore patient had clear inciting events with standing up from the toilet.  Although this is an atraumatic knee pain it does appear to be the result of her attempting to stand up from the toilet.  She is severely obese and suspect that her weight is contributing to her osteoarthritis and  her current knee pain.  Patient is followed closely by her orthopedic doctor.  I will discharge her and recommend follow-up call to the office on Monday to discuss her knee pain.  Also recommended Tylenol and ibuprofen and gave dosing recommendations based off her lack of GI bleed history of other kidney problems or liver disease.  She was incidentally found to have an asymptomatic elevated blood pressure during her ED visit today, she tells me that she has not taken her blood pressure medications today.  I recommended that she continue to monitor her blood pressure follow-up with primary care doctor.  She is agreeable to this plan.  I doubt any other emergency medical condition I gave patient strict return cautions which she was able to teach back.   Final Clinical Impression(s) / ED Diagnoses Final diagnoses:  Acute pain of left knee  Chronic pain of left knee    Rx / DC Orders ED Discharge Orders    None       Tedd Sias, [place] 09/18/19 1312    Charlesetta Shanks, MD 09/18/19 1606

## 2019-09-18 ENCOUNTER — Other Ambulatory Visit: Payer: Self-pay | Admitting: Cardiovascular Disease

## 2019-09-18 ENCOUNTER — Telehealth: Payer: Self-pay | Admitting: Radiology

## 2019-09-18 DIAGNOSIS — I739 Peripheral vascular disease, unspecified: Secondary | ICD-10-CM

## 2019-09-18 NOTE — Telephone Encounter (Signed)
Rest   Shes got a bmi 40f over 40

## 2019-09-18 NOTE — Telephone Encounter (Signed)
I called to advise told her to rest / ice. Not a surgical candidate currently.

## 2019-09-18 NOTE — Telephone Encounter (Signed)
Patient called, said her knees have flared.  She went to [facility] ED yesterday, and is now walking on crutches.  Does she need to come see you?  What do you recommend?  Does she need to proceed with surgery now?  Please requesting you all call her.

## 2019-10-02 ENCOUNTER — Encounter: Payer: Self-pay | Admitting: Orthopedic Surgery

## 2019-10-02 ENCOUNTER — Ambulatory Visit: Payer: Medicare PPO

## 2019-10-02 ENCOUNTER — Telehealth: Payer: Self-pay | Admitting: Orthopedic Surgery

## 2019-10-02 ENCOUNTER — Other Ambulatory Visit: Payer: Self-pay

## 2019-10-02 ENCOUNTER — Ambulatory Visit (INDEPENDENT_AMBULATORY_CARE_PROVIDER_SITE_OTHER): Payer: Medicare PPO | Admitting: Orthopedic Surgery

## 2019-10-02 VITALS — BP 131/84 | HR 85 | Temp 98.0°F | Ht 67.0 in | Wt 278.0 lb

## 2019-10-02 DIAGNOSIS — G8929 Other chronic pain: Secondary | ICD-10-CM

## 2019-10-02 DIAGNOSIS — S86812A Strain of other muscle(s) and tendon(s) at lower leg level, left leg, initial encounter: Secondary | ICD-10-CM

## 2019-10-02 DIAGNOSIS — M25562 Pain in left knee: Secondary | ICD-10-CM | POA: Diagnosis not present

## 2019-10-02 MED ORDER — IBUPROFEN 800 MG PO TABS: 800.0000 mg | ORAL_TABLET | Freq: Three times a day (TID) | ORAL | 1 refills | Status: DC | PRN

## 2019-10-02 MED ORDER — ACETAMINOPHEN-CODEINE #3 300-30 MG PO TABS: 1.0000 | ORAL_TABLET | Freq: Four times a day (QID) | ORAL | 0 refills | Status: DC | PRN

## 2019-10-02 NOTE — Telephone Encounter (Signed)
Fax [phone] will fax new order, they do not have any playmaker braces.  Spoke to [employer] order for hinged knee brace, per Dr Aline Brochure ok.

## 2019-10-02 NOTE — Telephone Encounter (Signed)
Juliette Mangle from [facility] called stating she needs clarification on the brace for this patient  Please call Sharee Pimple at [facility] at [phone] #2  Thanks so much

## 2019-10-02 NOTE — Progress Notes (Signed)
Chief Complaint  Patient presents with  . Follow-up    Recheck on left knee.    64 years old followed for knee arthritis probably needs replacement but BMI is 43 got an injection last office visit then on the week of February 10 stood up from a seated position felt acute pain in the front of the knee and has had trouble walking since pain appears to be anterior near the patellar tendon insertion although she can extend the knee although pain with flexing the knee from extended position and against resistance  System review no fever no chills no weight loss seems to have some back pain when she is standing and some tingling, she has had to use crutches has been able to go to work  Past Medical History:  Diagnosis Date  . Fibromyalgia   . Headache   . Hypertension   . Kidney stone   . Stroke ([place])   . TIA (transient ischemic attack)     Past Surgical History:  Procedure Laterality Date  . ABDOMINAL HYSTERECTOMY    . BALLOON DILATION  12/16/2011   Procedure: BALLOON DILATION;  Surgeon: Marissa Nestle, MD;  Location: [facility];  Service: Urology;  Laterality: Left;  . CESAREAN SECTION    . COLONOSCOPY N/A 01/18/2018   Procedure: COLONOSCOPY;  Surgeon: Daneil Dolin, MD;  Location: [facility];  Service: Endoscopy;  Laterality: N/A;  10:15  . KNEE ARTHROSCOPY    . STONE EXTRACTION WITH BASKET  12/16/2011   Procedure: STONE EXTRACTION WITH BASKET;  Surgeon: Marissa Nestle, MD;  Location: [facility];  Service: Urology;  Laterality: Left;    BP 131/84   Pulse 85   Temp 98 F (36.7 C)   Ht 5\' 7"  (1.702 m)   Wt 278 lb (126.1 kg)   BMI 43.54 kg/m   Normal development grooming and hygiene  Neurovascular exam intact   Skin normal  Left knee  Tenderness lateral to the patellar tendon insertion medial lateral joint line Passive range of motion is normal but has pain with flexion near the patella tendon insertion Pain against resistance on extension Collateral ligaments and  cruciate ligaments stable Muscle tone normal Neurovascular exam intact  Gait ambulation with crutches weightbearing  New x-rays were obtained  Recommend MRI left knee not sure on the diagnosis here may be strained the patellar tendon based on the areas of tenderness  We will put her on some pain medicine for 7 days have her do some range of motion exercises and see what the MRI shows  Encounter Diagnoses  Name Primary?  . Chronic pain of left knee   . Patellar tendon strain, left, initial encounter Yes   Meds ordered this encounter  Medications  . ibuprofen (ADVIL) 800 MG tablet    Sig: Take 1 tablet (800 mg total) by mouth every 8 (eight) hours as needed.    Dispense:  90 tablet    Refill:  1  . acetaminophen-codeine (TYLENOL #3) 300-30 MG tablet    Sig: Take 1 tablet by mouth every 6 (six) hours as needed for moderate pain.    Dispense:  30 tablet    Refill:  0

## 2019-10-02 NOTE — Telephone Encounter (Signed)
Patient called to relay that she has found a provider for her brace and would like to have the prescription and whatever is needed, faxed to the following: [facility], [place], ph#[phone] / fax# [phone]  - please cancel any other order for her brace

## 2019-10-02 NOTE — Telephone Encounter (Signed)
Okay faxed to number provided

## 2019-10-06 ENCOUNTER — Telehealth: Payer: Self-pay | Admitting: Orthopedic Surgery

## 2019-10-06 NOTE — Telephone Encounter (Signed)
Cathy Thomas called this morning asking how long does it take to get an MRI?  I told her that sometimes we have to wait for the insurance company to give approval.  I told her that I would check with the clinical staff to see where we are with this.  She asks that someone please call her back  Thanks

## 2019-10-06 NOTE — Telephone Encounter (Signed)
I have called her, insurance has approved this, gave her the number to [facility] imaging to call and schedule

## 2019-10-06 NOTE — Telephone Encounter (Signed)
Patient has questions about the brace. Said she did go to the alternative supplier, [facility], whom she is not happy with. Requests that we fax prescription to another [facility] provider, [place], [place], [place]. Ph# [phone] / Fax# [phone]  - pt also just stopped into office to ask if she may have a copy of the prescription. Michela Pitcher that the very first medical supplier, [employer], kept it, in event she decides to get it there as self-pay.

## 2019-10-09 NOTE — Telephone Encounter (Signed)
I have given her another order. On Friday when she was here.

## 2019-10-15 ENCOUNTER — Other Ambulatory Visit: Payer: Self-pay

## 2019-10-15 ENCOUNTER — Ambulatory Visit
Admission: RE | Admit: 2019-10-15 | Discharge: 2019-10-15 | Disposition: A | Discharge status: Home / Self Care | Payer: Medicare PPO | Source: Ambulatory Visit | Attending: Orthopedic Surgery | Admitting: Orthopedic Surgery

## 2019-10-15 DIAGNOSIS — G8929 Other chronic pain: Secondary | ICD-10-CM

## 2019-10-15 DIAGNOSIS — M25562 Pain in left knee: Secondary | ICD-10-CM

## 2019-10-15 DIAGNOSIS — S86812A Strain of other muscle(s) and tendon(s) at lower leg level, left leg, initial encounter: Secondary | ICD-10-CM

## 2019-10-17 ENCOUNTER — Telehealth: Payer: Self-pay | Admitting: Orthopedic Surgery

## 2019-10-17 DIAGNOSIS — M1712 Unilateral primary osteoarthritis, left knee: Secondary | ICD-10-CM

## 2019-10-17 NOTE — Telephone Encounter (Signed)
Ms. Allgyer called and wanted to know if you were going to call her with the MRI results or if she needed to make a follow up appointment.  Please advise  Thanks

## 2019-10-17 NOTE — Telephone Encounter (Signed)
I will call her.

## 2019-10-19 ENCOUNTER — Telehealth: Payer: Self-pay | Admitting: Orthopedic Surgery

## 2019-10-19 NOTE — Telephone Encounter (Signed)
Phone call to discuss  Arthroscopy knee  Patient has new meniscal tear lateral meniscus is 3 compartment arthrosis cannot walk having severe pain in her knee dysfunction noted.

## 2019-10-20 ENCOUNTER — Other Ambulatory Visit (HOSPITAL_COMMUNITY)
Admission: RE | Admit: 2019-10-20 | Discharge: 2019-10-20 | Disposition: A | Discharge status: Home / Self Care | Payer: Medicare PPO | Source: Ambulatory Visit | Attending: Orthopedic Surgery | Admitting: Orthopedic Surgery

## 2019-10-20 ENCOUNTER — Other Ambulatory Visit: Payer: Self-pay | Admitting: Radiology

## 2019-10-20 ENCOUNTER — Encounter (HOSPITAL_COMMUNITY): Payer: Self-pay

## 2019-10-20 ENCOUNTER — Telehealth: Payer: Self-pay | Admitting: Radiology

## 2019-10-20 ENCOUNTER — Other Ambulatory Visit: Payer: Self-pay | Admitting: Orthopedic Surgery

## 2019-10-20 ENCOUNTER — Encounter (HOSPITAL_COMMUNITY)
Admission: RE | Admit: 2019-10-20 | Discharge: 2019-10-20 | Disposition: A | Discharge status: Home / Self Care | Payer: Medicare PPO | Source: Ambulatory Visit | Attending: Orthopedic Surgery | Admitting: Orthopedic Surgery

## 2019-10-20 ENCOUNTER — Other Ambulatory Visit: Payer: Self-pay

## 2019-10-20 DIAGNOSIS — M25562 Pain in left knee: Secondary | ICD-10-CM

## 2019-10-20 DIAGNOSIS — Z01812 Encounter for preprocedural laboratory examination: Secondary | ICD-10-CM | POA: Insufficient documentation

## 2019-10-20 DIAGNOSIS — Z20822 Contact with and (suspected) exposure to covid-19: Secondary | ICD-10-CM | POA: Diagnosis not present

## 2019-10-20 DIAGNOSIS — G8929 Other chronic pain: Secondary | ICD-10-CM

## 2019-10-20 LAB — CBC WITH DIFFERENTIAL/PLATELET
Abs Immature Granulocytes: 0.03 10*3/uL (ref 0.00–0.07)
Basophils Absolute: 0.1 10*3/uL (ref 0.0–0.1)
Basophils Relative: 1 %
Eosinophils Absolute: 0.3 10*3/uL (ref 0.0–0.5)
Eosinophils Relative: 3 %
HCT: 37.4 % (ref 36.0–46.0)
Hemoglobin: 11.6 g/dL — ABNORMAL LOW (ref 12.0–15.0)
Immature Granulocytes: 0 %
Lymphocytes Relative: 23 %
Lymphs Abs: 2.2 10*3/uL (ref 0.7–4.0)
MCH: 25.7 pg — ABNORMAL LOW (ref 26.0–34.0)
MCHC: 31 g/dL (ref 30.0–36.0)
MCV: 82.7 fL (ref 80.0–100.0)
Monocytes Absolute: 0.6 10*3/uL (ref 0.1–1.0)
Monocytes Relative: 6 %
Neutro Abs: 6.6 10*3/uL (ref 1.7–7.7)
Neutrophils Relative %: 67 %
Platelets: 402 10*3/uL — ABNORMAL HIGH (ref 150–400)
RBC: 4.52 MIL/uL (ref 3.87–5.11)
RDW: 15.9 % — ABNORMAL HIGH (ref 11.5–15.5)
WBC: 9.8 10*3/uL (ref 4.0–10.5)
nRBC: 0 % (ref 0.0–0.2)

## 2019-10-20 LAB — BASIC METABOLIC PANEL
Anion gap: 7 (ref 5–15)
BUN: 12 mg/dL (ref 8–23)
CO2: 27 mmol/L (ref 22–32)
Calcium: 8.8 mg/dL — ABNORMAL LOW (ref 8.9–10.3)
Chloride: 106 mmol/L (ref 98–111)
Creatinine, Ser: 0.62 mg/dL (ref 0.44–1.00)
GFR calc Af Amer: >60 mL/min (ref >60)
GFR calc non Af Amer: >60 mL/min (ref >60)
Glucose, Bld: 127 mg/dL — ABNORMAL HIGH (ref 70–99)
Potassium: 3.5 mmol/L (ref 3.5–5.1)
Sodium: 140 mmol/L (ref 135–145)

## 2019-10-20 NOTE — Progress Notes (Signed)
a 

## 2019-10-20 NOTE — Patient Instructions (Signed)
Your procedure is scheduled on: 10/24/19  Report to [facility] at   6:15  AM.  Call this number if you have problems the morning of surgery: [phone]   Remember:   Do not Eat or Drink after midnight         No Smoking the morning of surgery  :  Take these medicines the morning of surgery with A SIP OF WATER: losartan and lyrica   Do not wear jewelry, make-up or nail polish.  Do not wear lotions, powders, or perfumes. You may wear deodorant.  Do not shave 48 hours prior to surgery. Men may shave face and neck.  Do not bring valuables to the hospital.  Contacts, dentures or bridgework may not be worn into surgery.  Leave suitcase in the car. After surgery it may be brought to your room.  For patients admitted to the hospital, checkout time is 11:00 AM the day of discharge.   Patients discharged the day of surgery will not be allowed to drive home.    Special Instructions: Shower using CHG night before surgery and shower the day of surgery use CHG.  Use special wash - you have one bottle of CHG for all showers.  You should use approximately 1/2 of the bottle for each shower.  Knee Arthroscopy, Care After This sheet gives you information about how to care for yourself after your procedure. Your health care provider may also give you more specific instructions. If you have problems or questions, contact your health care provider. What can I expect after the procedure? After the procedure, it is common to have:  Soreness.  Swelling.  Pain that can be relieved by taking pain medicine. Follow these instructions at home: Incision care   Follow instructions from your health care provider about how to take care of your incisions. Make sure you: ? Wash your hands with soap and water before you change your bandage (dressing). If soap and water are not available, use hand sanitizer. ? Change your dressing as told by your health care provider. ? Leave stitches (sutures), staples, skin  glue, or adhesive strips in place. These skin closures may need to stay in place for 2 weeks or longer. If adhesive strip edges start to loosen and curl up, you may trim the loose edges. Do not remove adhesive strips completely unless your health care provider tells you to do that.  Check your incision areas every day for signs of infection. Check for: ? Redness. ? More swelling or pain. ? Fluid or blood. ? Warmth. ? Pus or a bad smell. Bathing  Do not take baths, swim, or use a hot tub until your health care provider approves. Ask your health care provider if you may take showers. You may only be allowed to take sponge baths. Activity  Do not use your knee to support your body weight until your health care provider says that you can. Follow weight-bearing restrictions as told. Use crutches or other devices to help you move around (assistive devices) as directed.  Ask your health care provider what activities are safe for you during recovery, and what activities you need to avoid.  If physical therapy was prescribed, do exercises as directed. Doing exercises may help improve knee movement and flexibility (range of motion).  Do not lift anything that is heavier than 10 lb (4.5 kg), or the limit that you are told, until your health care provider says that it is safe. Driving  Do not drive  until your health care provider approves. You may be able to drive after 1-3 weeks.  Do not drive or use heavy machinery while taking prescription pain medicine. Managing pain, stiffness, and swelling   If directed, put ice on the injured area: ? Put ice in a plastic bag or use the icing device (cold therapy unit) that you were given. Follow instructions from your health care provider about how to use the icing device. ? Place a towel between your skin and the bag or between your skin and the icing device. ? Leave the ice on for 20 minutes, 2-3 times a day.  Move your toes often to avoid stiffness and  to lessen swelling.  Raise (elevate) the injured area above the level of your heart while you are sitting or lying down. If you are taking blood thinners:  Before you take any medicines that contain aspirin or NSAIDs, talk with your health care provider. These medicines increase your risk for dangerous bleeding.  Take your medicine exactly as told, at the same time every day.  Avoid activities that could cause injury or bruising, and follow instructions about how to prevent falls.  Wear a medical alert bracelet or carry a card that lists what medicines you take. General instructions  Take over-the-counter and prescription medicines only as told by your health care provider.  If you are taking prescription pain medicine, take actions to prevent or treat constipation. Your health care provider may recommend that you: ? Drink enough fluid to keep your urine pale yellow. ? Eat foods that are high in fiber, such as fresh fruits and vegetables, whole grains, and beans. ? Limit foods that are high in fat and processed sugars, such as fried or sweet foods. ? Take an over-the-counter or prescription medicines for constipation.  Do not use any products that contain nicotine or tobacco, such as cigarettes and e-cigarettes. These can delay incision or bone healing. If you need help quitting, ask your health care provider.  Wear compression stockings as told by your health care provider. These stockings help to prevent blood clots and reduce swelling in your legs.  Keep all follow-up visits as told by your health care provider. This is important. Contact a health care provider if you:  Have a fever.  Have severe pain.  Have redness around an incision.  Have more swelling.  Have fluid or blood coming from an incision.  Notice that an incision feels warm to the touch.  Notice pus or a bad smell coming from an incision.  Notice that an incision opens up.  Develop a rash. Get help right  away if you:  Have difficulty breathing.  Have shortness of breath.  Have chest pain.  Develop pain in your lower leg or at the back of your knee.  Have numbness or tingling in your lower leg or your foot. Summary  Raise (elevate) the injured area above the level of your heart while you are sitting or lying down.  To help relieve pain and swelling, put ice on your leg for 20 minutes at a time, 2-3 times a day.  If you were prescribed a blood thinner, avoid activities that could cause injury or bruising, and follow instructions about how to prevent falls.  If physical therapy was prescribed, do exercises as directed. Doing exercises may help improve range of motion. This information is not intended to replace advice given to you by your health care provider. Make sure you discuss any questions you have  with your health care provider. Document Revised: 07/02/2017 Document Reviewed: 06/02/2017 Elsevier Patient Education  2020 [place].  Knee Arthroscopy Knee arthroscopy is a surgery to examine the inside of the knee joint and repair any damage to cartilage, surfaces, and other soft tissues around the joint. You may have this surgery if non-surgical treatment has not relieved your symptoms. Knee arthroscopy may be used to:  Repair a torn ligament or other torn tissues. Ligaments are tissues that connect bones to each other.  Remove bone fragments.  Remove a fluid-filled sac (cyst).  Treat kneecap (patella)problems.  Treat an advanced infection in the knee (septicknee). Arthroscopic surgery is done using a thin tube that has a light and camera on the end of it (arthroscope). The arthroscope is placed through a small incision, and the camera sends images to a screen in the operating room. The images are used to help perform the surgery. Tell a health care provider about:  Any allergies you have.  All medicines you are taking, including vitamins, herbs, eye drops, creams, and  over-the-counter medicines.  Any problems you or family members have had with anesthetic medicines.  Any blood disorders you have.  Any surgeries you have had.  Any medical conditions you have.  Whether you are pregnant or may be pregnant. What are the risks? Generally, this is a safe procedure. However, problems may occur, including:  Infection.  Bleeding.  Allergic reactions to medicines.  Damage to blood vessels, nerves, or tissues in the knee.  A blood clot that forms in the leg and travels to the lung (pulmonary embolism).  Failure of the surgery to relieve symptoms.  Knee stiffness. What happens before the procedure? Staying hydrated Follow instructions from your health care provider about hydration, which may include:  Up to 2 hours before the procedure - you may continue to drink clear liquids, such as water, clear fruit juice, black coffee, and plain tea. Eating and drinking restrictions Follow instructions from your health care provider about eating and drinking, which may include:  8 hours before the procedure - stop eating heavy meals or foods such as meat, fried foods, or fatty foods.  6 hours before the procedure - stop eating light meals or foods, such as toast or cereal.  6 hours before the procedure - stop drinking milk or drinks that contain milk.  2 hours before the procedure - stop drinking clear liquids. Medicines Ask your health care provider about:  Changing or stopping your regular medicines. This is especially important if you are taking diabetes medicines or blood thinners.  Taking medicines such as aspirin and ibuprofen. These medicines can thin your blood. Do not take these medicines unless your health care provider tells you to take them.  Taking over-the-counter medicines, vitamins, herbs, and supplements. Testing  Your knee may be examined. Your health care provider may move your knee or ask you to move it in specific ways to see how  much motion you have.  You may have blood tests.  You may have imaging tests, such as an X-ray, MRI, or CT scan.  You may have an electrocardiogram. This test records the electrical activity in the heart. General instructions  Do not drink alcohol unless your health care provider says that you can.  Do not use any products that contain nicotine or tobacco, such as cigarettes and e-cigarettes, for one month or more before your surgery. If you need help quitting, ask your health care provider.  Plan to have someone  take you home from the hospital or clinic.  Plan to have a responsible adult care for you for at least 24 hours after you leave the hospital or clinic. This is important. What happens during the procedure?   To lower your risk of infection: ? Your health care team will wash or sanitize their hands. ? Hair may be removed from the surgical area. ? Your skin will be washed with soap.  An IV will be inserted into one of your veins.  You will be given one or more of the following: ? A medicine to help you relax (sedative). ? A medicine to numb the knee area (local anesthetic). ? A medicine to make you fall asleep (general anesthetic). ? A medicine that is injected into an area of your body to numb everything below the injection site (regional anesthetic). This may be injected into your groin or thigh.  A cuff may be placed around your upper leg to slow blood flow to your lower leg during the procedure.  Several small incisions will be made around your knee.  Your knee joint will be rinsed (flushed) and filled with a germ-free solution (sterile saline). This expands the area to allow your surgeon to see the joint more clearly.  An arthroscope will be passed through one of your incisions, into your knee joint.  Other surgical instruments will be passed through the other incisions. Then, your surgeon will examine and repair your knee as needed.  The sterile saline will be  drained from your knee, and the cuff will be removed from your upper leg.  Your incisions will be closed with adhesive strips or stitches (sutures) and covered with a bandage (dressing). The procedure may vary among health care providers and hospitals. What happens after the procedure?  Your blood pressure, heart rate, breathing rate, and blood oxygen level will be monitored until the medicines you were given have worn off.  You will be given pain medicine as needed.  You may be given medicine to lower your risk of blood clots.  You may have to wear compression stockings. These stockings help to prevent blood clots and reduce swelling in your legs.  Your health care provider will give you instructions about how much body weight you can safely support on your leg (weight-bearing restrictions). You may be given crutches or other devices to help you move around (assistive devices).  You may be shown how to do physical therapy exercises to help you recover.  Do not drive until your health care provider approves. Summary  Knee arthroscopy is a surgery to examine or repair the inside of the knee joint.  Before the procedure, follow instructions from your health care provider about eating and drinking.  Plan to have someone take you home from the hospital or clinic. This information is not intended to replace advice given to you by your health care provider. Make sure you discuss any questions you have with your health care provider. Document Revised: 07/02/2017 Document Reviewed: 04/22/2017 Elsevier Patient Education  2020 [place] Anesthesia, Adult, Care After This sheet gives you information about how to care for yourself after your procedure. Your health care provider may also give you more specific instructions. If you have problems or questions, contact your health care provider. What can I expect after the procedure? After the procedure, the following side effects are  common:  Pain or discomfort at the IV site.  Nausea.  Vomiting.  Sore throat.  Trouble concentrating.  Feeling cold or chills.  Weak or tired.  Sleepiness and fatigue.  Soreness and body aches. These side effects can affect parts of the body that were not involved in surgery. Follow these instructions at home:  For at least 24 hours after the procedure:  Have a responsible adult stay with you. It is important to have someone help care for you until you are awake and alert.  Rest as needed.  Do not: ? Participate in activities in which you could fall or become injured. ? Drive. ? Use heavy machinery. ? Drink alcohol. ? Take sleeping pills or medicines that cause drowsiness. ? Make important decisions or sign legal documents. ? Take care of children on your own. Eating and drinking  Follow any instructions from your health care provider about eating or drinking restrictions.  When you feel hungry, start by eating small amounts of foods that are soft and easy to digest (bland), such as toast. Gradually return to your regular diet.  Drink enough fluid to keep your urine pale yellow.  If you vomit, rehydrate by drinking water, juice, or clear broth. General instructions  If you have sleep apnea, surgery and certain medicines can increase your risk for breathing problems. Follow instructions from your health care provider about wearing your sleep device: ? Anytime you are sleeping, including during daytime naps. ? While taking prescription pain medicines, sleeping medicines, or medicines that make you drowsy.  Return to your normal activities as told by your health care provider. Ask your health care provider what activities are safe for you.  Take over-the-counter and prescription medicines only as told by your health care provider.  If you smoke, do not smoke without supervision.  Keep all follow-up visits as told by your health care provider. This is  important. Contact a health care provider if:  You have nausea or vomiting that does not get better with medicine.  You cannot eat or drink without vomiting.  You have pain that does not get better with medicine.  You are unable to pass urine.  You develop a skin rash.  You have a fever.  You have redness around your IV site that gets worse. Get help right away if:  You have difficulty breathing.  You have chest pain.  You have blood in your urine or stool, or you vomit blood. Summary  After the procedure, it is common to have a sore throat or nausea. It is also common to feel tired.  Have a responsible adult stay with you for the first 24 hours after general anesthesia. It is important to have someone help care for you until you are awake and alert.  When you feel hungry, start by eating small amounts of foods that are soft and easy to digest (bland), such as toast. Gradually return to your regular diet.  Drink enough fluid to keep your urine pale yellow.  Return to your normal activities as told by your health care provider. Ask your health care provider what activities are safe for you. This information is not intended to replace advice given to you by your health care provider. Make sure you discuss any questions you have with your health care provider. Document Revised: 07/23/2017 Document Reviewed: 03/05/2017 Elsevier Patient Education  [place].

## 2019-10-20 NOTE — Telephone Encounter (Signed)
Cathy Thomas is trying to find a preop time for her, today they do not have one for Monday. She will let me know if we can not do this on Tuesday.

## 2019-10-20 NOTE — Telephone Encounter (Signed)
-----   Message from Carole Civil, MD sent at 10/19/2019  2:08 PM EDT ----- [facility] Hefferan can you schedule Juliane Poot for a knee arthroscopy for Tuesday, March 23 for lateral meniscectomy left knee

## 2019-10-21 LAB — SARS CORONAVIRUS 2 (TAT 6-24 HRS): SARS Coronavirus 2: NEGATIVE

## 2019-10-23 ENCOUNTER — Other Ambulatory Visit: Payer: Self-pay | Admitting: Orthopedic Surgery

## 2019-10-23 DIAGNOSIS — S86812A Strain of other muscle(s) and tendon(s) at lower leg level, left leg, initial encounter: Secondary | ICD-10-CM

## 2019-10-23 MED ORDER — ACETAMINOPHEN-CODEINE #3 300-30 MG PO TABS: 1.0000 | ORAL_TABLET | Freq: Four times a day (QID) | ORAL | 0 refills | Status: DC | PRN

## 2019-10-23 NOTE — Progress Notes (Signed)
Meds ordered this encounter  Medications  . acetaminophen-codeine (TYLENOL #3) 300-30 MG tablet    Sig: Take 1 tablet by mouth every 6 (six) hours as needed for moderate pain.    Dispense:  30 tablet    Refill:  0    FOR POST OP PAIN

## 2019-10-23 NOTE — H&P (Signed)
Chief Complaint  Patient presents with  . Follow-up      Recheck on left knee.      64 years old followed for knee arthritis probably needs replacement but BMI is 43 got an injection last office visit then on the week of February 10 stood up from a seated position felt acute pain in the front of the knee and has had trouble walking since pain appears to be anterior near the patellar tendon insertion although she can extend the knee although pain with flexing the knee from extended position and against resistance   System review no fever no chills no weight loss seems to have some back pain when she is standing and some tingling, she has had to use crutches has been able to go to work       Past Medical History:  Diagnosis Date  . Fibromyalgia    . Headache    . Hypertension    . Kidney stone    . Stroke ([place])    . TIA (transient ischemic attack)      Social History   Tobacco Use  . Smoking status: Never Smoker  . Smokeless tobacco: Never Used  Substance Use Topics  . Alcohol use: No  . Drug use: No           Past Surgical History:  Procedure Laterality Date  . ABDOMINAL HYSTERECTOMY      . BALLOON DILATION   12/16/2011    Procedure: BALLOON DILATION;  Surgeon: Marissa Nestle, MD;  Location: [facility];  Service: Urology;  Laterality: Left;  . CESAREAN SECTION      . COLONOSCOPY N/A 01/18/2018    Procedure: COLONOSCOPY;  Surgeon: Daneil Dolin, MD;  Location: [facility]ITE;  Service: Endoscopy;  Laterality: N/A;  10:15  . KNEE ARTHROSCOPY      . STONE EXTRACTION WITH BASKET   12/16/2011    Procedure: STONE EXTRACTION WITH BASKET;  Surgeon: Marissa Nestle, MD;  Location: [facility];  Service: Urology;  Laterality: Left;      Family History  Problem Relation Age of Onset  . Cancer Mother        ovarian  . Hypertension Mother   . Heart disease Mother   . Diabetes Mother   . Cancer Father        lung, prostate  . Hypertension Father   . Colon cancer Neg Hx     BP  131/84   Pulse 85   Temp 98 F (36.7 C)   Ht 5\' 7"  (1.702 m)   Wt 278 lb (126.1 kg)   BMI 43.54 kg/m    Normal development grooming and hygiene, body habitus endomorphic with obesity  Patient is awake alert and oriented x3  Mood and affect are normal   Neurovascular exam intact    Skin normal   Left knee   Tenderness lateral to the patellar tendon insertion medial lateral joint line Passive range of motion is normal but has pain with flexion near the patella tendon insertion Pain against resistance on extension Collateral ligaments and cruciate ligaments stable Muscle tone normal Neurovascular exam intact   Gait ambulation with crutches weightbearing  X-rays showed severe osteoarthritis of the knee  MRI showed anterior horn lateral meniscal tear with arthritis  Report included: CLINICAL DATA:  Knee instability, chronic left knee pain.   EXAM: MRI OF THE LEFT KNEE WITHOUT CONTRAST   TECHNIQUE: Multiplanar, multisequence MR imaging of the knee was performed. No intravenous  contrast was administered.   COMPARISON:  None.   FINDINGS: MENISCI   Medial meniscus: Degeneration of the medial meniscus. No discrete tear. Irregularity of the free edge of the body of the medial meniscus.   Lateral meniscus: Severe attenuation of the anterior horn of lateral meniscus which may reflect prior meniscectomy versus macerated meniscus. Complex signal within the body of the lateral meniscus concerning for a tear.   LIGAMENTS   Cruciates:  Intact ACL and PCL.   Collaterals: Medial collateral ligament is intact. Lateral collateral ligament complex is intact.   CARTILAGE   Patellofemoral: High-grade partial-thickness cartilage loss with areas of full-thickness cartilage loss of the lateral patellofemoral compartment. Partial-thickness cartilage loss of the medial patellofemoral compartment.   Medial: Partial-thickness cartilage loss of the medial  femorotibial compartment.   Lateral: High-grade partial-thickness cartilage loss with areas of full-thickness cartilage loss of the lateral femoral condyle. High-grade partial-thickness cartilage loss of the lateral tibial plateau.   Joint: No significant joint effusion. Normal Hoffa's fat. No plical thickening.   Popliteal Fossa:  No Baker cyst. Intact popliteus tendon.   Extensor Mechanism: Intact quadriceps tendon. Intact patellar tendon. Intact medial patellar retinaculum. Intact lateral patellar retinaculum. Intact MPFL.   Bones: No acute osseous abnormality. No aggressive osseous lesion. Tricompartmental marginal osteophytes. 18 mm well-circumscribed T2 hyperintense lobulated intramedullary bone lesion in the proximal tibial metaphysis with stippled internal low signal most consistent with a benign chondroid lesion such as an enchondroma.   Other: Muscles are normal. No fluid collection or hematoma.   IMPRESSION: 1. Tricompartmental cartilage abnormalities as described above. 2. Severe attenuation of the anterior horn of lateral meniscus which may reflect prior meniscectomy versus macerated meniscus. Complex signal within the body of the lateral meniscus concerning for a tear.     Electronically Signed   By: Kathreen Devoid   On: 10/16/2019 08:21   Diagnosis osteoarthritis of the left knee with torn anterior horn lateral meniscus  This patient was scheduled for knee replacement but was canceled by her insurance company.  Her knee then deteriorated and she was sent for an MRI which showed she had a lateral meniscus tear with the arthritis.  The patient cannot walk normally anymore she is having trouble with her activities of daily living she now has to use a walker for ambulation  She is amenable to arthroscopy of the knee understanding that she still needs the knee replacement but something needs to be done in the interim to improve her gait and function  The procedure  has been fully reviewed with the patient; The risks and benefits of surgery have been discussed and explained and understood. Alternative treatment has also been reviewed, questions were encouraged and answered. The postoperative plan is also been reviewed.   Planned procedure arthroscopy left knee partial lateral meniscectomy

## 2019-10-23 NOTE — Telephone Encounter (Signed)
1. I called her to make sure she has a walker, prior to surgery. She does not, and can not get it b/c she is in quarantine after the covid testing I have put order on your desk, she will have to get it after surgery, can you give to her at time of surgery   2. She is also asking about pain meds for after surgery. She wants to know if you can send in today for her, she uses pharmacy in [place], and will be staying in [place] after surgery, she would like to already have the medicine on hand.

## 2019-10-24 ENCOUNTER — Encounter (HOSPITAL_COMMUNITY): Payer: Self-pay | Admitting: Orthopedic Surgery

## 2019-10-24 ENCOUNTER — Ambulatory Visit (HOSPITAL_COMMUNITY): Payer: Medicare PPO | Admitting: Anesthesiology

## 2019-10-24 ENCOUNTER — Encounter (HOSPITAL_COMMUNITY)
Admission: RE | Disposition: A | Discharge status: Home / Self Care | Payer: Self-pay | Source: Ambulatory Visit | Attending: Orthopedic Surgery

## 2019-10-24 ENCOUNTER — Ambulatory Visit (HOSPITAL_COMMUNITY)
Admission: RE | Admit: 2019-10-24 | Discharge: 2019-10-24 | Disposition: A | Discharge status: Home / Self Care | Payer: Medicare PPO | Source: Ambulatory Visit | Attending: Orthopedic Surgery | Admitting: Orthopedic Surgery

## 2019-10-24 DIAGNOSIS — M23322 Other meniscus derangements, posterior horn of medial meniscus, left knee: Secondary | ICD-10-CM | POA: Diagnosis not present

## 2019-10-24 DIAGNOSIS — S83282A Other tear of lateral meniscus, current injury, left knee, initial encounter: Secondary | ICD-10-CM | POA: Insufficient documentation

## 2019-10-24 DIAGNOSIS — M797 Fibromyalgia: Secondary | ICD-10-CM | POA: Diagnosis not present

## 2019-10-24 DIAGNOSIS — I10 Essential (primary) hypertension: Secondary | ICD-10-CM | POA: Diagnosis not present

## 2019-10-24 DIAGNOSIS — M1712 Unilateral primary osteoarthritis, left knee: Secondary | ICD-10-CM

## 2019-10-24 DIAGNOSIS — E669 Obesity, unspecified: Secondary | ICD-10-CM | POA: Insufficient documentation

## 2019-10-24 DIAGNOSIS — Z6841 Body Mass Index (BMI) 40.0 and over, adult: Secondary | ICD-10-CM | POA: Insufficient documentation

## 2019-10-24 DIAGNOSIS — M23301 Other meniscus derangements, unspecified lateral meniscus, left knee: Secondary | ICD-10-CM

## 2019-10-24 DIAGNOSIS — X58XXXA Exposure to other specified factors, initial encounter: Secondary | ICD-10-CM | POA: Insufficient documentation

## 2019-10-24 DIAGNOSIS — S83242A Other tear of medial meniscus, current injury, left knee, initial encounter: Secondary | ICD-10-CM | POA: Diagnosis present

## 2019-10-24 DIAGNOSIS — Z8673 Personal history of transient ischemic attack (TIA), and cerebral infarction without residual deficits: Secondary | ICD-10-CM | POA: Diagnosis not present

## 2019-10-24 HISTORY — PX: KNEE ARTHROSCOPY WITH LATERAL MENISECTOMY: SHX6193

## 2019-10-24 SURGERY — ARTHROSCOPY, KNEE, WITH LATERAL MENISCECTOMY
Anesthesia: General | Site: Knee | Laterality: Left

## 2019-10-24 MED ORDER — BUPIVACAINE-EPINEPHRINE (PF) 0.5% -1:200000 IJ SOLN
INTRAMUSCULAR | Status: DC | PRN
  Administered 2019-10-24: 60 mL via PERINEURAL

## 2019-10-24 MED ORDER — ONDANSETRON HCL 4 MG/2ML IJ SOLN
4.0000 mg | Freq: Once | INTRAMUSCULAR | Status: AC
  Administered 2019-10-24: 4 mg via INTRAVENOUS

## 2019-10-24 MED ORDER — IBUPROFEN 800 MG PO TABS: 800.0000 mg | ORAL_TABLET | Freq: Three times a day (TID) | ORAL | 1 refills | Status: DC | PRN

## 2019-10-24 MED ORDER — EPINEPHRINE PF 1 MG/ML IJ SOLN
INTRAMUSCULAR | Status: AC
  Filled 2019-10-24: qty 6

## 2019-10-24 MED ORDER — IBUPROFEN 400 MG PO TABS
400.0000 mg | ORAL_TABLET | Freq: Once | ORAL | Status: AC
  Administered 2019-10-24: 10:00:00 400 mg via ORAL

## 2019-10-24 MED ORDER — BUPIVACAINE-EPINEPHRINE (PF) 0.5% -1:200000 IJ SOLN
INTRAMUSCULAR | Status: AC
  Filled 2019-10-24: qty 60

## 2019-10-24 MED ORDER — LACTATED RINGERS IV SOLN
INTRAVENOUS | Status: DC
  Administered 2019-10-24: 1000 mL via INTRAVENOUS

## 2019-10-24 MED ORDER — DEXAMETHASONE SODIUM PHOSPHATE 10 MG/ML IJ SOLN
INTRAMUSCULAR | Status: AC
  Filled 2019-10-24: qty 1

## 2019-10-24 MED ORDER — 0.9 % SODIUM CHLORIDE (POUR BTL) OPTIME
TOPICAL | Status: DC | PRN
  Administered 2019-10-24: 07:00:00 1000 mL

## 2019-10-24 MED ORDER — ONDANSETRON HCL 4 MG/2ML IJ SOLN
INTRAMUSCULAR | Status: DC | PRN
  Administered 2019-10-24: 4 mg via INTRAVENOUS

## 2019-10-24 MED ORDER — FENTANYL CITRATE (PF) 250 MCG/5ML IJ SOLN
INTRAMUSCULAR | Status: AC
  Filled 2019-10-24: qty 5

## 2019-10-24 MED ORDER — SODIUM CHLORIDE 0.9 % IR SOLN
Status: DC | PRN
  Administered 2019-10-24 (×3): 3000 mL

## 2019-10-24 MED ORDER — PROMETHAZINE HCL 12.5 MG PO TABS: 12.5000 mg | ORAL_TABLET | Freq: Four times a day (QID) | ORAL | 0 refills | Status: DC | PRN

## 2019-10-24 MED ORDER — IBUPROFEN 400 MG PO TABS
ORAL_TABLET | ORAL | Status: AC
  Filled 2019-10-24: qty 1

## 2019-10-24 MED ORDER — VANCOMYCIN HCL IN DEXTROSE 1-5 GM/200ML-% IV SOLN
1000.0000 mg | INTRAVENOUS | Status: AC
  Administered 2019-10-24: 08:00:00 1000 mg via INTRAVENOUS
  Filled 2019-10-24: qty 200

## 2019-10-24 MED ORDER — CHLORHEXIDINE GLUCONATE 4 % EX LIQD: 60.0000 mL | Freq: Once | CUTANEOUS | Status: DC

## 2019-10-24 MED ORDER — HYDROCODONE-ACETAMINOPHEN 7.5-325 MG PO TABS
ORAL_TABLET | ORAL | Status: AC
  Filled 2019-10-24: qty 1

## 2019-10-24 MED ORDER — LIDOCAINE HCL (CARDIAC) PF 50 MG/5ML IV SOSY
PREFILLED_SYRINGE | INTRAVENOUS | Status: DC | PRN
  Administered 2019-10-24: 60 mg via INTRAVENOUS

## 2019-10-24 MED ORDER — ONDANSETRON HCL 4 MG/2ML IJ SOLN
INTRAMUSCULAR | Status: AC
  Filled 2019-10-24: qty 2

## 2019-10-24 MED ORDER — MIDAZOLAM HCL 2 MG/2ML IJ SOLN
INTRAMUSCULAR | Status: AC
  Filled 2019-10-24: qty 2

## 2019-10-24 MED ORDER — FENTANYL CITRATE (PF) 100 MCG/2ML IJ SOLN
INTRAMUSCULAR | Status: DC | PRN
  Administered 2019-10-24: 25 ug via INTRAVENOUS
  Administered 2019-10-24: 50 ug via INTRAVENOUS
  Administered 2019-10-24: 25 ug via INTRAVENOUS

## 2019-10-24 MED ORDER — HYDROCODONE-ACETAMINOPHEN 7.5-325 MG PO TABS: 1.0000 | ORAL_TABLET | ORAL | 0 refills | Status: AC | PRN

## 2019-10-24 MED ORDER — ONDANSETRON HCL 4 MG/2ML IJ SOLN
INTRAMUSCULAR | Status: AC
  Filled 2019-10-24: qty 4

## 2019-10-24 MED ORDER — PROPOFOL 10 MG/ML IV BOLUS
INTRAVENOUS | Status: DC | PRN
  Administered 2019-10-24: 170 mg via INTRAVENOUS

## 2019-10-24 MED ORDER — LIDOCAINE 2% (20 MG/ML) 5 ML SYRINGE
INTRAMUSCULAR | Status: AC
  Filled 2019-10-24: qty 5

## 2019-10-24 MED ORDER — HYDROCODONE-ACETAMINOPHEN 7.5-325 MG PO TABS
1.0000 | ORAL_TABLET | Freq: Once | ORAL | Status: AC
  Administered 2019-10-24: 10:00:00 1 via ORAL

## 2019-10-24 MED ORDER — PROPOFOL 10 MG/ML IV BOLUS
INTRAVENOUS | Status: AC
  Filled 2019-10-24: qty 40

## 2019-10-24 MED ORDER — HYDROMORPHONE HCL 1 MG/ML IJ SOLN
0.2500 mg | INTRAMUSCULAR | Status: DC | PRN
  Administered 2019-10-24: 0.5 mg via INTRAVENOUS
  Filled 2019-10-24: qty 0.5

## 2019-10-24 SURGICAL SUPPLY — 52 items
APL PRP STRL LF DISP 70% ISPRP (MISCELLANEOUS) ×2
BLADE AGGRESSIVE PLUS 4.0 (BLADE) ×3 IMPLANT
BLADE SURG SZ11 CARB STEEL (BLADE) ×3 IMPLANT
BNDG CMPR STD VLCR NS LF 5.8X6 (GAUZE/BANDAGES/DRESSINGS) ×2
BNDG ELASTIC 6X5.8 VLCR NS LF (GAUZE/BANDAGES/DRESSINGS) ×5 IMPLANT
CHLORAPREP W/TINT 26 (MISCELLANEOUS) ×6 IMPLANT
CLOTH BEACON ORANGE TIMEOUT ST (SAFETY) ×3 IMPLANT
COOLER CRYO IC GRAV AND TUBE (ORTHOPEDIC SUPPLIES) ×3 IMPLANT
COVER WAND RF STERILE (DRAPES) ×3 IMPLANT
CUFF CRYO KNEE LG 20X31 COOLER (ORTHOPEDIC SUPPLIES) ×2 IMPLANT
CUFF TOURN SGL QUICK 42 (TOURNIQUET CUFF) ×2 IMPLANT
CUTTER ANGLED DBL BITE 4.5 (BURR) ×2 IMPLANT
DECANTER SPIKE VIAL GLASS SM (MISCELLANEOUS) ×6 IMPLANT
DRAPE HALF SHEET 40X57 (DRAPES) ×3 IMPLANT
GAUZE 4X4 16PLY RFD (DISPOSABLE) ×3 IMPLANT
GAUZE SPONGE 4X4 12PLY STRL (GAUZE/BANDAGES/DRESSINGS) ×3 IMPLANT
GAUZE XEROFORM 5X9 LF (GAUZE/BANDAGES/DRESSINGS) ×3 IMPLANT
GLOVE BIO SURGEON STRL SZ7 (GLOVE) ×2 IMPLANT
GLOVE BIOGEL PI IND STRL 7.0 (GLOVE) ×1 IMPLANT
GLOVE BIOGEL PI INDICATOR 7.0 (GLOVE) ×2
GLOVE SKINSENSE NS SZ8.0 LF (GLOVE) ×2
GLOVE SKINSENSE STRL SZ8.0 LF (GLOVE) ×1 IMPLANT
GLOVE SS N UNI LF 8.5 STRL (GLOVE) ×3 IMPLANT
GOWN STRL REUS W/TWL LRG LVL3 (GOWN DISPOSABLE) ×3 IMPLANT
GOWN STRL REUS W/TWL XL LVL3 (GOWN DISPOSABLE) ×3 IMPLANT
HLDR LEG FOAM (MISCELLANEOUS) ×1 IMPLANT
IV NS IRRIG 3000ML ARTHROMATIC (IV SOLUTION) ×8 IMPLANT
KIT BLADEGUARD II DBL (SET/KITS/TRAYS/PACK) ×3 IMPLANT
KIT TURNOVER CYSTO (KITS) ×3 IMPLANT
LEG HOLDER FOAM (MISCELLANEOUS) ×3
MANIFOLD NEPTUNE II (INSTRUMENTS) ×3 IMPLANT
MARKER SKIN DUAL TIP RULER LAB (MISCELLANEOUS) ×3 IMPLANT
NDL HYPO 18GX1.5 BLUNT FILL (NEEDLE) ×1 IMPLANT
NDL HYPO 21X1.5 SAFETY (NEEDLE) ×1 IMPLANT
NDL SPNL 18GX3.5 QUINCKE PK (NEEDLE) ×1 IMPLANT
NEEDLE HYPO 18GX1.5 BLUNT FILL (NEEDLE) ×3 IMPLANT
NEEDLE HYPO 21X1.5 SAFETY (NEEDLE) ×3 IMPLANT
NEEDLE SPNL 18GX3.5 QUINCKE PK (NEEDLE) ×3 IMPLANT
NS IRRIG 1000ML POUR BTL (IV SOLUTION) ×3 IMPLANT
PACK ARTHRO LIMB DRAPE STRL (MISCELLANEOUS) ×3 IMPLANT
PAD ABD 5X9 TENDERSORB (GAUZE/BANDAGES/DRESSINGS) ×3 IMPLANT
PAD ARMBOARD 7.5X6 YLW CONV (MISCELLANEOUS) ×3 IMPLANT
PADDING CAST COTTON 6X4 STRL (CAST SUPPLIES) ×3 IMPLANT
PADDING WEBRIL 6 STERILE (GAUZE/BANDAGES/DRESSINGS) ×2 IMPLANT
PROBE BIPOLAR 50 DEGREE SUCT (MISCELLANEOUS) ×2 IMPLANT
SET ARTHROSCOPY INST (INSTRUMENTS) ×3 IMPLANT
SET BASIN LINEN APH (SET/KITS/TRAYS/PACK) ×3 IMPLANT
SUT ETHILON 3 0 FSL (SUTURE) ×3 IMPLANT
SYR 30ML LL (SYRINGE) ×3 IMPLANT
TUBE CONNECTING 12'X1/4 (SUCTIONS) ×2
TUBE CONNECTING 12X1/4 (SUCTIONS) ×4 IMPLANT
TUBING ARTHRO INFLOW-ONLY STRL (TUBING) ×3 IMPLANT

## 2019-10-24 NOTE — Anesthesia Preprocedure Evaluation (Signed)
Anesthesia Evaluation  Patient identified by MRN, date of birth, ID band Patient awake    Reviewed: Allergy & Precautions, H&P , NPO status , Patient's Chart, lab work & pertinent test results, reviewed documented beta blocker date and time   Airway Mallampati: II  TM Distance: >3 FB Neck ROM: full    Dental no notable dental hx. (+) Teeth Intact   Pulmonary neg pulmonary ROS,    Pulmonary exam normal        Cardiovascular hypertension, Normal cardiovascular exam     Neuro/Psych  Headaches, TIA Neuromuscular disease CVA negative psych ROS   GI/Hepatic negative GI ROS, Neg liver ROS,   Endo/Other  negative endocrine ROS  Renal/GU negative Renal ROS  negative genitourinary   Musculoskeletal   Abdominal   Peds  Hematology negative hematology ROS (+)   Anesthesia Other Findings   Reproductive/Obstetrics negative OB ROS                             Anesthesia Physical Anesthesia Plan  ASA: II  Anesthesia Plan: General   Post-op Pain Management:    Induction:   PONV Risk Score and Plan: 2 and Ondansetron  Airway Management Planned:   Additional Equipment:   Intra-op Plan:   Post-operative Plan:   Informed Consent: I have reviewed the patients History and Physical, chart, labs and discussed the procedure including the risks, benefits and alternatives for the proposed anesthesia with the patient or authorized representative who has indicated his/her understanding and acceptance.       Plan Discussed with: CRNA  Anesthesia Plan Comments:         Anesthesia Quick Evaluation

## 2019-10-24 NOTE — Transfer of Care (Signed)
Immediate Anesthesia Transfer of Care Note  Patient: TREMEKA FILTZ  Procedure(s) Performed: KNEE ARTHROSCOPY WITH MEDIAL AND LATERAL MENISECTOMY (Left Knee)  Patient Location: PACU  Anesthesia Type:General  Level of Consciousness: awake  Airway & Oxygen Therapy: Patient Spontanous Breathing  Post-op Assessment: Report given to RN  Post vital signs: Reviewed  Last Vitals:  Vitals Value Taken Time  BP 135/66 10/24/19 0856  Temp    Pulse 68 10/24/19 0900  Resp 18 10/24/19 0900  SpO2 98 % 10/24/19 0900  Vitals shown include unvalidated device data.  Last Pain:  Vitals:   10/24/19 0856  TempSrc:   PainSc: (P) 0-No pain      Patients Stated Pain Goal: 8 ([id number])  Complications: No apparent anesthesia complications

## 2019-10-24 NOTE — Anesthesia Postprocedure Evaluation (Signed)
Anesthesia Post Note  Patient: Cathy Thomas  Procedure(s) Performed: KNEE ARTHROSCOPY WITH MEDIAL AND LATERAL MENISECTOMY (Left Knee)  Patient location during evaluation: PACU Anesthesia Type: General Level of consciousness: awake and alert and oriented Pain management: pain level controlled Vital Signs Assessment: post-procedure vital signs reviewed and stable Respiratory status: spontaneous breathing Cardiovascular status: blood pressure returned to baseline and stable Postop Assessment: no apparent nausea or vomiting Anesthetic complications: no     Last Vitals:  Vitals:   10/24/19 0900 10/24/19 0915  BP:  130/64  Pulse: 68 72  Resp: 18 13  Temp:    SpO2: 98% 100%    Last Pain:  Vitals:   10/24/19 0856  TempSrc:   PainSc: 0-No pain                 Manoj Enriquez

## 2019-10-24 NOTE — Brief Op Note (Signed)
10/24/2019  9:06 AM  PATIENT:  Cathy Thomas  64 y.o. female  PRE-OPERATIVE DIAGNOSIS:  left knee lateral meniscus tear  POST-OPERATIVE DIAGNOSIS:  left knee lateral and medial meniscus tear  PROCEDURE:  Procedure(s): KNEE ARTHROSCOPY WITH MEDIAL AND LATERAL MENISECTOMY (Left KNEE); WITH CHONDROPLASTY LFC, AND MFC  SURGEON:  Surgeon(s) and Role:    Carole Civil, MD - Primary  PHYSICIAN ASSISTANT:   ASSISTANTS: NONE  ANESTHESIA:   general  EBL:  0 mL   BLOOD ADMINISTERED:none  DRAINS: none   LOCAL MEDICATIONS USED:  MARCAINE     SPECIMEN:  No Specimen  DISPOSITION OF SPECIMEN:  N/A  COUNTS:  YES  TOURNIQUET:  * Missing tourniquet times found for documented tourniquets in log: [id number] *  DICTATION: .Viviann Spare Dictation  PLAN OF CARE: Discharge to home after PACU  PATIENT DISPOSITION:  PACU - hemodynamically stable.   Delay start of Pharmacological VTE agent (>24hrs) due to surgical blood loss or risk of bleeding: not applicable  Cathy Thomas was seen in preop chart review was completed and surgical site was confirmed and marked.  Images were reviewed.  Patient was taken to the operating for general anesthesia after which time she was prepped and draped sterilely in the supine position  Timeout was executed  Spinal needle was used to enter the joint and a lateral portal was established.  The scope was placed into the joint and diagnostic arthroscopy was performed.  Findings Arthritis in all 3 compartments with a torn lateral meniscus starting from the body extending anteriorly, small posterior horn medial meniscal tear was noted.  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  Synovitis was noted to be moderate.  Several osteophytes are also noted in the patella and in the peripheral  femur.  Spinal needle was used to identify a medial portal which was made with an 11 blade blunt instrument was placed to dilate the portal and then a probe was placed to palpate the lateral meniscal tear.  The tear was oblique at the junction of the body and posterior horn and then extended with multiple areas of tearing anteriorly.  A motorized shaver was used to resect the meniscal tear this was supplemented with the use of a 50 degree ArthroCare wand until about stable rim was left  The 4.0 shaver blade was then placed in forward position and a chondroplasty was performed on the lateral femoral condyle to remove the lateral femoral condylar cartilage flap.  Then we used a 4 oh shaver blade to resect the posterior horn medial meniscus tear and perform a chondroplasty on the tibial plateau medially as well as the medial femoral condyle  Synovectomy was performed on the tissue posterior to the patellar tendon.  The joint was then irrigated.  The portals were closed with 3-0 nylon suture and 60 cc of Marcaine with epinephrine was injected into the joint  Sterile bandage was applied  The patient was in extubated and taken to recovery room in stable condition

## 2019-10-24 NOTE — Anesthesia Procedure Notes (Signed)
Procedure Name: LMA Insertion Date/Time: 10/24/2019 7:51 AM Performed by: Ollen Bowl, CRNA Pre-anesthesia Checklist: Patient identified, Patient being monitored, Emergency Drugs available, Timeout performed and Suction available Patient Re-evaluated:Patient Re-evaluated prior to induction Oxygen Delivery Method: Circle System Utilized Preoxygenation: Pre-oxygenation with 100% oxygen Induction Type: IV induction Ventilation: Mask ventilation without difficulty LMA: LMA inserted LMA Size: 4.0 Number of attempts: 1 Placement Confirmation: positive ETCO2 and breath sounds checked- equal and bilateral

## 2019-10-24 NOTE — Interval H&P Note (Signed)
History and Physical Interval Note:  10/24/2019 7:21 AM  Cathy Thomas  has presented today for surgery, with the diagnosis of left knee lateral meniscus tear.  The various methods of treatment have been discussed with the patient and family. After consideration of risks, benefits and other options for treatment, the patient has consented to  Procedure(s): KNEE ARTHROSCOPY WITH LATERAL MENISECTOMY (Left) as a surgical intervention.  The patient's history has been reviewed, patient examined, no change in status, stable for surgery.  I have reviewed the patient's chart and labs.  Questions were answered to the patient's satisfaction.     Arther Abbott

## 2019-10-24 NOTE — Op Note (Addendum)
10/24/2019  9:06 AM  PATIENT:  Cathy Thomas  64 y.o. female  PRE-OPERATIVE DIAGNOSIS:  left knee lateral meniscus tear  POST-OPERATIVE DIAGNOSIS:  left knee lateral and medial meniscus tear  PROCEDURE:  Procedure(s): KNEE ARTHROSCOPY WITH MEDIAL AND LATERAL MENISECTOMY (Left KNEE); WITH CHONDROPLASTY LFC, AND MFC: [id number]  SURGEON:  Surgeon(s) and Role:    Carole Civil, MD - Primary  PHYSICIAN ASSISTANT:   ASSISTANTS: NONE  ANESTHESIA:   general  EBL:  0 mL   BLOOD ADMINISTERED:none  DRAINS: none   LOCAL MEDICATIONS USED:  MARCAINE     SPECIMEN:  No Specimen  DISPOSITION OF SPECIMEN:  N/A  COUNTS:  YES  TOURNIQUET:  * Missing tourniquet times found for documented tourniquets in log: [id number] *  DICTATION: .Viviann Spare Dictation  PLAN OF CARE: Discharge to home after PACU  PATIENT DISPOSITION:  PACU - hemodynamically stable.   Delay start of Pharmacological VTE agent (>24hrs) due to surgical blood loss or risk of bleeding: not applicable  Cathy Thomas was seen in preop chart review was completed and surgical site was confirmed and marked.  Images were reviewed.  Patient was taken to the operating for general anesthesia after which time she was prepped and draped sterilely in the supine position  Timeout was executed  Spinal needle was used to enter the joint and a lateral portal was established.  The scope was placed into the joint and diagnostic arthroscopy was performed.  Findings Arthritis in all 3 compartments with a torn lateral meniscus starting from the body extending anteriorly, small posterior horn medial meniscal tear was noted.  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  Synovitis was noted to be moderate.  Several osteophytes are also noted in the patella and in the peripheral  femur.  Spinal needle was used to identify a medial portal which was made with an 11 blade blunt instrument was placed to dilate the portal and then a probe was placed to palpate the lateral meniscal tear.  The tear was oblique at the junction of the body and posterior horn and then extended with multiple areas of tearing anteriorly.  A motorized shaver was used to resect the meniscal tear this was supplemented with the use of a 50 degree ArthroCare wand until about stable rim was left  The 4.0 shaver blade was then placed in forward position and a chondroplasty was performed on the lateral femoral condyle to remove the lateral femoral condylar cartilage flap.  Then we used a 4 oh shaver blade to resect the posterior horn medial meniscus tear and perform a chondroplasty on the tibial plateau medially as well as the medial femoral condyle  Synovectomy was performed on the tissue posterior to the patellar tendon.  The joint was then irrigated.  The portals were closed with 3-0 nylon suture and 60 cc of Marcaine with epinephrine was injected into the joint  Sterile bandage was applied  The patient was in extubated and taken to recovery room in stable condition

## 2019-10-30 DIAGNOSIS — Z9889 Other specified postprocedural states: Secondary | ICD-10-CM | POA: Insufficient documentation

## 2019-11-01 ENCOUNTER — Other Ambulatory Visit: Payer: Self-pay

## 2019-11-01 ENCOUNTER — Encounter: Payer: Self-pay | Admitting: Orthopedic Surgery

## 2019-11-01 ENCOUNTER — Ambulatory Visit (INDEPENDENT_AMBULATORY_CARE_PROVIDER_SITE_OTHER): Payer: Medicare PPO | Admitting: Orthopedic Surgery

## 2019-11-01 DIAGNOSIS — Z9889 Other specified postprocedural states: Secondary | ICD-10-CM

## 2019-11-01 DIAGNOSIS — G8918 Other acute postprocedural pain: Secondary | ICD-10-CM

## 2019-11-01 MED ORDER — HYDROCODONE-ACETAMINOPHEN 5-325 MG PO TABS: 1.0000 | ORAL_TABLET | Freq: Four times a day (QID) | ORAL | 0 refills | Status: DC | PRN

## 2019-11-01 NOTE — Progress Notes (Signed)
Chief Complaint  Patient presents with  . Routine Post Op    10/24/19 left knee arthroscopy     Encounter Diagnosis  Name Primary?  . S/P left knee arthroscopy 10/24/19 Yes    Postop visit number one 64 year old female who was scheduled to have a total knee but the insurance company declined because of patient's body mass index.  The patient became nonambulatory secondary to left knee pain and was scheduled for arthroscopy  This is postop day #8  She is having pain in the back of both knees.  I think it is coming from the walker being too low and her having to lean over.  She says her left knee does not really feel any better but she is walking better with her walker.  The portals were cleaned the sutures were taken out  She can start home exercise program  Return to work  Okay to drive  Follow-up in 3 to 4 weeks  Meds ordered this encounter  Medications  . HYDROcodone-acetaminophen (NORCO/VICODIN) 5-325 MG tablet    Sig: Take 1 tablet by mouth every 6 (six) hours as needed for up to 7 days for moderate pain.    Dispense:  28 tablet    Refill:  0     Medication List at Discharge    Aspirin 81 mg Oral Daily at bedtime   Atorvastatin Calcium 20 MG TAKE 1 TABLET BY MOUTH ONCE DAILY.  Patient taking differently:  Take 20 mg by mouth at bedtime.    Ergocalciferol 50,000 Units Oral Every Fri   HYDROcodone-Acetaminophen 7.5-325 MG 1 tablet Oral Every 4 hours PRN   Ibuprofen 800 mg Oral Every 8 hours PRN   Losartan Potassium 50 mg Oral 2 times daily   Pregabalin 75-150 mg Oral See admin instructions, Takes 2 capsules (150 mg)  in the morning & if experiencing severe pain 1 capsule (75 mg) at bedtime.   Promethazine HCl 12.5 mg Oral Every 6 hours PRN

## 2019-11-01 NOTE — Patient Instructions (Addendum)
May return to work  May drive   Do home exercises twice daily

## 2019-11-13 ENCOUNTER — Telehealth: Payer: Self-pay | Admitting: Orthopedic Surgery

## 2019-11-13 NOTE — Telephone Encounter (Signed)
Please see note from patient.

## 2019-11-13 NOTE — Telephone Encounter (Signed)
Patient called to relay she is concerned that her post operative knee (left) is still hurting and swelling, and that the pain medication is not helping much. States she has gone back to work as per approval by Dr Aline Brochure. Pharmacy, if needed, is [facility], [place]. Please advise.

## 2019-11-13 NOTE — Telephone Encounter (Signed)
Done

## 2019-11-15 NOTE — Telephone Encounter (Signed)
Patient is asking if you would prescribe her something for inflammation,.  PATIENT USES [facility]

## 2019-11-20 ENCOUNTER — Other Ambulatory Visit: Payer: Self-pay | Admitting: Orthopedic Surgery

## 2019-11-20 DIAGNOSIS — Z9889 Other specified postprocedural states: Secondary | ICD-10-CM

## 2019-11-20 MED ORDER — MELOXICAM 7.5 MG PO TABS: 7.5000 mg | ORAL_TABLET | Freq: Every day | ORAL | 5 refills | Status: DC

## 2019-11-20 NOTE — Telephone Encounter (Signed)
I called her Discussed Apologized for delay

## 2019-11-20 NOTE — Progress Notes (Signed)
Meds ordered this encounter  Medications   meloxicam (MOBIC) 7.5 MG tablet    Sig: Take 1 tablet (7.5 mg total) by mouth daily.    Dispense:  30 tablet    Refill:  5    

## 2019-11-20 NOTE — Telephone Encounter (Signed)
Stop ibuprofen start meloxicam

## 2019-11-24 ENCOUNTER — Encounter: Payer: Self-pay | Admitting: Orthopedic Surgery

## 2019-11-24 ENCOUNTER — Ambulatory Visit (INDEPENDENT_AMBULATORY_CARE_PROVIDER_SITE_OTHER): Payer: Medicare PPO | Admitting: Orthopedic Surgery

## 2019-11-24 ENCOUNTER — Other Ambulatory Visit: Payer: Self-pay

## 2019-11-24 VITALS — BP 134/86 | HR 89 | Temp 98.2°F | Ht 67.0 in | Wt 278.0 lb

## 2019-11-24 DIAGNOSIS — Z9889 Other specified postprocedural states: Secondary | ICD-10-CM

## 2019-11-24 DIAGNOSIS — Z6841 Body Mass Index (BMI) 40.0 and over, adult: Secondary | ICD-10-CM

## 2019-11-24 NOTE — Progress Notes (Signed)
Chief Complaint  Patient presents with  . Follow-up    Recheck on left knee, DOS 10-24-19.    Encounter Diagnosis  Name Primary?  . S/P left knee arthroscopy 10/24/19 Yes    BP 134/86   Pulse 89   Temp 98.2 F (36.8 C)   Ht 5\' 7"  (1.702 m)   Wt 278 lb (126.1 kg)   BMI 43.20 kg/m  64 year old status post knee arthroscopy her knee is a little bit better on meloxicam but she still using a cane still having pain still having difficulty moving around  Her primary care doctor is Angelina Ok in [place] regard to call her for weight loss medication and then we can resubmit her paperwork for possible knee replacement but very unlikely they are going to prove they want her BMI to be 35   We re going to increase the meloxicam to bid   Work on weight loss   The patient meets the AMA guidelines for Morbid (severe) obesity with a BMI > 40.0 and I have recommended weight loss.   Follow-up in a month

## 2019-12-11 ENCOUNTER — Telehealth: Payer: Self-pay | Admitting: Orthopedic Surgery

## 2019-12-11 NOTE — Telephone Encounter (Signed)
I have not been given surgical orders so there would be no need to get authorization  We do this at time of surgery.   I called her to discuss   She is asking for a total knee replacement   Now/ she states she needed a replacement not arthroscopy and states it was because of her insurance  Please review and advise.

## 2019-12-11 NOTE — Telephone Encounter (Signed)
What???  I ll discuss later   I m so confused

## 2019-12-11 NOTE — Telephone Encounter (Signed)
Me too, she is upset at me for not getting approval for a total knee replacement which is what she states she needs. She states we got approval for arthroscopy but she needs a total. har

## 2019-12-11 NOTE — Telephone Encounter (Signed)
Ms. Gonalez called and said that she spoke to [facility] today regarding getting authorization for a total knee replacement.  She said they told her at [facility] that nothing has been submitted to them for a total knee replacement.  She said she wanted to speak to someone who handles the authorizations and she wants to know why this has not been done.  Please call her  Thanks

## 2019-12-12 NOTE — Telephone Encounter (Signed)
Patient has left a follow up voice message this morning, 12/12/19, asking to speak with Dr Aline Brochure; asking if Dr Aline Brochure is going to continue to treat her, and as per previous note, asking why it was not submitted for her to have the total knee surgery?

## 2019-12-12 NOTE — Telephone Encounter (Signed)
Patient has called again, she is very upset she had knee scope when she needed a knee replacement.

## 2019-12-13 ENCOUNTER — Encounter: Payer: Self-pay | Admitting: Radiology

## 2019-12-13 NOTE — Telephone Encounter (Signed)
I have advised Cathy Thomas and she will advise patient.

## 2019-12-13 NOTE — Telephone Encounter (Signed)
Tell Abigail Butts to call her and tell her I l be calling her after this surgery  (stay out of it since shes upset)

## 2019-12-13 NOTE — Progress Notes (Signed)
I called Cathy Thomas and advised her that Dr Aline Brochure will be calling her this afternoon to discuss her surgery and answer her questions.

## 2019-12-22 ENCOUNTER — Telehealth: Payer: Self-pay | Admitting: Orthopedic Surgery

## 2019-12-22 ENCOUNTER — Other Ambulatory Visit: Payer: Self-pay | Admitting: Orthopedic Surgery

## 2019-12-22 MED ORDER — HYDROCODONE-ACETAMINOPHEN 7.5-325 MG PO TABS: 1.0000 | ORAL_TABLET | Freq: Four times a day (QID) | ORAL | 0 refills | Status: AC | PRN

## 2019-12-22 NOTE — Telephone Encounter (Signed)
Called Cathy Thomas today.  She is in a lot of pain.  She is also upset that her surgery was submitted as a knee scope versus a knee replacement  I was under the impression that [employer] would not cover knee replacement with a BMI over 40.  I wrote that in a note on March 22.  I explained this to [place].  Surmising that there must have been a miscommunication regarding that approval  We did do an arthroscopy it did not help her  She is wanting to have note sent for second opinion which I agree with and told her so  I apologized for the miscommunication I wrote her some medication for pain I advised her to come by and sign for her medical records to be released and to get the second opinion and then go from there she expressed that she would like to stay with [place] and I expressed that we would like to keep her with [place] as well  We also expressed that we would like to minimize any complications and optimize her surgery by not operating in the setting of a high BMI but if the situation requires we will try to prevent any complications and manage them if we went ahead with the surgery   No orders of the defined types were placed in this encounter.

## 2019-12-22 NOTE — Telephone Encounter (Signed)
Did you speak with patient per previous notes?  I have not called her back yet today.

## 2019-12-22 NOTE — Telephone Encounter (Signed)
Call received from patient asking to speak with an office manager; states she did not receive a call back from Dr Aline Brochure as had been advised. I relayed to patient that our office supervisor Abigail Butts was not available at the moment, and asked if she would like to hold. She then became increasingly upset, said that evidently Dr Aline Brochure does not want to treat her any longer, and she said she wants her records released. I relayed to patient that we have received the request for the release of records, and that it is in processing at this time.  I asked patient if she would like to hold for supervisor, and she hung up.

## 2019-12-27 ENCOUNTER — Ambulatory Visit: Payer: Medicare PPO | Admitting: Orthopedic Surgery

## 2020-01-29 ENCOUNTER — Other Ambulatory Visit: Payer: Self-pay | Admitting: Orthopedic Surgery

## 2020-01-29 DIAGNOSIS — G8918 Other acute postprocedural pain: Secondary | ICD-10-CM

## 2020-01-29 DIAGNOSIS — Z9889 Other specified postprocedural states: Secondary | ICD-10-CM

## 2020-02-08 ENCOUNTER — Emergency Department (HOSPITAL_COMMUNITY): Payer: Medicare PPO

## 2020-02-08 ENCOUNTER — Observation Stay (HOSPITAL_COMMUNITY): Payer: Medicare PPO

## 2020-02-08 ENCOUNTER — Other Ambulatory Visit: Payer: Self-pay

## 2020-02-08 ENCOUNTER — Observation Stay (HOSPITAL_COMMUNITY)
Admission: EM | Admit: 2020-02-08 | Discharge: 2020-02-09 | Disposition: A | Discharge status: Home / Self Care | Payer: Medicare PPO | Attending: Family Medicine | Admitting: Family Medicine

## 2020-02-08 DIAGNOSIS — Z20822 Contact with and (suspected) exposure to covid-19: Secondary | ICD-10-CM | POA: Insufficient documentation

## 2020-02-08 DIAGNOSIS — R29818 Other symptoms and signs involving the nervous system: Secondary | ICD-10-CM | POA: Diagnosis not present

## 2020-02-08 DIAGNOSIS — R202 Paresthesia of skin: Secondary | ICD-10-CM | POA: Diagnosis not present

## 2020-02-08 DIAGNOSIS — E785 Hyperlipidemia, unspecified: Secondary | ICD-10-CM | POA: Diagnosis present

## 2020-02-08 DIAGNOSIS — M542 Cervicalgia: Secondary | ICD-10-CM | POA: Diagnosis not present

## 2020-02-08 DIAGNOSIS — E782 Mixed hyperlipidemia: Secondary | ICD-10-CM

## 2020-02-08 DIAGNOSIS — I10 Essential (primary) hypertension: Secondary | ICD-10-CM | POA: Diagnosis not present

## 2020-02-08 DIAGNOSIS — Z7982 Long term (current) use of aspirin: Secondary | ICD-10-CM | POA: Diagnosis not present

## 2020-02-08 DIAGNOSIS — R42 Dizziness and giddiness: Secondary | ICD-10-CM | POA: Diagnosis not present

## 2020-02-08 DIAGNOSIS — H538 Other visual disturbances: Secondary | ICD-10-CM | POA: Diagnosis not present

## 2020-02-08 DIAGNOSIS — I739 Peripheral vascular disease, unspecified: Secondary | ICD-10-CM

## 2020-02-08 DIAGNOSIS — R410 Disorientation, unspecified: Secondary | ICD-10-CM | POA: Diagnosis present

## 2020-02-08 DIAGNOSIS — Z79899 Other long term (current) drug therapy: Secondary | ICD-10-CM | POA: Insufficient documentation

## 2020-02-08 DIAGNOSIS — I639 Cerebral infarction, unspecified: Secondary | ICD-10-CM | POA: Diagnosis not present

## 2020-02-08 DIAGNOSIS — M797 Fibromyalgia: Secondary | ICD-10-CM

## 2020-02-08 DIAGNOSIS — R200 Anesthesia of skin: Secondary | ICD-10-CM

## 2020-02-08 LAB — DIFFERENTIAL
Abs Immature Granulocytes: 0.02 10*3/uL (ref 0.00–0.07)
Basophils Absolute: 0 10*3/uL (ref 0.0–0.1)
Basophils Relative: 0 %
Eosinophils Absolute: 0.1 10*3/uL (ref 0.0–0.5)
Eosinophils Relative: 2 %
Immature Granulocytes: 0 %
Lymphocytes Relative: 20 %
Lymphs Abs: 1.9 10*3/uL (ref 0.7–4.0)
Monocytes Absolute: 0.7 10*3/uL (ref 0.1–1.0)
Monocytes Relative: 7 %
Neutro Abs: 6.8 10*3/uL (ref 1.7–7.7)
Neutrophils Relative %: 71 %

## 2020-02-08 LAB — I-STAT CHEM 8, ED
BUN: 18 mg/dL (ref 8–23)
Calcium, Ion: 1.15 mmol/L (ref 1.15–1.40)
Chloride: 106 mmol/L (ref 98–111)
Creatinine, Ser: 1 mg/dL (ref 0.44–1.00)
Glucose, Bld: 83 mg/dL (ref 70–99)
HCT: 36 % (ref 36.0–46.0)
Hemoglobin: 12.2 g/dL (ref 12.0–15.0)
Potassium: 3.7 mmol/L (ref 3.5–5.1)
Sodium: 141 mmol/L (ref 135–145)
TCO2: 27 mmol/L (ref 22–32)

## 2020-02-08 LAB — COMPREHENSIVE METABOLIC PANEL
ALT: 23 U/L (ref 0–44)
AST: 19 U/L (ref 15–41)
Albumin: 3.8 g/dL (ref 3.5–5.0)
Alkaline Phosphatase: 88 U/L (ref 38–126)
Anion gap: 11 (ref 5–15)
BUN: 18 mg/dL (ref 8–23)
CO2: 25 mmol/L (ref 22–32)
Calcium: 9.2 mg/dL (ref 8.9–10.3)
Chloride: 104 mmol/L (ref 98–111)
Creatinine, Ser: 0.92 mg/dL (ref 0.44–1.00)
GFR calc Af Amer: >60 mL/min (ref >60)
GFR calc non Af Amer: >60 mL/min (ref >60)
Glucose, Bld: 84 mg/dL (ref 70–99)
Potassium: 3.8 mmol/L (ref 3.5–5.1)
Sodium: 140 mmol/L (ref 135–145)
Total Bilirubin: 0.9 mg/dL (ref 0.3–1.2)
Total Protein: 7.8 g/dL (ref 6.5–8.1)

## 2020-02-08 LAB — URINALYSIS, ROUTINE W REFLEX MICROSCOPIC
Bilirubin Urine: NEGATIVE
Glucose, UA: NEGATIVE mg/dL
Hgb urine dipstick: NEGATIVE
Ketones, ur: NEGATIVE mg/dL
Leukocytes,Ua: NEGATIVE
Nitrite: NEGATIVE
Protein, ur: NEGATIVE mg/dL
Specific Gravity, Urine: 1.033 — ABNORMAL HIGH (ref 1.005–1.030)
pH: 6 (ref 5.0–8.0)

## 2020-02-08 LAB — CBC
HCT: 37.7 % (ref 36.0–46.0)
Hemoglobin: 11.7 g/dL — ABNORMAL LOW (ref 12.0–15.0)
MCH: 25.4 pg — ABNORMAL LOW (ref 26.0–34.0)
MCHC: 31 g/dL (ref 30.0–36.0)
MCV: 81.8 fL (ref 80.0–100.0)
Platelets: 317 10*3/uL (ref 150–400)
RBC: 4.61 MIL/uL (ref 3.87–5.11)
RDW: 15.3 % (ref 11.5–15.5)
WBC: 9.5 10*3/uL (ref 4.0–10.5)
nRBC: 0 % (ref 0.0–0.2)

## 2020-02-08 LAB — RAPID URINE DRUG SCREEN, HOSP PERFORMED
Amphetamines: NOT DETECTED
Barbiturates: NOT DETECTED
Benzodiazepines: NOT DETECTED
Cocaine: NOT DETECTED
Opiates: NOT DETECTED
Tetrahydrocannabinol: NOT DETECTED

## 2020-02-08 LAB — APTT: aPTT: 28 seconds (ref 24–36)

## 2020-02-08 LAB — SARS CORONAVIRUS 2 BY RT PCR (HOSPITAL ORDER, PERFORMED IN ~~LOC~~ HOSPITAL LAB): SARS Coronavirus 2: NEGATIVE

## 2020-02-08 LAB — PROTIME-INR
INR: 1 (ref 0.8–1.2)
Prothrombin Time: 12.9 seconds (ref 11.4–15.2)

## 2020-02-08 LAB — ETHANOL: Alcohol, Ethyl (B): <10 mg/dL (ref <10)

## 2020-02-08 MED ORDER — SODIUM CHLORIDE 0.9 % IV SOLN: INTRAVENOUS | Status: DC

## 2020-02-08 MED ORDER — ASPIRIN 81 MG PO CHEW
81.0000 mg | CHEWABLE_TABLET | Freq: Once | ORAL | Status: AC
  Administered 2020-02-08: 81 mg via ORAL
  Filled 2020-02-08: qty 1

## 2020-02-08 MED ORDER — ACETAMINOPHEN 650 MG RE SUPP: 650.0000 mg | RECTAL | Status: DC | PRN

## 2020-02-08 MED ORDER — ACETAMINOPHEN 160 MG/5ML PO SOLN: 650.0000 mg | ORAL | Status: DC | PRN

## 2020-02-08 MED ORDER — PROMETHAZINE HCL 12.5 MG PO TABS: 12.5000 mg | ORAL_TABLET | Freq: Four times a day (QID) | ORAL | Status: DC | PRN

## 2020-02-08 MED ORDER — ATORVASTATIN CALCIUM 20 MG PO TABS
20.0000 mg | ORAL_TABLET | Freq: Every day | ORAL | Status: DC
  Administered 2020-02-08: 20 mg via ORAL
  Filled 2020-02-08: qty 1

## 2020-02-08 MED ORDER — PREGABALIN 75 MG PO CAPS: 75.0000 mg | ORAL_CAPSULE | Freq: Every evening | ORAL | Status: DC | PRN

## 2020-02-08 MED ORDER — MAGNESIUM 500 MG PO CAPS: 1.0000 | ORAL_CAPSULE | Freq: Every day | ORAL | Status: DC

## 2020-02-08 MED ORDER — SENNOSIDES-DOCUSATE SODIUM 8.6-50 MG PO TABS: 1.0000 | ORAL_TABLET | Freq: Every evening | ORAL | Status: DC | PRN

## 2020-02-08 MED ORDER — STROKE: EARLY STAGES OF RECOVERY BOOK: Freq: Once | Status: AC

## 2020-02-08 MED ORDER — ACETAMINOPHEN 325 MG PO TABS
650.0000 mg | ORAL_TABLET | ORAL | Status: DC | PRN
  Administered 2020-02-09: 650 mg via ORAL
  Filled 2020-02-08: qty 2

## 2020-02-08 MED ORDER — ASPIRIN EC 81 MG PO TBEC
81.0000 mg | DELAYED_RELEASE_TABLET | Freq: Every day | ORAL | Status: DC
  Administered 2020-02-08: 81 mg via ORAL
  Filled 2020-02-08: qty 1

## 2020-02-08 MED ORDER — PREGABALIN 75 MG PO CAPS
150.0000 mg | ORAL_CAPSULE | Freq: Every day | ORAL | Status: DC
  Administered 2020-02-09: 150 mg via ORAL
  Filled 2020-02-08: qty 2

## 2020-02-08 MED ORDER — IOHEXOL 350 MG/ML SOLN
75.0000 mL | Freq: Once | INTRAVENOUS | Status: AC | PRN
  Administered 2020-02-08: 75 mL via INTRAVENOUS

## 2020-02-08 MED ORDER — HEPARIN SODIUM (PORCINE) 5000 UNIT/ML IJ SOLN
5000.0000 [IU] | Freq: Three times a day (TID) | INTRAMUSCULAR | Status: DC
  Administered 2020-02-08 – 2020-02-09 (×2): 5000 [IU] via SUBCUTANEOUS
  Filled 2020-02-08 (×2): qty 1

## 2020-02-08 MED ORDER — VITAMIN D (ERGOCALCIFEROL) 1.25 MG (50000 UNIT) PO CAPS
50000.0000 [IU] | ORAL_CAPSULE | ORAL | Status: DC
  Administered 2020-02-09: 50000 [IU] via ORAL
  Filled 2020-02-08: qty 1

## 2020-02-08 MED ORDER — MAGNESIUM OXIDE 400 (241.3 MG) MG PO TABS
400.0000 mg | ORAL_TABLET | Freq: Every day | ORAL | Status: DC
  Administered 2020-02-09: 400 mg via ORAL
  Filled 2020-02-08 (×2): qty 1

## 2020-02-08 NOTE — Consult Note (Addendum)
Addendum 1:  No new or acute large vessel occlusion corresponding to clinical presentation is reported on head and neck CTA, not a candidate for mechanical thrombectomy.   /////    [facility] Consult Services   Date of Service:   02/08/2020 16:20:02  Impression:     .  G94.3 - Encephalopathy in diseases     .  R20.2 - Paresthesia of skin  Comments/Sign-Out: Patient with history of hypertension, Hyperlipidemia/Hypercholesterolemia, TIA, knee surgery 10/24/2019 Presents with confusion, left sided numbness , left neck pain. Head CT: No acute Intracranial hemorrhage. NIHSS 0 Etiology for Symptoms/presentation is concerning for Acute Ischemic Stroke. Symptoms too mild and not disabling, the risks of IV Thrombolysis would outweigh the potential benefits. Symptoms not suggestive of Large Vessel Occlusion. But will get CTA head and neck to rule out aneurysm/dissection. Will be admitted for stroke workup.  Metrics: Last Known Well: 02/08/2020 14:00:00 [facility] Notification Time: 02/08/2020 16:20:02 Arrival Time: 02/08/2020 15:54:00 Stamp Time: 02/08/2020 16:20:02 Time First Login Attempt: 02/08/2020 16:31:52 Symptoms: confusion and numbness NIHSS Start Assessment Time: 02/08/2020 16:34:43 Patient is not a candidate for Thrombolytic. Thrombolytic Medical Decision: 02/08/2020 16:40:00 Patient was not deemed candidate for Thrombolytic because of following reasons: No disabling symptoms.  CT head was reviewed.  ED Physician notified of diagnostic impression and management plan on 02/08/2020 16:43:52  Advanced Imaging: Advanced Imaging Not Recommended because:  Clinical Presentation is not Suggestive of LVO and NIHSS is <6   Our recommendations are outlined below.  Recommendations:     .  Activate Stroke Protocol Admission/Order Set     .  Stroke/Telemetry Floor     .  Neuro Checks     .  Bedside Swallow Eval     .  DVT Prophylaxis      .  IV Fluids, Normal Saline     .  Head of Bed 30 Degrees     .  Euglycemia and Avoid Hyperthermia (PRN Acetaminophen)     .  Antiplatelet Therapy Recommended  Routine Consultation with [facility] for Follow up Care  Sign Out:     .  Discussed with Emergency Department Provider    ------------------------------------------------------------------------------  History of Present Illness: Patient is a 64 year old Female.  Patient was brought by private transportation with symptoms of confusion and numbness  [facility] TeleStroke Consult Note Patient with history of hypertension, Hyperlipidemia/Hypercholesterolemia, TIA, knee surgery 10/24/2019 last known well per patient and Presenting symptoms/Chief complaint/reason for consult: 14:00 Sudden Left neck pain, confusion, blurred vision and left sided numbness. Anticoagulation or Antiplatelet use: Aspirin Chart reviewed for Pertinent medical, surgical, family history. Chart reviewed for Medications list and allergies. Pertinent positive and negative review of systems noted in History of Present Illness.   Past Medical History:     . Hypertension     . Hyperlipidemia   Antiplatelet use: Aspirin    Examination: BP(156/61), Pulse(81), Blood Glucose(83) 1A: Level of Consciousness - Alert; keenly responsive + 0 1B: Ask Month and Age - Both Questions Right + 0 1C: Blink Eyes & Squeeze Hands - Performs Both Tasks + 0 2: Test Horizontal Extraocular Movements - Normal + 0 3: Test Visual Fields - No Visual Loss + 0 4: Test Facial Palsy (Use Grimace if Obtunded) - Normal symmetry + 0 5A: Test Left Arm Motor Drift - No Drift for 10 Seconds + 0 5B: Test Right Arm Motor Drift - No Drift for 10 Seconds + 0 6A: Test Left Leg Motor Drift -  No Drift for 5 Seconds + 0 6B: Test Right Leg Motor Drift - No Drift for 5 Seconds + 0 7: Test Limb Ataxia (FNF/Heel-Shin) - No Ataxia + 0 8: Test Sensation - Mild-Moderate Loss: Less Sharp/More  Dull + 1 9: Test Language/Aphasia - Normal; No aphasia + 0 10: Test Dysarthria - Normal + 0 11: Test Extinction/Inattention - No abnormality + 0  NIHSS Score: 1  Pre-Morbid Modified Rankin Scale: 0 Points = No symptoms at all   Patient/Family was informed the Neurology Consult would occur via TeleHealth consult by way of interactive audio and video telecommunications and consented to receiving care in this manner.   Patient is being evaluated for possible acute neurologic impairment and high probability of imminent or life-threatening deterioration. I spent total of 25 minutes providing care to this patient, including time for face to face visit via telemedicine, review of medical records, imaging studies and discussion of findings with providers, the patient and/or family.   Dr Elenor Quinones   [facility] [phone]  Case [id number]

## 2020-02-08 NOTE — ED Provider Notes (Signed)
[facility] EMERGENCY DEPARTMENT Provider Note   CSN: [id number] Arrival date & time: 02/08/20  1554  An emergency department physician performed an initial assessment on this suspected stroke patient at 64.  History Chief Complaint  Patient presents with  . Code Stroke    Cathy Thomas is a 64 y.o. female with a history of TIA approximately 10 years ago, on aspirin, hypertension, presented to emergency department with blurred vision and disorientation.  Patient reports that she was at work around 2 PM today when she abruptly began to feel blurred vision in her left eye.  She describes paresthesias in the left half of her face, primarily on the cheeks and forehead.  She also reports feeling of disorientation like "I could not remember the date".  While being brought to the hospital, she reports that she developed onset of pain in the left side of her neck at the posterior neck.  This is mild.  She denies any spinal manipulation, chiropractor, massage or neck manipulation.  She denies any numbness or weakness of the extremities.  She has been compliant with her medications.  She does not smoke.  Of note, she reports that 4 days ago she was outside at a picnic on 4 July, and began to feel extremely lightheaded.  She says EMS was called out to the scene and the patient was taken indoors and told that she was having a "heat stroke".  She describes lightheadedness at that time.  However she says she completely recovered from those symptoms in the past 3 days and had been feeling fine otherwise.  HPI     Past Medical History:  Diagnosis Date  . Fibromyalgia   . Headache   . Hypertension   . Kidney stone   . Stroke ([place])   . TIA (transient ischemic attack)     Patient Active Problem List   Diagnosis Date Noted  . Focal neurological deficit 02/08/2020  . S/P left knee arthroscopy 10/24/19 10/30/2019  . Derangement of posterior horn of medial meniscus of left knee   . Lateral  meniscus derangement, left   . Primary osteoarthritis of left knee   . Essential hypertension 02/22/2019  . Hyperlipidemia 02/22/2019  . Bilateral leg pain 02/22/2019  . BV (bacterial vaginosis) 04/24/2015  . Sprain and strain of other specified sites of knee and leg 12/05/2009  . Tendonitis, Achilles 10/08/2008  . Fibromyalgia 07/15/2006  . Plantar fasciitis, bilateral 07/15/2006  . 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 Degeneration of lumbar or lumbosacral intervertebral disc 01/04/2006  . Obesity 01/04/2006  . Osteoarthrosis involving lower leg 01/04/2006  . Sprain of lumbar region 01/04/2006    Past Surgical History:  Procedure Laterality Date  . ABDOMINAL HYSTERECTOMY    . BALLOON DILATION  12/16/2011   Procedure: BALLOON DILATION;  Surgeon: Marissa Nestle, MD;  Location: [facility];  Service: Urology;  Laterality: Left;  . CESAREAN SECTION    . COLONOSCOPY N/A 01/18/2018   Procedure: COLONOSCOPY;  Surgeon: Daneil Dolin, MD;  Location: [facility] SUITE;  Service: Endoscopy;  Laterality: N/A;  10:15  . KNEE ARTHROSCOPY    . KNEE ARTHROSCOPY WITH LATERAL MENISECTOMY Left 10/24/2019   Procedure: KNEE ARTHROSCOPY WITH MEDIAL AND LATERAL MENISECTOMY;  Surgeon: Carole Civil, MD;  Location: [facility];  Service: Orthopedics;  Laterality: Left;  . STONE EXTRACTION WITH BASKET  12/16/2011   Procedure: STONE EXTRACTION WITH BASKET;  Surgeon: Marissa Nestle, MD;  Location: [facility];  Service: Urology;  Laterality: Left;     OB History   No obstetric history on file.     Family History  Problem Relation Age of Onset  . Cancer Mother        ovarian  . Hypertension Mother   . Heart disease Mother   . Diabetes Mother   . Cancer Father        lung, prostate  . Hypertension Father   . Colon cancer Neg Hx     Social History   Tobacco Use  . Smoking status: Never Smoker  . Smokeless tobacco: Never Used  Vaping Use  . Vaping Use: Never used  Substance Use Topics  . Alcohol  use: No  . Drug use: No    Home Medications Prior to Admission medications   Medication Sig Start Date End Date Taking? Authorizing Provider  aspirin EC 81 MG tablet Take 81 mg by mouth at bedtime.    Yes [provider]  atorvastatin (LIPITOR) 20 MG tablet TAKE 1 TABLET BY MOUTH ONCE DAILY. Patient taking differently: Take 20 mg by mouth at bedtime.  09/18/19  Yes Herminio Commons, MD  HYDROcodone-acetaminophen (NORCO/VICODIN) 5-325 MG tablet TAKE (1) TABLET BY MOUTH EVERY SIX HOURS AS NEEDED FOR MODERATE PAIN FOR UP TO 7 DAYS. 01/29/20  Yes Carole Civil, MD  ibuprofen (ADVIL) 800 MG tablet Take 1 tablet (800 mg total) by mouth every 8 (eight) hours as needed. 10/24/19  Yes Carole Civil, MD  losartan (COZAAR) 50 MG tablet Take 50 mg by mouth in the morning and at bedtime.    Yes [provider]  LYRICA 75 MG capsule Take 75-150 mg by mouth See admin instructions. Takes 2 capsules (150 mg)  in the morning & if experiencing severe pain 1 capsule (75 mg) at bedtime. 05/31/14  Yes [provider]  Magnesium 500 MG CAPS Take 1 capsule by mouth daily.   Yes [provider]  OZEMPIC, 0.25 OR 0.5 MG/DOSE, 2 MG/1.5ML SOPN Inject 0.5 mg into the skin once a week.  01/09/20  Yes [provider]  Vitamin D, Ergocalciferol, (DRISDOL) 50000 units CAPS capsule Take 50,000 Units by mouth every Friday.    Yes [provider]  meloxicam (MOBIC) 7.5 MG tablet Take 1 tablet (7.5 mg total) by mouth daily. Patient not taking: Reported on 02/08/2020 11/20/19   Carole Civil, MD  promethazine (PHENERGAN) 12.5 MG tablet Take 1 tablet (12.5 mg total) by mouth every 6 (six) hours as needed for nausea or vomiting. Patient not taking: Reported on 02/08/2020 10/24/19   Carole Civil, MD    Allergies    Amoxicillin and Fluconazole  Review of Systems   Review of Systems  Constitutional: Negative for chills and fever.  HENT: Negative for ear pain  and sore throat.   Eyes: Positive for visual disturbance. Negative for pain.  Respiratory: Negative for cough and shortness of breath.   Cardiovascular: Negative for chest pain and palpitations.  Gastrointestinal: Negative for abdominal pain, nausea and vomiting.  Genitourinary: Negative for dysuria and hematuria.  Musculoskeletal: Positive for neck pain. Negative for arthralgias.  Skin: Negative for color change and rash.  Neurological: Positive for light-headedness and numbness. Negative for dizziness, seizures, syncope, speech difficulty and weakness.  Psychiatric/Behavioral: Negative for agitation and confusion.  All other systems reviewed and are negative.   Physical Exam Updated Vital Signs BP 125/71 (BP Location: Left Arm)   Pulse 74   Temp 97.7 F (36.5 C) (Oral)  Resp 16   Ht 5\' 7"  (1.702 m)   Wt 121 kg   SpO2 98%   BMI 41.79 kg/m   Physical Exam Vitals and nursing note reviewed.  Constitutional:      General: She is not in acute distress.    Appearance: She is well-developed.  HENT:     Head: Normocephalic and atraumatic.  Eyes:     Conjunctiva/sclera: Conjunctivae normal.  Neck:     Comments: Mild ttp of the left posterior neck Cardiovascular:     Rate and Rhythm: Normal rate and regular rhythm.     Pulses: Normal pulses.  Pulmonary:     Effort: Pulmonary effort is normal. No respiratory distress.  Abdominal:     Palpations: Abdomen is soft.     Tenderness: There is no abdominal tenderness.  Musculoskeletal:     Cervical back: Normal range of motion and neck supple.  Skin:    General: Skin is warm and dry.  Neurological:     Mental Status: She is alert.     GCS: GCS eye subscore is 4. GCS verbal subscore is 5. GCS motor subscore is 6.     Cranial Nerves: Cranial nerves are intact.     Motor: Motor function is intact.     Coordination: Coordination is intact. Romberg sign negative. Finger-Nose-Finger Test normal.     Gait: Gait is intact.      Comments: Paresthesias in left maxilla and left forehead Peripheral fields in tact   Psychiatric:        Mood and Affect: Mood normal.        Behavior: Behavior normal.     ED Results / Procedures / Treatments   Labs (all labs ordered are listed, but only abnormal results are displayed) Labs Reviewed  CBC - Abnormal; Notable for the following components:      Result Value   Hemoglobin 11.7 (*)    MCH 25.4 (*)    All other components within normal limits  URINALYSIS, ROUTINE W REFLEX MICROSCOPIC - Abnormal; Notable for the following components:   Color, Urine STRAW (*)    Specific Gravity, Urine 1.033 (*)    All other components within normal limits  SARS CORONAVIRUS 2 BY RT PCR (HOSPITAL ORDER, [place] [facility])  ETHANOL  PROTIME-INR  APTT  DIFFERENTIAL  COMPREHENSIVE METABOLIC PANEL  RAPID URINE DRUG SCREEN, HOSP PERFORMED  HIV ANTIBODY (ROUTINE TESTING W REFLEX)  HEMOGLOBIN A1C  LIPID PANEL  I-STAT CHEM 8, ED    EKG EKG Interpretation  Date/Time:  Thursday February 08 2020 16:31:08 EDT Ventricular Rate:  79 PR Interval:    QRS Duration: 89 QT Interval:  381 QTC Calculation: 437 R Axis:   60 Text Interpretation: Sinus rhythm Abnormal R-wave progression, early transition No STEMI Confirmed by Octaviano Glow [phone]) on 02/08/2020 4:37:29 PM   Radiology MR BRAIN WO CONTRAST  Result Date: 02/08/2020 CLINICAL DATA:  Blurry vision, left-sided weakness EXAM: MRI HEAD WITHOUT CONTRAST TECHNIQUE: Multiplanar, multiecho pulse sequences of the brain and surrounding structures were obtained without intravenous contrast. COMPARISON:  2017 FINDINGS: Brain: There is no acute infarction or intracranial hemorrhage. There is no intracranial mass, mass effect, or edema. There is no hydrocephalus or extra-axial fluid collection. Ventricles and sulci are within normal limits in size and configuration. Vascular: Major vessel flow voids at the skull base are preserved.  Skull and upper cervical spine: Normal marrow signal is preserved. Sinuses/Orbits: Paranasal sinuses are aerated. Orbits are unremarkable. Other:  Sella is unremarkable.  Mastoid air cells are clear. IMPRESSION: No evidence of recent infarction, hemorrhage, or mass. Electronically Signed   By: Macy Mis M.D.   On: 02/08/2020 19:07   CT HEAD CODE STROKE WO CONTRAST  Result Date: 02/08/2020 CLINICAL DATA:  Code stroke.  Left eye blurry vision EXAM: CT HEAD WITHOUT CONTRAST TECHNIQUE: Contiguous axial images were obtained from the base of the skull through the vertex without intravenous contrast. COMPARISON:  2018 FINDINGS: Brain: There is no acute intracranial hemorrhage, mass effect or edema. Gray-white differentiation is preserved. Ventricles and sulci are normal in size and configuration. There is no extra-axial fluid collection. Vascular: No hyperdense vessel. Skull: Unremarkable. Sinuses/Orbits: No acute abnormality. Other: Mastoid air cells are clear. ASPECTS ([place] Stroke Program Early CT Score) - Ganglionic level infarction (caudate, lentiform nuclei, internal capsule, insula, M1-M3 cortex): 7 - Supraganglionic infarction (M4-M6 cortex): 3 Total score (0-10 with 10 being normal): 10 IMPRESSION: No acute intracranial hemorrhage or evidence of acute infarction. ASPECT score is 10. Electronically Signed   By: Macy Mis M.D.   On: 02/08/2020 16:34   CT ANGIO HEAD CODE STROKE  Result Date: 02/08/2020 CLINICAL DATA:  Left-sided weakness and blurry vision EXAM: CT ANGIOGRAPHY HEAD AND NECK TECHNIQUE: Multidetector CT imaging of the head and neck was performed using the standard protocol during bolus administration of intravenous contrast. Multiplanar CT image reconstructions and MIPs were obtained to evaluate the vascular anatomy. Carotid stenosis measurements (when applicable) are obtained utilizing NASCET criteria, using the distal internal carotid diameter as the denominator. CONTRAST:  6mL  OMNIPAQUE IOHEXOL 350 MG/ML SOLN COMPARISON:  None. FINDINGS: CTA NECK Aortic arch: Great vessel origins are patent Right carotid system: Patent. Minimal calcified plaque at the ICA origin without measurable stenosis. There is partially retropharyngeal course. Left carotid system: Patent with partially retropharyngeal course. No measurable stenosis at the ICA origin. Vertebral arteries: Patent. Left vertebral artery is slightly dominant Skeleton: Mild degenerative changes of the cervical spine. Other neck: No mass or adenopathy. Upper chest: No apical lung mass. Review of the MIP images confirms the above findings CTA HEAD Anterior circulation: Intracranial internal carotid arteries are patent. Anterior and middle cerebral arteries are patent. Posterior circulation: Intracranial vertebral arteries, basilar artery, and posterior cerebral arteries are patent. Venous sinuses: Patent as allowed by contrast bolus timing. Review of the MIP images confirms the above findings IMPRESSION: No large vessel occlusion or hemodynamically significant stenosis. Electronically Signed   By: Macy Mis M.D.   On: 02/08/2020 17:27   CT ANGIO NECK CODE STROKE  Result Date: 02/08/2020 CLINICAL DATA:  Left-sided weakness and blurry vision EXAM: CT ANGIOGRAPHY HEAD AND NECK TECHNIQUE: Multidetector CT imaging of the head and neck was performed using the standard protocol during bolus administration of intravenous contrast. Multiplanar CT image reconstructions and MIPs were obtained to evaluate the vascular anatomy. Carotid stenosis measurements (when applicable) are obtained utilizing NASCET criteria, using the distal internal carotid diameter as the denominator. CONTRAST:  55mL OMNIPAQUE IOHEXOL 350 MG/ML SOLN COMPARISON:  None. FINDINGS: CTA NECK Aortic arch: Great vessel origins are patent Right carotid system: Patent. Minimal calcified plaque at the ICA origin without measurable stenosis. There is partially retropharyngeal  course. Left carotid system: Patent with partially retropharyngeal course. No measurable stenosis at the ICA origin. Vertebral arteries: Patent. Left vertebral artery is slightly dominant Skeleton: Mild degenerative changes of the cervical spine. Other neck: No mass or adenopathy. Upper chest: No apical lung mass. Review of the MIP images  confirms the above findings CTA HEAD Anterior circulation: Intracranial internal carotid arteries are patent. Anterior and middle cerebral arteries are patent. Posterior circulation: Intracranial vertebral arteries, basilar artery, and posterior cerebral arteries are patent. Venous sinuses: Patent as allowed by contrast bolus timing. Review of the MIP images confirms the above findings IMPRESSION: No large vessel occlusion or hemodynamically significant stenosis. Electronically Signed   By: Macy Mis M.D.   On: 02/08/2020 17:27    Procedures Procedures (including critical care time)  Medications Ordered in ED Medications  aspirin EC tablet 81 mg (81 mg Oral Given 02/08/20 2149)  atorvastatin (LIPITOR) tablet 20 mg (20 mg Oral Given 02/08/20 2145)  pregabalin (LYRICA) capsule 150 mg (has no administration in time range)  Vitamin D (Ergocalciferol) (DRISDOL) capsule 50,000 Units (has no administration in time range)  promethazine (PHENERGAN) tablet 12.5 mg (has no administration in time range)  0.9 %  sodium chloride infusion ( Intravenous New Bag/Given 02/08/20 2154)  acetaminophen (TYLENOL) tablet 650 mg (has no administration in time range)    Or  acetaminophen (TYLENOL) 160 MG/5ML solution 650 mg (has no administration in time range)    Or  acetaminophen (TYLENOL) suppository 650 mg (has no administration in time range)  senna-docusate (Senokot-S) tablet 1 tablet (has no administration in time range)  heparin injection 5,000 Units (5,000 Units Subcutaneous Given 02/08/20 2144)  magnesium oxide (MAG-OX) tablet 400 mg (has no administration in time range)    pregabalin (LYRICA) capsule 75 mg (has no administration in time range)  iohexol (OMNIPAQUE) 350 MG/ML injection 75 mL (75 mLs Intravenous Contrast Given 02/08/20 1709)  aspirin chewable tablet 81 mg (81 mg Oral Given 02/08/20 1853)   stroke: mapping our early stages of recovery book ( Does not apply Given 02/08/20 2144)    ED Course  I have reviewed the triage vital signs and the nursing notes.  Pertinent labs & imaging results that were available during my care of the patient were reviewed by me and considered in my medical decision making (see chart for details).  56-year female present emergency department with left-sided facial paresthesias and blurred vision in her left eye, onset around 2 PM today.  She also describes a sense of disorientation when this happened, which has resolved.  She reports some discomfort in her left neck which developed after symptoms began.  No history of spinal manipulation or neck trauma recently to suggest traumatic injury to the posterior circulation.  Differential does include Bell's palsy.  She has no objective facial droop notable on exam, no evidence of forehead muscle involvement or extraocular muscle involvement.  There is no ptosis.  Her vision appears to be intact.  However given because this not a clear presentation of Bell's Palsy, and given that she is describing as well as symptom of disorientation, I felt it was prudent to proceed with stroke evaluation.  Stroke evaluation, neuro consult, CTH pending.  Clinical Course as of Feb 08 149  Thu Feb 08, 2020  1705 CT scan of the head is negative.  I spoke to the teleneurologist, who believes that this presentation may be consistent with a small stroke or TIA.  We agreed for a CTA given the patient's description of neck pain, which the neurologist had not been aware of.  If negative, I anticipate admission for stroke/TIA evaluation.  Patient made aware of plans.   [MT]  8502  IMPRESSION: No large vessel  occlusion or hemodynamically significant stenosis.     [MT]  [id number]  CTA negative, no new neuro symptoms, will admit to hospitalist for stroke/tia evaluation.   [MT]  1800 Admitted to hospitalist   [MT]    Clinical Course User Index [MT] Esco Joslyn, Carola Rhine, MD    Final Clinical Impression(s) / ED Diagnoses Final diagnoses:  Blurred vision, left eye  Left facial numbness  Neck pain on left side    Rx / DC Orders ED Discharge Orders    None       Langston Masker Carola Rhine, MD 02/09/20 0150

## 2020-02-08 NOTE — ED Triage Notes (Signed)
Pt reports onset of blurry vision, left sided weakness and left sided neck pain starting todat at 1400, reports hx of TIA

## 2020-02-08 NOTE — H&P (Signed)
TR[facility] H&P   Patient Demographics:    Cathy Thomas, is a 64 y.o. female  MRN: [id number]   DOB - August 01, 1956  Admit Date - 02/08/2020  Outpatient Primary MD for the patient is Renee Rival, NP  Referring MD/NP/PA: Dr Langston Masker  Patient coming from: Home  Chief Complaint  Patient presents with  . Code Stroke      HPI:    Cathy Thomas  is a 64 y.o. female, history of hypertension, hyperlipidemia, knee arthroscopic surgery 10/24/2019, fibromyalgia, possible history of TIA in 2011, patient presents to ED secondary to episode of confusion, left side tingling and numbness, and left neck pain, ports her symptoms started today at 2 PM, all of a sudden, she reports an episode of confusion, blurred vision, left side tingling and numbness, mainly in the cheeks, upon coming to the hospital she did feel some sudden left neck pain as well, and upon my evaluation she does report left upper extremity tingling and numbness, beside her altered sensation she denies any focal deficits, slurred speech, mentation currently back to baseline, she denies any fever, chills, chest pain or shortness of breath. - in ED CT head with no acute findings, CTA head and neck was obtained given her neck pain to rule out dissection, there is no evidence of dissection, or large vessel occlusion, teleneurology recommended admission for ischemic stroke work-up, patient reports she already took her aspirin this morning, [facility] hospitalist consulted to admit for further evaluation.    Review of systems:    In addition to the HPI above,  No Fever-chills, No Headache, No changes  hearing, No problems swallowing food or Liquids, No Chest pain, Cough or Shortness of Breath, No Abdominal pain, No Nausea or Vommitting, Bowel movements are regular, No Blood in stool or Urine, No dysuria, No new skin rashes or bruises, No new  joints pains-aches,  She does report episodes of confusion, skin tingling and numbness in left face and arm, and blurry vision. No recent weight gain or loss, No polyuria, polydypsia or polyphagia, No significant Mental Stressors.  A full 10 point Review of Systems was done, except as stated above, all other Review of Systems were negative.   With Past History of the following :    Past Medical History:  Diagnosis Date  . Fibromyalgia   . Headache   . Hypertension   . Kidney stone   . Stroke ([place])   . TIA (transient ischemic attack)       Past Surgical History:  Procedure Laterality Date  . ABDOMINAL HYSTERECTOMY    . BALLOON DILATION  12/16/2011   Procedure: BALLOON DILATION;  Surgeon: Marissa Nestle, MD;  Location: [facility];  Service: Urology;  Laterality: Left;  . CESAREAN SECTION    . COLONOSCOPY N/A 01/18/2018   Procedure: COLONOSCOPY;  Surgeon: Daneil Dolin, MD;  Location: [facility];  Service:  Endoscopy;  Laterality: N/A;  10:15  . KNEE ARTHROSCOPY    . KNEE ARTHROSCOPY WITH LATERAL MENISECTOMY Left 10/24/2019   Procedure: KNEE ARTHROSCOPY WITH MEDIAL AND LATERAL MENISECTOMY;  Surgeon: Carole Civil, MD;  Location: [facility];  Service: Orthopedics;  Laterality: Left;  . STONE EXTRACTION WITH BASKET  12/16/2011   Procedure: STONE EXTRACTION WITH BASKET;  Surgeon: Marissa Nestle, MD;  Location: [facility];  Service: Urology;  Laterality: Left;      Social History:     Social History   Tobacco Use  . Smoking status: Never Smoker  . Smokeless tobacco: Never Used  Substance Use Topics  . Alcohol use: No       Family History :     Family History  Problem Relation Age of Onset  . Cancer Mother        ovarian  . Hypertension Mother   . Heart disease Mother   . Diabetes Mother   . Cancer Father        lung, prostate  . Hypertension Father   . Colon cancer Neg Hx       Home Medications:   Prior to Admission medications   Medication Sig Start  Date End Date Taking? Authorizing Provider  aspirin EC 81 MG tablet Take 81 mg by mouth at bedtime.    Yes [provider]  atorvastatin (LIPITOR) 20 MG tablet TAKE 1 TABLET BY MOUTH ONCE DAILY. Patient taking differently: Take 20 mg by mouth at bedtime.  09/18/19  Yes Herminio Commons, MD  HYDROcodone-acetaminophen (NORCO/VICODIN) 5-325 MG tablet TAKE (1) TABLET BY MOUTH EVERY SIX HOURS AS NEEDED FOR MODERATE PAIN FOR UP TO 7 DAYS. 01/29/20  Yes Carole Civil, MD  ibuprofen (ADVIL) 800 MG tablet Take 1 tablet (800 mg total) by mouth every 8 (eight) hours as needed. 10/24/19  Yes Carole Civil, MD  losartan (COZAAR) 50 MG tablet Take 50 mg by mouth in the morning and at bedtime.    Yes [provider]  LYRICA 75 MG capsule Take 75-150 mg by mouth See admin instructions. Takes 2 capsules (150 mg)  in the morning & if experiencing severe pain 1 capsule (75 mg) at bedtime. 05/31/14  Yes [provider]  Magnesium 500 MG CAPS Take 1 capsule by mouth daily.   Yes [provider]  OZEMPIC, 0.25 OR 0.5 MG/DOSE, 2 MG/1.5ML SOPN Inject 0.5 mg into the skin once a week.  01/09/20  Yes [provider]  Vitamin D, Ergocalciferol, (DRISDOL) 50000 units CAPS capsule Take 50,000 Units by mouth every Friday.    Yes [provider]  meloxicam (MOBIC) 7.5 MG tablet Take 1 tablet (7.5 mg total) by mouth daily. 11/20/19   Carole Civil, MD  promethazine (PHENERGAN) 12.5 MG tablet Take 1 tablet (12.5 mg total) by mouth every 6 (six) hours as needed for nausea or vomiting. 10/24/19   Carole Civil, MD     Allergies:     Allergies  Allergen Reactions  . Amoxicillin Itching    Has patient had a PCN reaction causing immediate rash, facial/tongue/throat swelling, SOB or lightheadedness with hypotension: Unknown Has patient had a PCN reaction causing severe rash involving mucus membranes or skin necrosis: Unknown Has patient had a PCN reaction  that required hospitalization: Unknown Has patient had a PCN reaction occurring within the last 10 years: Unknown If all of the above answers are "NO", then may proceed with Cephalosporin use.   Marland Kitchen Fluconazole  Rash    burning sensation,      Physical Exam:   Vitals  Blood pressure (!) 160/126, pulse 77, temperature 98.4 F (36.9 C), temperature source Oral, resp. rate 15, SpO2 100 %.   1. General developed female, laying in bed, in no apparent distress lying in bed in NAD,    2. Normal affect and insight, Not Suicidal or Homicidal, Awake Alert, Oriented X 3.  3. No F.N deficits, ALL C.Nerves Intact, Strength 5/5 all 4 extremities, Sensation intact all 4 extremities, Plantars down going.  Patient had minimally decreased sensation in the left cheek and forehead area.  4. Ears and Eyes appear Normal, Conjunctivae clear, PERRLA. Moist Oral Mucosa.  5. Supple Neck, No JVD, No cervical lymphadenopathy appriciated, No Carotid Bruits.  6. Symmetrical Chest wall movement, Good air movement bilaterally, CTAB.  7. RRR, No Gallops, Rubs or Murmurs, No Parasternal Heave.  8. Positive Bowel Sounds, Abdomen Soft, No tenderness, No organomegaly appriciated,No rebound -guarding or rigidity.  9.  No Cyanosis, Normal Skin Turgor, No Skin Rash or Bruise.  10. Good muscle tone,  joints appear normal , no effusions, Normal ROM.      Data Review:    CBC Recent Labs  Lab 02/08/20 1618 02/08/20 1624  WBC 9.5  --   HGB 11.7* 12.2  HCT 37.7 36.0  PLT 317  --   MCV 81.8  --   MCH 25.4*  --   MCHC 31.0  --   RDW 15.3  --   LYMPHSABS 1.9  --   MONOABS 0.7  --   EOSABS 0.1  --   BASOSABS 0.0  --    ------------------------------------------------------------------------------------------------------------------  Chemistries  Recent Labs  Lab 02/08/20 1618 02/08/20 1624  NA 140 141  K 3.8 3.7  CL 104 106  CO2 25  --   GLUCOSE 84 83  BUN 18 18  CREATININE 0.92 1.00  CALCIUM 9.2   --   AST 19  --   ALT 23  --   ALKPHOS 88  --   BILITOT 0.9  --    ------------------------------------------------------------------------------------------------------------------ CrCl cannot be calculated (Unknown ideal weight.). ------------------------------------------------------------------------------------------------------------------ No results for input(s): TSH, T4TOTAL, T3FREE, THYROIDAB in the last 72 hours.  Invalid input(s): FREET3  Coagulation profile Recent Labs  Lab 02/08/20 1618  INR 1.0   ------------------------------------------------------------------------------------------------------------------- No results for input(s): DDIMER in the last 72 hours. -------------------------------------------------------------------------------------------------------------------  Cardiac Enzymes No results for input(s): CKMB, TROPONINI, MYOGLOBIN in the last 168 hours.  Invalid input(s): CK ------------------------------------------------------------------------------------------------------------------    Component Value Date/Time   BNP 66.0 01/07/2018 1642     ---------------------------------------------------------------------------------------------------------------  Urinalysis    Component Value Date/Time   COLORURINE YELLOW 05/22/2018 1721   APPEARANCEUR HAZY (A) 05/22/2018 1721   LABSPEC 1.026 05/22/2018 1721   PHURINE 5.0 05/22/2018 1721   GLUCOSEU NEGATIVE 05/22/2018 1721   HGBUR NEGATIVE 05/22/2018 1721   BILIRUBINUR NEGATIVE 05/22/2018 1721   KETONESUR NEGATIVE 05/22/2018 1721   PROTEINUR NEGATIVE 05/22/2018 1721   UROBILINOGEN 0.2 06/27/2013 2246   NITRITE NEGATIVE 05/22/2018 1721   LEUKOCYTESUR NEGATIVE 05/22/2018 1721    ----------------------------------------------------------------------------------------------------------------   Imaging Results:    CT HEAD CODE STROKE WO CONTRAST  Result Date: 02/08/2020 CLINICAL DATA:  Code  stroke.  Left eye blurry vision EXAM: CT HEAD WITHOUT CONTRAST TECHNIQUE: Contiguous axial images were obtained from the base of the skull through the vertex without intravenous contrast. COMPARISON:  2018 FINDINGS: Brain: There is no acute intracranial hemorrhage, mass effect or  edema. Gray-white differentiation is preserved. Ventricles and sulci are normal in size and configuration. There is no extra-axial fluid collection. Vascular: No hyperdense vessel. Skull: Unremarkable. Sinuses/Orbits: No acute abnormality. Other: Mastoid air cells are clear. ASPECTS ([place] Stroke Program Early CT Score) - Ganglionic level infarction (caudate, lentiform nuclei, internal capsule, insula, M1-M3 cortex): 7 - Supraganglionic infarction (M4-M6 cortex): 3 Total score (0-10 with 10 being normal): 10 IMPRESSION: No acute intracranial hemorrhage or evidence of acute infarction. ASPECT score is 10. Electronically Signed   By: Macy Mis M.D.   On: 02/08/2020 16:34   CT ANGIO HEAD CODE STROKE  Result Date: 02/08/2020 CLINICAL DATA:  Left-sided weakness and blurry vision EXAM: CT ANGIOGRAPHY HEAD AND NECK TECHNIQUE: Multidetector CT imaging of the head and neck was performed using the standard protocol during bolus administration of intravenous contrast. Multiplanar CT image reconstructions and MIPs were obtained to evaluate the vascular anatomy. Carotid stenosis measurements (when applicable) are obtained utilizing NASCET criteria, using the distal internal carotid diameter as the denominator. CONTRAST:  73mL OMNIPAQUE IOHEXOL 350 MG/ML SOLN COMPARISON:  None. FINDINGS: CTA NECK Aortic arch: Great vessel origins are patent Right carotid system: Patent. Minimal calcified plaque at the ICA origin without measurable stenosis. There is partially retropharyngeal course. Left carotid system: Patent with partially retropharyngeal course. No measurable stenosis at the ICA origin. Vertebral arteries: Patent. Left vertebral artery is  slightly dominant Skeleton: Mild degenerative changes of the cervical spine. Other neck: No mass or adenopathy. Upper chest: No apical lung mass. Review of the MIP images confirms the above findings CTA HEAD Anterior circulation: Intracranial internal carotid arteries are patent. Anterior and middle cerebral arteries are patent. Posterior circulation: Intracranial vertebral arteries, basilar artery, and posterior cerebral arteries are patent. Venous sinuses: Patent as allowed by contrast bolus timing. Review of the MIP images confirms the above findings IMPRESSION: No large vessel occlusion or hemodynamically significant stenosis. Electronically Signed   By: Macy Mis M.D.   On: 02/08/2020 17:27   CT ANGIO NECK CODE STROKE  Result Date: 02/08/2020 CLINICAL DATA:  Left-sided weakness and blurry vision EXAM: CT ANGIOGRAPHY HEAD AND NECK TECHNIQUE: Multidetector CT imaging of the head and neck was performed using the standard protocol during bolus administration of intravenous contrast. Multiplanar CT image reconstructions and MIPs were obtained to evaluate the vascular anatomy. Carotid stenosis measurements (when applicable) are obtained utilizing NASCET criteria, using the distal internal carotid diameter as the denominator. CONTRAST:  50mL OMNIPAQUE IOHEXOL 350 MG/ML SOLN COMPARISON:  None. FINDINGS: CTA NECK Aortic arch: Great vessel origins are patent Right carotid system: Patent. Minimal calcified plaque at the ICA origin without measurable stenosis. There is partially retropharyngeal course. Left carotid system: Patent with partially retropharyngeal course. No measurable stenosis at the ICA origin. Vertebral arteries: Patent. Left vertebral artery is slightly dominant Skeleton: Mild degenerative changes of the cervical spine. Other neck: No mass or adenopathy. Upper chest: No apical lung mass. Review of the MIP images confirms the above findings CTA HEAD Anterior circulation: Intracranial internal  carotid arteries are patent. Anterior and middle cerebral arteries are patent. Posterior circulation: Intracranial vertebral arteries, basilar artery, and posterior cerebral arteries are patent. Venous sinuses: Patent as allowed by contrast bolus timing. Review of the MIP images confirms the above findings IMPRESSION: No large vessel occlusion or hemodynamically significant stenosis. Electronically Signed   By: Macy Mis M.D.   On: 02/08/2020 17:27    My personal review of EKG: Rhythm NSR, Rate  79 /min, QTc 437  Assessment & Plan:    Active Problems:   Essential hypertension   Hyperlipidemia   Fibromyalgia   Focal neurological deficit    Focal neurological deficits -Patient presents with left facial tingling numbness, blurry vision, and currently reports left arm tingling and numbness, and episode of altered mental status. -She will be admitted for ischemic stroke work-up, CT head with no acute findings, CTA head and neck with no evidence of new or acute large vessel occlusion, so we will proceed with MRI brain, will obtain 2D echo, will continue to monitor on telemetry, will check A1c, will check lipid panel as well. -PT/OT/SLP. -Continue with aspirin, she already received her dose this morning. -Will allow for permissive hypertension. -Patient reports similar presentation 10 years ago, by reviewing [facility] records(neurology note in care everywhere  On  06/19/2010 by Dr. Salena Saner) , appears her work-up has been negative there as well.  Hypertension -Hold losartan and allow for permissive hypertension.  Hyperlipidemia -We will check lipid panel, continue with statin  Fibromyalgia -Continue with Lyrica    DVT Prophylaxis Heparin   AM Labs Ordered, also please review Full Orders  Family Communication: Admission, patients condition and plan of care including tests being ordered have been discussed with the patient and daughter-in-law who indicate understanding and agree with  the plan and Code Status.  Code Status Full  Likely DC to  Home  Condition GUARDED    Consults called: seen by Tele Neuro in ED   Admission status:  Observation  Time spent in minutes : 89 minutes   Phillips Climes M.D on 02/08/2020 at 6:25 PM   [facility] - Office  [phone]

## 2020-02-08 NOTE — ED Notes (Signed)
PT transported to CT at this time by RN.

## 2020-02-08 NOTE — Progress Notes (Signed)
Pt stated she is unsure if she took her BP medication Losartan two times today as she was confused and took another dose

## 2020-02-08 NOTE — ED Notes (Signed)
PT brought back from CT by RN at this time to ED room 3.

## 2020-02-08 NOTE — ED Notes (Signed)
Labs drawn by RN with IV insertion at this time and walked over to lab. Lab personnel made aware pt's blood needs to be ran STAT due to code stroke.

## 2020-02-08 NOTE — Progress Notes (Signed)
Advised pt of Q2 hour vital signs. Provided a stroke education booklet, and pt stated she would read. Pt had questions about lifestyle changes and risk factors.

## 2020-02-08 NOTE — Progress Notes (Signed)
Code Stroke Time Documentation   5465 Call Time  Beeper Time 1623 Exam Started  0354 Exam Finished 6568 Images sent to [facility] 1625 Exam completed in [place] radiology called

## 2020-02-09 ENCOUNTER — Observation Stay (HOSPITAL_BASED_OUTPATIENT_CLINIC_OR_DEPARTMENT_OTHER): Payer: Medicare PPO

## 2020-02-09 ENCOUNTER — Encounter (HOSPITAL_COMMUNITY): Payer: Self-pay | Admitting: Internal Medicine

## 2020-02-09 DIAGNOSIS — R29818 Other symptoms and signs involving the nervous system: Secondary | ICD-10-CM | POA: Diagnosis not present

## 2020-02-09 DIAGNOSIS — M797 Fibromyalgia: Secondary | ICD-10-CM | POA: Diagnosis not present

## 2020-02-09 DIAGNOSIS — G459 Transient cerebral ischemic attack, unspecified: Secondary | ICD-10-CM | POA: Diagnosis not present

## 2020-02-09 DIAGNOSIS — I10 Essential (primary) hypertension: Secondary | ICD-10-CM | POA: Diagnosis not present

## 2020-02-09 DIAGNOSIS — E782 Mixed hyperlipidemia: Secondary | ICD-10-CM | POA: Diagnosis not present

## 2020-02-09 LAB — ECHOCARDIOGRAM COMPLETE
Height: 67 in
Weight: 4268.8 oz

## 2020-02-09 LAB — LIPID PANEL
Cholesterol: 114 mg/dL (ref 0–200)
HDL: 51 mg/dL (ref >40)
LDL Cholesterol: 51 mg/dL (ref 0–99)
Total CHOL/HDL Ratio: 2.2 RATIO
Triglycerides: 62 mg/dL (ref <150)
VLDL: 12 mg/dL (ref 0–40)

## 2020-02-09 LAB — HEMOGLOBIN A1C
Hgb A1c MFr Bld: 6 % — ABNORMAL HIGH (ref 4.8–5.6)
Mean Plasma Glucose: 125.5 mg/dL

## 2020-02-09 LAB — HIV ANTIBODY (ROUTINE TESTING W REFLEX): HIV Screen 4th Generation wRfx: NONREACTIVE

## 2020-02-09 MED ORDER — ASPIRIN EC 81 MG PO TBEC: 81.0000 mg | DELAYED_RELEASE_TABLET | Freq: Every day | ORAL | 2 refills | Status: DC

## 2020-02-09 MED ORDER — ACETAMINOPHEN 325 MG PO TABS: 650.0000 mg | ORAL_TABLET | ORAL | 2 refills | Status: AC | PRN

## 2020-02-09 MED ORDER — ASPIRIN EC 81 MG PO TBEC: 81.0000 mg | DELAYED_RELEASE_TABLET | Freq: Every day | ORAL | 2 refills | Status: AC

## 2020-02-09 MED ORDER — CLOPIDOGREL BISULFATE 75 MG PO TABS
75.0000 mg | ORAL_TABLET | Freq: Every day | ORAL | 11 refills | Status: DC
Start: 2020-02-09 — End: 2020-10-17

## 2020-02-09 MED ORDER — ATORVASTATIN CALCIUM 40 MG PO TABS: 40.0000 mg | ORAL_TABLET | Freq: Every day | ORAL | 2 refills | Status: AC

## 2020-02-09 NOTE — Discharge Instructions (Signed)
1)Please take Aspirin 81 mg daily along with Plavix 75 mg daily for 30 days then after that STOP the aspirin and continue ONLY Plavix 75 mg daily indefinitely--for stroke prevention  2)Avoid ibuprofen/Advil/Aleve/Motrin/Goody Powders/Naproxen/BC powders/Meloxicam/Diclofenac/Indomethacin and other Nonsteroidal anti-inflammatory medications as these will make you more likely to bleed and can cause stomach ulcers, can also cause Kidney problems.   3)Please follow-up with Neurologist Dr. Phillips Odor-- Phone: [phone], Address: [address], [place], [place] [address] in 4 to 6 weeks for recheck and reevaluation.  Please call to make appointment with him

## 2020-02-09 NOTE — Discharge Summary (Addendum)
Cathy Thomas, is a 64 y.o. female  DOB March 12, 1956  MRN [id number].  Admission date:  02/08/2020  Admitting Physician  Albertine Patricia, MD  Discharge Date:  02/09/2020   Primary MD  Renee Rival, NP  Recommendations for primary care physician for things to follow:   1)Please take Aspirin 81 mg daily along with Plavix 75 mg daily for 30 days then after that STOP the aspirin and continue ONLY Plavix 75 mg daily indefinitely--for stroke prevention  2)Avoid ibuprofen/Advil/Aleve/Motrin/Goody Powders/Naproxen/BC powders/Meloxicam/Diclofenac/Indomethacin and other Nonsteroidal anti-inflammatory medications as these will make you more likely to bleed and can cause stomach ulcers, can also cause Kidney problems.   3)Please follow-up with Neurologist Dr. Phillips Odor-- Phone: [phone], Address: [address], [place], [place] [address] in 4 to 6 weeks for recheck and reevaluation.  Please call to make appointment with him  Admission Diagnosis  Focal neurological deficit [R29.818]   Discharge Diagnosis  Focal neurological deficit [R29.818]    Active Problems:   Essential hypertension   Hyperlipidemia   Fibromyalgia   Focal neurological deficit      Past Medical History:  Diagnosis Date  . Fibromyalgia   . Headache   . Hypertension   . Kidney stone   . Stroke ([place])   . TIA (transient ischemic attack)     Past Surgical History:  Procedure Laterality Date  . ABDOMINAL HYSTERECTOMY    . BALLOON DILATION  12/16/2011   Procedure: BALLOON DILATION;  Surgeon: Marissa Nestle, MD;  Location: [facility];  Service: Urology;  Laterality: Left;  . CESAREAN SECTION    . COLONOSCOPY N/A 01/18/2018   Procedure: COLONOSCOPY;  Surgeon: Daneil Dolin, MD;  Location: [facility];  Service: Endoscopy;  Laterality: N/A;  10:15  . KNEE ARTHROSCOPY    . KNEE ARTHROSCOPY WITH LATERAL MENISECTOMY Left  10/24/2019   Procedure: KNEE ARTHROSCOPY WITH MEDIAL AND LATERAL MENISECTOMY;  Surgeon: Carole Civil, MD;  Location: [facility];  Service: Orthopedics;  Laterality: Left;  . STONE EXTRACTION WITH BASKET  12/16/2011   Procedure: STONE EXTRACTION WITH BASKET;  Surgeon: Marissa Nestle, MD;  Location: [facility];  Service: Urology;  Laterality: Left;       HPI  from the history and physical done on the day of admission:     Cathy Thomas  is a 64 y.o. female, history of hypertension, hyperlipidemia, knee arthroscopic surgery 10/24/2019, fibromyalgia, possible history of TIA in 2011, patient presents to ED secondary to episode of confusion, left side tingling and numbness, and left neck pain, ports her symptoms started today at 2 PM, all of a sudden, she reports an episode of confusion, blurred vision, left side tingling and numbness, mainly in the cheeks, upon coming to the hospital she did feel some sudden left neck pain as well, and upon my evaluation she does report left upper extremity tingling and numbness, beside her altered sensation she denies any focal deficits, slurred speech, mentation currently back to baseline, she denies any  fever, chills, chest pain or shortness of breath. - in ED CT head with no acute findings, CTA head and neck was obtained given her neck pain to rule out dissection, there is no evidence of dissection, or large vessel occlusion, teleneurology recommended admission for ischemic stroke work-up, patient reports she already took her aspirin this morning, [facility] hospitalist consulted to admit for further evaluation.   Hospital Course:     1)TiA--- left-sided facial numbness, visual disturbance, left arm numbness and tingling and episodes of confusion has all resolved -MRA brain without contrast without acute stroke -CTA head and neck without LVO, no contrast CT head without acute findings -On telemetry monitored unit no significant arrhythmias -Cardiac echo with EF of 60  to 65%, no regional wall motion normalities, -LDL is 51, HDL is 51, total cholesterol 114 and triglycerides 62 -A1c 6.0, UDS negative -PTA patient was on aspirin 81 mg monotherapy, okay to take Aspirin 81 mg daily along with Plavix 75 mg daily for 30 days then after that STOP the aspirin and continue ONLY Plavix 75 mg daily indefinitely--for stroke prevention -Outpatient follow-up with neurologist Dr. Merlene Laughter advised -Neuro presentation back in November 2011, was evaluated at [facility] neurology service by Dr. Salena Saner (06/19/2010)--neuro work-up at that time was unremarkable   2)Morbid Obesity- -Low calorie diet, portion control and increase physical activity discussed with patient -Body mass index is 41.79 kg/m.  3)HLD--given concerns about TIA, Even if her lipid panel is within desired limits, patient should still take Lipitor/Statin for it's Pleiotropic effects (beyond cholesterol lowering benefits) -Lipitor increased to 40 mg qpm  4)HTN--okay to restart losartan  Discharge Condition: stable  Follow UP   Follow-up Information    Phillips Odor, MD. Schedule an appointment as soon as possible for a visit in 4 week(s).   Specialty: Neurology Why: due to TIA/Mini stroke Contact information: [address] [place] [address] [phone]                Consults obtained - na  Diet and Activity recommendation:  As advised  Discharge Instructions     Discharge Instructions    Call MD for:  difficulty breathing, headache or visual disturbances   Complete by: As directed    Call MD for:  persistant dizziness or light-headedness   Complete by: As directed    Call MD for:  persistant nausea and vomiting   Complete by: As directed    Call MD for:  severe uncontrolled pain   Complete by: As directed    Call MD for:  temperature >100.4   Complete by: As directed    Diet - low sodium heart healthy   Complete by: As directed    Diet Carb Modified    Complete by: As directed    Discharge instructions   Complete by: As directed    1)Please take Aspirin 81 mg daily along with Plavix 75 mg daily for 30 days then after that STOP the aspirin and continue ONLY Plavix 75 mg daily indefinitely--for stroke prevention  2)Avoid ibuprofen/Advil/Aleve/Motrin/Goody Powders/Naproxen/BC powders/Meloxicam/Diclofenac/Indomethacin and other Nonsteroidal anti-inflammatory medications as these will make you more likely to bleed and can cause stomach ulcers, can also cause Kidney problems.   3)Please follow-up with Neurologist Dr. Phillips Odor-- Phone: [phone], Address: [address], [place], [place] [address] in 4 to 6 weeks for recheck and reevaluation.  Please call to make appointment with him   Increase activity slowly   Complete by: As directed  Discharge Medications     Allergies as of 02/09/2020      Reactions   Amoxicillin Itching   Has patient had a PCN reaction causing immediate rash, facial/tongue/throat swelling, SOB or lightheadedness with hypotension: Unknown Has patient had a PCN reaction causing severe rash involving mucus membranes or skin necrosis: Unknown Has patient had a PCN reaction that required hospitalization: Unknown Has patient had a PCN reaction occurring within the last 10 years: Unknown If all of the above answers are "NO", then may proceed with Cephalosporin use.   Fluconazole Rash   burning sensation,       Medication List    STOP taking these medications   ibuprofen 800 MG tablet Commonly known as: ADVIL   meloxicam 7.5 MG tablet Commonly known as: Mobic   promethazine 12.5 MG tablet Commonly known as: PHENERGAN     TAKE these medications   acetaminophen 325 MG tablet Commonly known as: TYLENOL Take 2 tablets (650 mg total) by mouth every 4 (four) hours as needed for mild pain (or temp > 37.5 C (99.5 F)).   aspirin EC 81 MG tablet Take 1 tablet (81 mg total) by mouth daily with  breakfast. Please take Aspirin 81 mg daily along with Plavix 75 mg daily for 30 days then after that STOP the aspirin and continue ONLY Plavix 75 mg daily indefinitely--for stroke prevention What changed:   when to take this  additional instructions   atorvastatin 40 MG tablet Commonly known as: LIPITOR Take 1 tablet (40 mg total) by mouth daily. What changed:   medication strength  how much to take   clopidogrel 75 MG tablet Commonly known as: Plavix Take 1 tablet (75 mg total) by mouth daily. Please take Aspirin 81 mg daily along with Plavix 75 mg daily for 30 days then after that STOP the aspirin and continue ONLY Plavix 75 mg daily indefinitely--for stroke prevention   HYDROcodone-acetaminophen 5-325 MG tablet Commonly known as: NORCO/VICODIN TAKE (1) TABLET BY MOUTH EVERY SIX HOURS AS NEEDED FOR MODERATE PAIN FOR UP TO 7 DAYS.   losartan 50 MG tablet Commonly known as: COZAAR Take 50 mg by mouth in the morning and at bedtime.   Lyrica 75 MG capsule Generic drug: pregabalin Take 75-150 mg by mouth See admin instructions. Takes 2 capsules (150 mg)  in the morning & if experiencing severe pain 1 capsule (75 mg) at bedtime.   Magnesium 500 MG Caps Take 1 capsule by mouth daily.   Ozempic (0.25 or 0.5 MG/DOSE) 2 MG/1.5ML Sopn Generic drug: Semaglutide(0.25 or 0.5MG /DOS) Inject 0.5 mg into the skin once a week.   Vitamin D (Ergocalciferol) 1.25 MG (50000 UNIT) Caps capsule Commonly known as: DRISDOL Take 50,000 Units by mouth every Friday.       Major procedures and Radiology Reports - PLEASE review detailed and final reports for all details, in brief -   MR BRAIN WO CONTRAST  Result Date: 02/08/2020 CLINICAL DATA:  Blurry vision, left-sided weakness EXAM: MRI HEAD WITHOUT CONTRAST TECHNIQUE: Multiplanar, multiecho pulse sequences of the brain and surrounding structures were obtained without intravenous contrast. COMPARISON:  2017 FINDINGS: Brain: There is no acute  infarction or intracranial hemorrhage. There is no intracranial mass, mass effect, or edema. There is no hydrocephalus or extra-axial fluid collection. Ventricles and sulci are within normal limits in size and configuration. Vascular: Major vessel flow voids at the skull base are preserved. Skull and upper cervical spine: Normal marrow signal is preserved. Sinuses/Orbits: Paranasal sinuses are  aerated. Orbits are unremarkable. Other: Sella is unremarkable.  Mastoid air cells are clear. IMPRESSION: No evidence of recent infarction, hemorrhage, or mass. Electronically Signed   By: Macy Mis M.D.   On: 02/08/2020 19:07   ECHOCARDIOGRAM COMPLETE  Result Date: 02/09/2020    ECHOCARDIOGRAM REPORT   Patient Name:   Cathy Thomas Date of Exam: 02/09/2020 Medical Rec #:  [id number]        Height:       67.0 in Accession #:    [id number]       Weight:       266.8 lb Date of Birth:  January 02, 1956         BSA:          2.286 m Patient Age:    64 years         BP:           141/55 mmHg Patient Gender: F                HR:           77 bpm. Exam Location:  [facility] Procedure: 2D Echo Indications:    TIA 435.9 / G45.9  History:        Patient has no prior history of Echocardiogram examinations.                 Risk Factors:Hypertension, Dyslipidemia and Non-Smoker.  Sonographer:    Leavy Cella RDCS (AE) Referring Phys: [id number] [address] [place] IMPRESSIONS  1. Left ventricular ejection fraction, by estimation, is 60 to 65%. The left ventricle has normal function. The left ventricle has no regional wall motion abnormalities. There is mild left ventricular hypertrophy. Left ventricular diastolic parameters are indeterminate.  2. Right ventricular systolic function is normal. The right ventricular size is normal. Tricuspid regurgitation signal is inadequate for assessing PA pressure.  3. The mitral valve is grossly normal. Trivial mitral valve regurgitation.  4. The aortic valve is tricuspid. Aortic valve regurgitation is not  visualized.  5. The inferior vena cava is normal in size with greater than 50% respiratory variability, suggesting right atrial pressure of 3 mmHg. FINDINGS  Left Ventricle: Left ventricular ejection fraction, by estimation, is 60 to 65%. The left ventricle has normal function. The left ventricle has no regional wall motion abnormalities. The left ventricular internal cavity size was normal in size. There is  mild left ventricular hypertrophy. Left ventricular diastolic parameters are indeterminate. Right Ventricle: The right ventricular size is normal. No increase in right ventricular wall thickness. Right ventricular systolic function is normal. Tricuspid regurgitation signal is inadequate for assessing PA pressure. Left Atrium: Left atrial size was normal in size. Right Atrium: Right atrial size was normal in size. Pericardium: There is no evidence of pericardial effusion. Mitral Valve: The mitral valve is grossly normal. Trivial mitral valve regurgitation. Tricuspid Valve: The tricuspid valve is grossly normal. Tricuspid valve regurgitation is trivial. Aortic Valve: The aortic valve is tricuspid. Aortic valve regurgitation is not visualized. Mild aortic valve annular calcification. Pulmonic Valve: The pulmonic valve was grossly normal. Pulmonic valve regurgitation is trivial. Aorta: The aortic root is normal in size and structure. Venous: The inferior vena cava is normal in size with greater than 50% respiratory variability, suggesting right atrial pressure of 3 mmHg. IAS/Shunts: No atrial level shunt detected by color flow Doppler.  LEFT VENTRICLE PLAX 2D LVIDd:         4.29 cm Diastology LVIDs:  2.43 cm LV e' lateral:   11.90 cm/s LV PW:         1.29 cm LV E/e' lateral: 6.1 LV IVS:        1.29 cm LV e' medial:    5.00 cm/s                        LV E/e' medial:  14.4  RIGHT VENTRICLE RV S prime:     12.30 cm/s TAPSE (M-mode): 2.1 cm LEFT ATRIUM             Index       RIGHT ATRIUM           Index LA  diam:        3.40 cm 1.49 cm/m  RA Area:     13.00 cm LA Vol (A2C):   72.0 ml 31.50 ml/m RA Volume:   31.00 ml  13.56 ml/m LA Vol (A4C):   68.5 ml 29.97 ml/m LA Biplane Vol: 73.8 ml 32.29 ml/m   AORTA Ao Root diam: 2.90 cm MITRAL VALVE MV Area (PHT): 2.43 cm MV Decel Time: 312 msec MV E velocity: 72.00 cm/s MV A velocity: 71.60 cm/s MV E/A ratio:  1.01 Rozann Lesches MD Electronically signed by Rozann Lesches MD Signature Date/Time: 02/09/2020/10:37:52 AM    Final    CT HEAD CODE STROKE WO CONTRAST  Result Date: 02/08/2020 CLINICAL DATA:  Code stroke.  Left eye blurry vision EXAM: CT HEAD WITHOUT CONTRAST TECHNIQUE: Contiguous axial images were obtained from the base of the skull through the vertex without intravenous contrast. COMPARISON:  2018 FINDINGS: Brain: There is no acute intracranial hemorrhage, mass effect or edema. Gray-white differentiation is preserved. Ventricles and sulci are normal in size and configuration. There is no extra-axial fluid collection. Vascular: No hyperdense vessel. Skull: Unremarkable. Sinuses/Orbits: No acute abnormality. Other: Mastoid air cells are clear. ASPECTS ([place] Stroke Program Early CT Score) - Ganglionic level infarction (caudate, lentiform nuclei, internal capsule, insula, M1-M3 cortex): 7 - Supraganglionic infarction (M4-M6 cortex): 3 Total score (0-10 with 10 being normal): 10 IMPRESSION: No acute intracranial hemorrhage or evidence of acute infarction. ASPECT score is 10. Electronically Signed   By: Macy Mis M.D.   On: 02/08/2020 16:34   CT ANGIO HEAD CODE STROKE  Result Date: 02/08/2020 CLINICAL DATA:  Left-sided weakness and blurry vision EXAM: CT ANGIOGRAPHY HEAD AND NECK TECHNIQUE: Multidetector CT imaging of the head and neck was performed using the standard protocol during bolus administration of intravenous contrast. Multiplanar CT image reconstructions and MIPs were obtained to evaluate the vascular anatomy. Carotid stenosis measurements  (when applicable) are obtained utilizing NASCET criteria, using the distal internal carotid diameter as the denominator. CONTRAST:  95mL OMNIPAQUE IOHEXOL 350 MG/ML SOLN COMPARISON:  None. FINDINGS: CTA NECK Aortic arch: Great vessel origins are patent Right carotid system: Patent. Minimal calcified plaque at the ICA origin without measurable stenosis. There is partially retropharyngeal course. Left carotid system: Patent with partially retropharyngeal course. No measurable stenosis at the ICA origin. Vertebral arteries: Patent. Left vertebral artery is slightly dominant Skeleton: Mild degenerative changes of the cervical spine. Other neck: No mass or adenopathy. Upper chest: No apical lung mass. Review of the MIP images confirms the above findings CTA HEAD Anterior circulation: Intracranial internal carotid arteries are patent. Anterior and middle cerebral arteries are patent. Posterior circulation: Intracranial vertebral arteries, basilar artery, and posterior cerebral arteries are patent. Venous sinuses: Patent as allowed by contrast bolus timing.  Review of the MIP images confirms the above findings IMPRESSION: No large vessel occlusion or hemodynamically significant stenosis. Electronically Signed   By: Macy Mis M.D.   On: 02/08/2020 17:27   CT ANGIO NECK CODE STROKE  Result Date: 02/08/2020 CLINICAL DATA:  Left-sided weakness and blurry vision EXAM: CT ANGIOGRAPHY HEAD AND NECK TECHNIQUE: Multidetector CT imaging of the head and neck was performed using the standard protocol during bolus administration of intravenous contrast. Multiplanar CT image reconstructions and MIPs were obtained to evaluate the vascular anatomy. Carotid stenosis measurements (when applicable) are obtained utilizing NASCET criteria, using the distal internal carotid diameter as the denominator. CONTRAST:  26mL OMNIPAQUE IOHEXOL 350 MG/ML SOLN COMPARISON:  None. FINDINGS: CTA NECK Aortic arch: Great vessel origins are patent Right  carotid system: Patent. Minimal calcified plaque at the ICA origin without measurable stenosis. There is partially retropharyngeal course. Left carotid system: Patent with partially retropharyngeal course. No measurable stenosis at the ICA origin. Vertebral arteries: Patent. Left vertebral artery is slightly dominant Skeleton: Mild degenerative changes of the cervical spine. Other neck: No mass or adenopathy. Upper chest: No apical lung mass. Review of the MIP images confirms the above findings CTA HEAD Anterior circulation: Intracranial internal carotid arteries are patent. Anterior and middle cerebral arteries are patent. Posterior circulation: Intracranial vertebral arteries, basilar artery, and posterior cerebral arteries are patent. Venous sinuses: Patent as allowed by contrast bolus timing. Review of the MIP images confirms the above findings IMPRESSION: No large vessel occlusion or hemodynamically significant stenosis. Electronically Signed   By: Macy Mis M.D.   On: 02/08/2020 17:27    Micro Results   Recent Results (from the past 240 hour(s))  SARS Coronavirus 2 by RT PCR (hospital order, performed in [facility] hospital lab) Nasopharyngeal Nasopharyngeal Swab     Status: None   Collection Time: 02/08/20  5:45 PM   Specimen: Nasopharyngeal Swab  Result Value Ref Range Status   SARS Coronavirus 2 NEGATIVE NEGATIVE Final    Comment: (NOTE) SARS-CoV-2 target nucleic acids are NOT DETECTED.  The SARS-CoV-2 RNA is generally detectable in upper and lower respiratory specimens during the acute phase of infection. The lowest concentration of SARS-CoV-2 viral copies this assay can detect is 250 copies / mL. A negative result does not preclude SARS-CoV-2 infection and should not be used as the sole basis for treatment or other patient management decisions.  A negative result may occur with improper specimen collection / handling, submission of specimen other than nasopharyngeal swab,  presence of viral mutation(s) within the areas targeted by this assay, and inadequate number of viral copies (<250 copies / mL). A negative result must be combined with clinical observations, patient history, and epidemiological information.  Fact Sheet for Patients:   StrictlyIdeas.no  Fact Sheet for Healthcare Providers: BankingDealers.co.za  This test is not yet approved or  cleared by the [place] FDA and has been authorized for detection and/or diagnosis of SARS-CoV-2 by FDA under an Emergency Use Authorization (EUA).  This EUA will remain in effect (meaning this test can be used) for the duration of the COVID-19 declaration under Section 564(b)(1) of the Act, 21 U.S.C. section 360bbb-3(b)(1), unless the authorization is terminated or revoked sooner.  Performed at [facility], [address] [place], [place] [address]     Today   Subjective    Cathy Thomas today has no new complaints  -- left-sided facial numbness, visual disturbance, left arm numbness and tingling and episodes of confusion has all  resolved---         Patient has been seen and examined prior to discharge   Objective   Blood pressure 135/62, pulse 75, temperature 98.3 F (36.8 C), temperature source Oral, resp. rate 18, height 5\' 7"  (1.702 m), weight 121 kg, SpO2 99 %.   Intake/Output Summary (Last 24 hours) at 02/09/2020 1739 Last data filed at 02/09/2020 1700 Gross per 24 hour  Intake 652.9 ml  Output 1372 ml  Net -719.1 ml   Exam Gen:- Awake Alert, no acute distress, morbidly obese HEENT:- [place].AT, No sclera icterus Neck-Supple Neck,No JVD,.  Lungs-  CTAB , good air movement bilaterally  CV- S1, S2 normal, regular Abd-  +ve B.Sounds, Abd Soft, No tenderness, increased truncal adiposity    Extremity/Skin:- No  edema,   good pulses Psych-affect is appropriate, oriented x3 Neuro-no new focal deficits, no tremors   Data Review   CBC w Diff:   Lab Results  Component Value Date   WBC 9.5 02/08/2020   HGB 12.2 02/08/2020   HCT 36.0 02/08/2020   PLT 317 02/08/2020   LYMPHOPCT 20 02/08/2020   MONOPCT 7 02/08/2020   EOSPCT 2 02/08/2020   BASOPCT 0 02/08/2020    CMP:  Lab Results  Component Value Date   NA 141 02/08/2020   K 3.7 02/08/2020   CL 106 02/08/2020   CO2 25 02/08/2020   BUN 18 02/08/2020   CREATININE 1.00 02/08/2020   PROT 7.8 02/08/2020   ALBUMIN 3.8 02/08/2020   BILITOT 0.9 02/08/2020   ALKPHOS 88 02/08/2020   AST 19 02/08/2020   ALT 23 02/08/2020  .  Total Discharge time is about 33 minutes  Roxan Hockey M.D on 02/09/2020 at 5:39 PM  Go to [email or web address] -  for contact info  [facility] - Office  [phone]

## 2020-02-09 NOTE — Evaluation (Signed)
Physical Therapy Evaluation Patient Details Name: Cathy Thomas MRN: [id number] DOB: Jun 07, 1956 Today's Date: 02/09/2020   History of Present Illness  Cathy Thomas  is a 64 y.o. female, history of hypertension, hyperlipidemia, knee arthroscopic surgery 10/24/2019, fibromyalgia, possible history of TIA in 2011, patient presents to ED secondary to episode of confusion, left side tingling and numbness, and left neck pain, ports her symptoms started today at 2 PM, all of a sudden, she reports an episode of confusion, blurred vision, left side tingling and numbness, mainly in the cheeks, upon coming to the hospital she did feel some sudden left neck pain as well, and upon my evaluation she does report left upper extremity tingling and numbness, beside her altered sensation she denies any focal deficits, slurred speech, mentation currently back to baseline, she denies any fever, chills, chest pain or shortness of breath.Cathy Thomas  is a 64 y.o. female, history of hypertension, hyperlipidemia, knee arthroscopic surgery 10/24/2019, fibromyalgia, possible history of TIA in 2011, patient presents to ED secondary to episode of confusion, left side tingling and numbness, and left neck pain, ports her symptoms started today at 2 PM, all of a sudden, she reports an episode of confusion, blurred vision, left side tingling and numbness, mainly in the cheeks, upon coming to the hospital she did feel some sudden left neck pain as well, and upon my evaluation she does report left upper extremity tingling and numbness, beside her altered sensation she denies any focal deficits, slurred speech, mentation currently back to baseline, she denies any fever, chills, chest pain or shortness of breath. MRI negative for acute infarct.     Clinical Impression  Patient functioning near baseline for functional mobility and gait but remains limited by impaired activity tolerance, fatigue and decreased LE strength. Patient does not  require physical assist with bed mobility or transfers. She ambulates with minimally unsteady gait without AD with flexed posture. She states she did not ambulate with flexed posture since knee surgery. Patient has not had any physical therapy since surgery she states. Patient would benefit from outpatient physical therapy in order to improve LE strength and functional mobility. Patient does not require additional follow up by PT in hospital and all needs can be met at next venue of care.      Follow Up Recommendations Outpatient PT    Equipment Recommendations  None recommended by PT    Recommendations for Other Services       Precautions / Restrictions Precautions Precautions: Fall Restrictions Weight Bearing Restrictions: No      Mobility  Bed Mobility Overal bed mobility: Modified Independent             General bed mobility comments: to transition to seated EOB with HOB elevatd  Transfers Overall transfer level: Modified independent Equipment used: None             General transfer comment: to transfer to standing without AD  Ambulation/Gait Ambulation/Gait assistance: Supervision Gait Distance (Feet): 30 Feet Assistive device: None Gait Pattern/deviations: Step-through pattern;Decreased step length - left;Decreased stance time - right;Decreased stride length;Trunk flexed Gait velocity: decreased   General Gait Details: Patient ambulates with minimally unsteady gait without AD with trunk flexed, limited knee flexion extension ROM  Stairs            Wheelchair Mobility    Modified Rankin (Stroke Patients Only)       Balance Overall balance assessment: Needs assistance Sitting-balance support: No upper extremity supported;Feet supported Sitting balance-Leahy Scale: Normal  Sitting balance - Comments: seated EOB   Standing balance support: No upper extremity supported Standing balance-Leahy Scale: Fair Standing balance comment: fair/good  without AD                             Pertinent Vitals/Pain Pain Assessment: No/denies pain    Home Living Family/patient expects to be discharged to:: Private residence Living Arrangements: Alone Available Help at Discharge: Family;Available PRN/intermittently Type of Home: House Home Access: Stairs to enter Entrance Stairs-Rails: Right;Left;Can reach both Entrance Stairs-Number of Steps: 3 Home Layout: One level Home Equipment: Walker - 2 wheels;Cane - single point;Grab bars - tub/shower;Shower seat      Prior Function Level of Independence: Independent         Comments: independent in ADLs, mobility, drives, works, 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     Hand Dominance   Dominant Hand: Right    Extremity/Trunk Assessment   Upper Extremity Assessment Upper Extremity Assessment: Defer to OT evaluation    Lower Extremity Assessment Lower Extremity Assessment: Overall WFL for tasks assessed;Generalized weakness    Cervical / Trunk Assessment Cervical / Trunk Assessment: Normal  Communication   Communication: No difficulties  Cognition Arousal/Alertness: Awake/alert Behavior During Therapy: WFL for tasks assessed/performed Overall Cognitive Status: Within Functional Limits for tasks assessed                                        General Comments      Exercises     Assessment/Plan    PT Assessment All further PT needs can be met in the next venue of care  PT Problem List Decreased strength;Decreased activity tolerance;Decreased balance;Decreased mobility       PT Treatment Interventions      PT Goals (Current goals can be found in the Care Plan section)  Acute Rehab PT Goals Patient Stated Goal: Get her legs stronger with OPPT PT Goal Formulation: With patient Time For Goal Achievement: 02/09/20 Potential to Achieve Goals: Good    Frequency     Barriers to discharge        Co-evaluation                AM-PAC PT "6 Clicks" Mobility  Outcome Measure Help needed turning from your back to your side while in a flat bed without using bedrails?: None Help needed moving from lying on your back to sitting on the side of a flat bed without using bedrails?: None Help needed moving to and from a bed to a chair (including a wheelchair)?: None Help needed standing up from a chair using your arms (e.g., wheelchair or bedside chair)?: None Help needed to walk in hospital room?: A Little Help needed climbing 3-5 steps with a railing? : A Little 6 Click Score: 22    End of Session Equipment Utilized During Treatment: Gait belt Activity Tolerance: Patient tolerated treatment well Patient left: in bed;with call bell/phone within reach Nurse Communication: Mobility status PT Visit Diagnosis: Unsteadiness on feet (R26.81);Other abnormalities of gait and mobility (R26.89);Muscle weakness (generalized) (M62.81)    Time: 0867-6195 PT Time Calculation (min) (ACUTE ONLY): 10 min   Charges:   PT Evaluation $PT Eval Low Complexity: 1 Low          9:12 AM, 02/09/20 Mearl Latin PT, DPT Physical Therapist at [facility] [place]

## 2020-02-09 NOTE — Evaluation (Signed)
Occupational Therapy Evaluation Patient Details Name: Cathy Thomas MRN: [id number] DOB: [birth year] Today's Date: 02/09/2020    History of Present Illness Cathy Thomas  is a 64 y.o. female, history of hypertension, hyperlipidemia, knee arthroscopic surgery 10/24/2019, fibromyalgia, possible history of TIA in 2011, patient presents to ED secondary to episode of confusion, left side tingling and numbness, and left neck pain, ports her symptoms started today at 2 PM, all of a sudden, she reports an episode of confusion, blurred vision, left side tingling and numbness, mainly in the cheeks, upon coming to the hospital she did feel some sudden left neck pain as well, and upon my evaluation she does report left upper extremity tingling and numbness, beside her altered sensation she denies any focal deficits, slurred speech, mentation currently back to baseline, she denies any fever, chills, chest pain or shortness of breath.Cathy Thomas  is a 64 y.o. female, history of hypertension, hyperlipidemia, knee arthroscopic surgery 10/24/2019, fibromyalgia, possible history of TIA in 2011, patient presents to ED secondary to episode of confusion, left side tingling and numbness, and left neck pain, ports her symptoms started today at 2 PM, all of a sudden, she reports an episode of confusion, blurred vision, left side tingling and numbness, mainly in the cheeks, upon coming to the hospital she did feel some sudden left neck pain as well, and upon my evaluation she does report left upper extremity tingling and numbness, beside her altered sensation she denies any focal deficits, slurred speech, mentation currently back to baseline, she denies any fever, chills, chest pain or shortness of breath. MRI negative for acute infarct.    Clinical Impression   Pt agreeable to OT evaluation, reports mild tingling in face and left arm. Pt demonstrates BUE strength WNL, sensation and coordination are intact. Pt is performing ADLs  independently, is at baseline. No further OT services required at this time.     Follow Up Recommendations  No OT follow up    Equipment Recommendations  None recommended by OT       Precautions / Restrictions Precautions Precautions: None Restrictions Weight Bearing Restrictions: No      Mobility Bed Mobility Overal bed mobility: Modified Independent                Transfers Overall transfer level: Modified independent                        ADL either performed or assessed with clinical judgement   ADL Overall ADL's : Modified independent;At baseline                                             Vision Baseline Vision/History: Wears glasses Wears Glasses: At all times Patient Visual Report: No change from baseline Vision Assessment?: Yes Eye Alignment: Within Functional Limits Ocular Range of Motion: Within Functional Limits Alignment/Gaze Preference: Within Defined Limits Tracking/Visual Pursuits: Able to track stimulus in all quads without difficulty Saccades: Within functional limits Convergence: Within functional limits Visual Fields: No apparent deficits            Pertinent Vitals/Pain Pain Assessment: No/denies pain     Hand Dominance Right   Extremity/Trunk Assessment Upper Extremity Assessment Upper Extremity Assessment: Overall WFL for tasks assessed   Lower Extremity Assessment Lower Extremity Assessment: Defer to PT evaluation   Cervical / Trunk Assessment Cervical /  Trunk Assessment: Normal   Communication Communication Communication: No difficulties   Cognition Arousal/Alertness: Awake/alert Behavior During Therapy: WFL for tasks assessed/performed Overall Cognitive Status: Within Functional Limits for tasks assessed                                                Home Living Family/patient expects to be discharged to:: Private residence Living Arrangements: Alone Available Help  at Discharge: Family;Available PRN/intermittently Type of Home: House Home Access: Stairs to enter [employer] of Steps: 3 Entrance Stairs-Rails: Right;Left;Can reach both Home Layout: One level     Bathroom Shower/Tub: Occupational psychologist: Standard     Home Equipment: Environmental consultant - 2 wheels;Cane - single point;Grab bars - tub/shower          Prior Functioning/Environment Level of Independence: Independent        Comments: independent in ADLs, mobility, drives, works         AM-PAC OT "6 Clicks" Daily Activity     Outcome Measure Help from another person eating meals?: None Help from another person taking care of personal grooming?: None Help from another person toileting, which includes using toliet, bedpan, or urinal?: None Help from another person bathing (including washing, rinsing, drying)?: None Help from another person to put on and taking off regular upper body clothing?: None Help from another person to put on and taking off regular lower body clothing?: None 6 Click Score: 24   End of Session    Activity Tolerance: Patient tolerated treatment well Patient left: in bed;with call bell/phone within reach  OT Visit Diagnosis: Muscle weakness (generalized) (M62.81)                Time: 9381-8299 OT Time Calculation (min): 18 min Charges:  OT General Charges $OT Visit: 1 Visit OT Evaluation $OT Eval Low Complexity: Mitchell, OTR/L  [phone] 02/09/2020, 7:55 AM

## 2020-02-09 NOTE — Progress Notes (Signed)
Nsg Discharge Note  Admit Date:  02/08/2020 Discharge date: 02/09/2020   Cathy Thomas to be D/C'd home per MD order.  AVS completed.  Copy for chart, and copy for patient signed, and dated. Patient/caregiver able to verbalize understanding.  Discharge Medication: Allergies as of 02/09/2020      Reactions   Amoxicillin Itching   Has patient had a PCN reaction causing immediate rash, facial/tongue/throat swelling, SOB or lightheadedness with hypotension: Unknown Has patient had a PCN reaction causing severe rash involving mucus membranes or skin necrosis: Unknown Has patient had a PCN reaction that required hospitalization: Unknown Has patient had a PCN reaction occurring within the last 10 years: Unknown If all of the above answers are "NO", then may proceed with Cephalosporin use.   Fluconazole Rash   burning sensation,       Medication List    STOP taking these medications   ibuprofen 800 MG tablet Commonly known as: ADVIL   meloxicam 7.5 MG tablet Commonly known as: Mobic   promethazine 12.5 MG tablet Commonly known as: PHENERGAN     TAKE these medications   acetaminophen 325 MG tablet Commonly known as: TYLENOL Take 2 tablets (650 mg total) by mouth every 4 (four) hours as needed for mild pain (or temp > 37.5 C (99.5 F)).   aspirin EC 81 MG tablet Take 1 tablet (81 mg total) by mouth daily with breakfast. Please take Aspirin 81 mg daily along with Plavix 75 mg daily for 30 days then after that STOP the aspirin and continue ONLY Plavix 75 mg daily indefinitely--for stroke prevention What changed:   when to take this  additional instructions   atorvastatin 40 MG tablet Commonly known as: LIPITOR Take 1 tablet (40 mg total) by mouth daily. What changed:   medication strength  how much to take   clopidogrel 75 MG tablet Commonly known as: Plavix Take 1 tablet (75 mg total) by mouth daily. Please take Aspirin 81 mg daily along with Plavix 75 mg daily for 30 days  then after that STOP the aspirin and continue ONLY Plavix 75 mg daily indefinitely--for stroke prevention   HYDROcodone-acetaminophen 5-325 MG tablet Commonly known as: NORCO/VICODIN TAKE (1) TABLET BY MOUTH EVERY SIX HOURS AS NEEDED FOR MODERATE PAIN FOR UP TO 7 DAYS.   losartan 50 MG tablet Commonly known as: COZAAR Take 50 mg by mouth in the morning and at bedtime.   Lyrica 75 MG capsule Generic drug: pregabalin Take 75-150 mg by mouth See admin instructions. Takes 2 capsules (150 mg)  in the morning & if experiencing severe pain 1 capsule (75 mg) at bedtime.   Magnesium 500 MG Caps Take 1 capsule by mouth daily.   Ozempic (0.25 or 0.5 MG/DOSE) 2 MG/1.5ML Sopn Generic drug: Semaglutide(0.25 or 0.5MG /DOS) Inject 0.5 mg into the skin once a week.   Vitamin D (Ergocalciferol) 1.25 MG (50000 UNIT) Caps capsule Commonly known as: DRISDOL Take 50,000 Units by mouth every Friday.       Discharge Assessment: Vitals:   02/09/20 0422 02/09/20 1433  BP: (!) 141/55 135/62  Pulse: 77 75  Resp: 20 18  Temp: 98.2 F (36.8 C) 98.3 F (36.8 C)  SpO2: 100% 99%   Skin clean, dry and intact without evidence of skin break down, no evidence of skin tears noted. IV catheter discontinued intact. Site without signs and symptoms of complications - no redness or edema noted at insertion site, patient denies c/o pain - only slight tenderness at  site.  Dressing with slight pressure applied.  D/c Instructions-Education: Discharge instructions given to patient/family with verbalized understanding. D/c education completed with patient/family including follow up instructions, medication list, d/c activities limitations if indicated, with other d/c instructions as indicated by MD - patient able to verbalize understanding, all questions fully answered. Patient instructed to return to ED, call 911, or call MD for any changes in condition.  Patient escorted via [place], and D/C home via private auto.  Zachery Conch, RN 02/09/2020 5:52 PM

## 2020-02-09 NOTE — Progress Notes (Signed)
SLP Cancellation Note  Patient Details Name: Cathy Thomas MRN: [id number] DOB: 08/21/1955   Cancelled treatment:       Reason Eval/Treat Not Completed: SLP screened, no needs identified, will sign off. All speech, language & cognitive symptoms have resolved. Thank you,  Alexis Reber H. Roddie Mc, CCC-SLP Speech Language Pathologist   Wende Bushy 02/09/2020, 9:47 AM

## 2020-02-09 NOTE — Progress Notes (Signed)
*  PRELIMINARY RESULTS* Echocardiogram 2D Echocardiogram has been performed.  Leavy Cella 02/09/2020, 8:40 AM

## 2020-02-09 NOTE — Plan of Care (Signed)
°  Problem: Education: Goal: Knowledge of General Education information will improve Description: Including pain rating scale, medication(s)/side effects and non-pharmacologic comfort measures Outcome: Adequate for Discharge   Problem: Health Behavior/Discharge Planning: Goal: Ability to manage health-related needs will improve Outcome: Adequate for Discharge   Problem: Clinical Measurements: Goal: Ability to maintain clinical measurements within normal limits will improve Outcome: Adequate for Discharge Goal: Will remain free from infection Outcome: Adequate for Discharge Goal: Diagnostic test results will improve Outcome: Adequate for Discharge Goal: Respiratory complications will improve Outcome: Adequate for Discharge Goal: Cardiovascular complication will be avoided Outcome: Adequate for Discharge   Problem: Activity: Goal: Risk for activity intolerance will decrease Outcome: Adequate for Discharge   Problem: Nutrition: Goal: Adequate nutrition will be maintained Outcome: Adequate for Discharge   Problem: Coping: Goal: Level of anxiety will decrease Outcome: Adequate for Discharge   Problem: Elimination: Goal: Will not experience complications related to bowel motility Outcome: Adequate for Discharge Goal: Will not experience complications related to urinary retention Outcome: Adequate for Discharge   Problem: Pain Managment: Goal: General experience of comfort will improve Outcome: Adequate for Discharge   Problem: Safety: Goal: Ability to remain free from injury will improve Outcome: Adequate for Discharge   Problem: Skin Integrity: Goal: Risk for impaired skin integrity will decrease Outcome: Adequate for Discharge   Problem: Education: Goal: Knowledge of General Education information will improve Description: Including pain rating scale, medication(s)/side effects and non-pharmacologic comfort measures Outcome: Adequate for Discharge   Problem: Health  Behavior/Discharge Planning: Goal: Ability to manage health-related needs will improve Outcome: Adequate for Discharge   Problem: Clinical Measurements: Goal: Ability to maintain clinical measurements within normal limits will improve Outcome: Adequate for Discharge Goal: Will remain free from infection Outcome: Adequate for Discharge Goal: Diagnostic test results will improve Outcome: Adequate for Discharge Goal: Respiratory complications will improve Outcome: Adequate for Discharge Goal: Cardiovascular complication will be avoided Outcome: Adequate for Discharge   Problem: Activity: Goal: Risk for activity intolerance will decrease Outcome: Adequate for Discharge   Problem: Nutrition: Goal: Adequate nutrition will be maintained Outcome: Adequate for Discharge   Problem: Coping: Goal: Level of anxiety will decrease Outcome: Adequate for Discharge   Problem: Elimination: Goal: Will not experience complications related to bowel motility Outcome: Adequate for Discharge Goal: Will not experience complications related to urinary retention Outcome: Adequate for Discharge   Problem: Pain Managment: Goal: General experience of comfort will improve Outcome: Adequate for Discharge   Problem: Safety: Goal: Ability to remain free from injury will improve Outcome: Adequate for Discharge   Problem: Skin Integrity: Goal: Risk for impaired skin integrity will decrease Outcome: Adequate for Discharge   Problem: Education: Goal: Knowledge of secondary prevention will improve Outcome: Adequate for Discharge Goal: Knowledge of patient specific risk factors addressed and post discharge goals established will improve Outcome: Adequate for Discharge   

## 2020-03-21 ENCOUNTER — Other Ambulatory Visit: Payer: Self-pay | Admitting: Orthopedic Surgery

## 2020-03-21 DIAGNOSIS — G8918 Other acute postprocedural pain: Secondary | ICD-10-CM

## 2020-03-21 DIAGNOSIS — Z9889 Other specified postprocedural states: Secondary | ICD-10-CM

## 2020-09-09 ENCOUNTER — Other Ambulatory Visit (HOSPITAL_COMMUNITY): Payer: Self-pay | Admitting: Nurse Practitioner

## 2020-09-09 DIAGNOSIS — Z1231 Encounter for screening mammogram for malignant neoplasm of breast: Secondary | ICD-10-CM

## 2020-09-16 ENCOUNTER — Ambulatory Visit: Payer: Medicare HMO | Admitting: Orthopedic Surgery

## 2020-09-16 DIAGNOSIS — M1711 Unilateral primary osteoarthritis, right knee: Secondary | ICD-10-CM

## 2020-09-16 DIAGNOSIS — M1712 Unilateral primary osteoarthritis, left knee: Secondary | ICD-10-CM

## 2020-09-23 ENCOUNTER — Ambulatory Visit (HOSPITAL_COMMUNITY)
Admission: RE | Admit: 2020-09-23 | Discharge: 2020-09-23 | Disposition: A | Discharge status: Home / Self Care | Payer: Medicare HMO | Source: Ambulatory Visit | Attending: Nurse Practitioner | Admitting: Nurse Practitioner

## 2020-09-23 ENCOUNTER — Other Ambulatory Visit: Payer: Self-pay

## 2020-09-23 DIAGNOSIS — Z1231 Encounter for screening mammogram for malignant neoplasm of breast: Secondary | ICD-10-CM | POA: Insufficient documentation

## 2020-09-26 ENCOUNTER — Emergency Department (HOSPITAL_COMMUNITY): Payer: Medicare HMO

## 2020-09-26 ENCOUNTER — Encounter (HOSPITAL_COMMUNITY): Payer: Self-pay | Admitting: Emergency Medicine

## 2020-09-26 ENCOUNTER — Other Ambulatory Visit: Payer: Self-pay

## 2020-09-26 ENCOUNTER — Emergency Department (HOSPITAL_COMMUNITY)
Admission: EM | Admit: 2020-09-26 | Discharge: 2020-09-26 | Disposition: A | Discharge status: Home / Self Care | Payer: Medicare HMO | Attending: Emergency Medicine | Admitting: Emergency Medicine

## 2020-09-26 DIAGNOSIS — I10 Essential (primary) hypertension: Secondary | ICD-10-CM | POA: Diagnosis not present

## 2020-09-26 DIAGNOSIS — R079 Chest pain, unspecified: Secondary | ICD-10-CM | POA: Diagnosis not present

## 2020-09-26 DIAGNOSIS — Z7982 Long term (current) use of aspirin: Secondary | ICD-10-CM | POA: Insufficient documentation

## 2020-09-26 DIAGNOSIS — R002 Palpitations: Secondary | ICD-10-CM | POA: Insufficient documentation

## 2020-09-26 DIAGNOSIS — Z7952 Long term (current) use of systemic steroids: Secondary | ICD-10-CM | POA: Insufficient documentation

## 2020-09-26 LAB — BASIC METABOLIC PANEL
Anion gap: 8 (ref 5–15)
BUN: 17 mg/dL (ref 8–23)
CO2: 26 mmol/L (ref 22–32)
Calcium: 8.9 mg/dL (ref 8.9–10.3)
Chloride: 104 mmol/L (ref 98–111)
Creatinine, Ser: 0.74 mg/dL (ref 0.44–1.00)
GFR, Estimated: >60 mL/min (ref >60)
Glucose, Bld: 94 mg/dL (ref 70–99)
Potassium: 3.5 mmol/L (ref 3.5–5.1)
Sodium: 138 mmol/L (ref 135–145)

## 2020-09-26 LAB — CBC WITH DIFFERENTIAL/PLATELET
Abs Immature Granulocytes: 0.03 10*3/uL (ref 0.00–0.07)
Basophils Absolute: 0.1 10*3/uL (ref 0.0–0.1)
Basophils Relative: 1 %
Eosinophils Absolute: 0.2 10*3/uL (ref 0.0–0.5)
Eosinophils Relative: 1 %
HCT: 39.1 % (ref 36.0–46.0)
Hemoglobin: 12.1 g/dL (ref 12.0–15.0)
Immature Granulocytes: 0 %
Lymphocytes Relative: 20 %
Lymphs Abs: 2.4 10*3/uL (ref 0.7–4.0)
MCH: 25.7 pg — ABNORMAL LOW (ref 26.0–34.0)
MCHC: 30.9 g/dL (ref 30.0–36.0)
MCV: 83 fL (ref 80.0–100.0)
Monocytes Absolute: 0.9 10*3/uL (ref 0.1–1.0)
Monocytes Relative: 8 %
Neutro Abs: 8.2 10*3/uL — ABNORMAL HIGH (ref 1.7–7.7)
Neutrophils Relative %: 70 %
Platelets: 330 10*3/uL (ref 150–400)
RBC: 4.71 MIL/uL (ref 3.87–5.11)
RDW: 14.6 % (ref 11.5–15.5)
WBC: 11.7 10*3/uL — ABNORMAL HIGH (ref 4.0–10.5)
nRBC: 0 % (ref 0.0–0.2)

## 2020-09-26 LAB — TROPONIN I (HIGH SENSITIVITY)
Troponin I (High Sensitivity): 4 ng/L (ref <18)
Troponin I (High Sensitivity): 4 ng/L (ref <18)

## 2020-09-26 LAB — MAGNESIUM: Magnesium: 2 mg/dL (ref 1.7–2.4)

## 2020-09-26 LAB — TSH: TSH: 1.129 u[IU]/mL (ref 0.350–4.500)

## 2020-09-26 NOTE — ED Triage Notes (Signed)
Pt states her heart has been fluttering for the last 2 months.  She states it usually subsides after she urinates several times during the night. It did not subside today resulting in central chest pain/pressure.

## 2020-09-26 NOTE — ED Provider Notes (Signed)
[facility] EMERGENCY DEPARTMENT Provider Note   CSN: [id number] Arrival date & time: 09/26/20  1741     History Chief Complaint  Patient presents with  . Chest Pain    Cathy Thomas is a 65 y.o. female.  HPI      Cathy Thomas is a 65 y.o. female, with a history of fibromyalgia, HTN, TIA, presenting to the ED with palpitations since last night. Patient states she has been experiencing palpitations intermittently, usually at night, but these typically resolve overnight.  Her palpitations that began last night have continued to recur.  She cannot give a definite duration of these episodes, though she does state her symptoms are nonexertional. She has had bilateral lower extremity swelling for at least the last year.  She adds she was seen by her orthopedic specialist and he started her on atorvastatin, which she states improved her swelling.  Denies fever/chills, any other chest discomfort, shortness of breath, cough, orthopnea, abdominal pain, diaphoresis, dizziness, syncope, N/V/D, or any other complaints.   Past Medical History:  Diagnosis Date  . Fibromyalgia   . Headache   . Hypertension   . Kidney stone   . Stroke ([place])   . TIA (transient ischemic attack)     Patient Active Problem List   Diagnosis Date Noted  . Focal neurological deficit 02/08/2020  . S/P left knee arthroscopy 10/24/19 10/30/2019  . Derangement of posterior horn of medial meniscus of left knee   . Lateral meniscus derangement, left   . Primary osteoarthritis of left knee   . Essential hypertension 02/22/2019  . Hyperlipidemia 02/22/2019  . Bilateral leg pain 02/22/2019  . BV (bacterial vaginosis) 04/24/2015  . Sprain and strain of other specified sites of knee and leg 12/05/2009  . Tendonitis, Achilles 10/08/2008  . Fibromyalgia 07/15/2006  . Plantar fasciitis, bilateral 07/15/2006  . 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 Degeneration of lumbar or lumbosacral intervertebral disc  01/04/2006  . Obesity 01/04/2006  . Osteoarthrosis involving lower leg 01/04/2006  . Sprain of lumbar region 01/04/2006    Past Surgical History:  Procedure Laterality Date  . ABDOMINAL HYSTERECTOMY    . BALLOON DILATION  12/16/2011   Procedure: BALLOON DILATION;  Surgeon: Marissa Nestle, MD;  Location: [facility] ORS;  Service: Urology;  Laterality: Left;  . CESAREAN SECTION    . COLONOSCOPY N/A 01/18/2018   Procedure: COLONOSCOPY;  Surgeon: Daneil Dolin, MD;  Location: [facility] ENDO SUITE;  Service: Endoscopy;  Laterality: N/A;  10:15  . KNEE ARTHROSCOPY    . KNEE ARTHROSCOPY WITH LATERAL MENISECTOMY Left 10/24/2019   Procedure: KNEE ARTHROSCOPY WITH MEDIAL AND LATERAL MENISECTOMY;  Surgeon: Carole Civil, MD;  Location: [facility] ORS;  Service: Orthopedics;  Laterality: Left;  . STONE EXTRACTION WITH BASKET  12/16/2011   Procedure: STONE EXTRACTION WITH BASKET;  Surgeon: Marissa Nestle, MD;  Location: [facility] ORS;  Service: Urology;  Laterality: Left;     OB History   No obstetric history on file.     Family History  Problem Relation Age of Onset  . Cancer Mother        ovarian  . Hypertension Mother   . Heart disease Mother   . Diabetes Mother   . Cancer Father        lung, prostate  . Hypertension Father   . Colon cancer Neg Hx     Social History   Tobacco Use  . Smoking status: Never Smoker  . Smokeless tobacco: Never Used  Vaping Use  . Vaping Use: Never used  Substance Use Topics  . Alcohol use: No  . Drug use: No    Home Medications Prior to Admission medications   Medication Sig Start Date End Date Taking? Authorizing Provider  acetaminophen (TYLENOL) 325 MG tablet Take 2 tablets (650 mg total) by mouth every 4 (four) hours as needed for mild pain (or temp > 37.5 C (99.5 F)). 02/09/20  Yes Roxan Hockey, MD  aspirin EC 81 MG tablet Take 1 tablet (81 mg total) by mouth daily with breakfast. Please take Aspirin 81 mg daily along with Plavix 75 mg daily for 30 days  then after that STOP the aspirin and continue ONLY Plavix 75 mg daily indefinitely--for stroke prevention 02/09/20  Yes Emokpae, Courage, MD  atorvastatin (LIPITOR) 40 MG tablet Take 1 tablet (40 mg total) by mouth daily. 02/09/20  Yes Emokpae, Courage, MD  losartan (COZAAR) 50 MG tablet Take 50 mg by mouth in the morning and at bedtime.    Yes [provider]  LYRICA 75 MG capsule Take 75-150 mg by mouth See admin instructions. Takes 2 capsules (150 mg)  in the morning & if experiencing severe pain 1 capsule (75 mg) at bedtime. 05/31/14  Yes [provider]  Magnesium 500 MG CAPS Take 1 capsule by mouth daily.   Yes [provider]  OZEMPIC, 0.25 OR 0.5 MG/DOSE, 2 MG/1.5ML SOPN Inject 0.5 mg into the skin once a week.  01/09/20  Yes [provider]  Vitamin D, Ergocalciferol, (DRISDOL) 50000 units CAPS capsule Take 50,000 Units by mouth every Friday.    Yes [provider]  clopidogrel (PLAVIX) 75 MG tablet Take 1 tablet (75 mg total) by mouth daily. Please take Aspirin 81 mg daily along with Plavix 75 mg daily for 30 days then after that STOP the aspirin and continue ONLY Plavix 75 mg daily indefinitely--for stroke prevention Patient not taking: No sig reported 02/09/20 02/08/21  Roxan Hockey, MD  HYDROcodone-acetaminophen (NORCO/VICODIN) 5-325 MG tablet TAKE (1) TABLET BY MOUTH EVERY SIX HOURS AS NEEDED FOR MODERATE PAIN FOR UP TO 7 DAYS. Patient not taking: No sig reported 03/22/20   Carole Civil, MD    Allergies    Amoxicillin and Fluconazole  Review of Systems   Review of Systems  Constitutional: Negative for chills, diaphoresis, fatigue and fever.  Respiratory: Negative for shortness of breath.   Cardiovascular: Positive for palpitations. Negative for leg swelling (none acute).  Gastrointestinal: Negative for abdominal pain, diarrhea, nausea and vomiting.  Musculoskeletal: Negative for back pain.  Neurological: Negative for dizziness, syncope  and weakness.  All other systems reviewed and are negative.   Physical Exam Updated Vital Signs BP (!) 155/80   Pulse 83   Temp 98 F (36.7 C) (Oral)   Resp 15   Ht 5\' 7"  (1.702 m)   Wt 108.9 kg   SpO2 100%   BMI 37.59 kg/m   Physical Exam Vitals and nursing note reviewed.  Constitutional:      General: She is not in acute distress.    Appearance: She is well-developed. She is obese. She is not diaphoretic.  HENT:     Head: Normocephalic and atraumatic.     Mouth/Throat:     Mouth: Mucous membranes are moist.     Pharynx: Oropharynx is clear.  Eyes:     Conjunctiva/sclera: Conjunctivae normal.  Cardiovascular:     Rate and Rhythm: Normal rate and regular rhythm.  Pulses: Normal pulses.          Radial pulses are 2+ on the right side and 2+ on the left side.       Dorsalis pedis pulses are 2+ on the right side and 2+ on the left side.     Heart sounds: Normal heart sounds.     Comments: Tactile temperature in the extremities appropriate and equal bilaterally. There could perhaps be some lower extremity edema, however, it is difficult to tell due to the patient's body habitus.  She does state she does not have acute edema. Pulmonary:     Effort: Pulmonary effort is normal. No respiratory distress.     Breath sounds: Normal breath sounds.  Abdominal:     Palpations: Abdomen is soft.     Tenderness: There is no abdominal tenderness. There is no guarding.  Musculoskeletal:     Cervical back: Neck supple.  Skin:    General: Skin is warm and dry.  Neurological:     Mental Status: She is alert.  Psychiatric:        Mood and Affect: Mood and affect normal.        Speech: Speech normal.        Behavior: Behavior normal.     ED Results / Procedures / Treatments   Labs (all labs ordered are listed, but only abnormal results are displayed) Labs Reviewed  CBC WITH DIFFERENTIAL/PLATELET - Abnormal; Notable for the following components:      Result Value   WBC 11.7  (*)    MCH 25.7 (*)    Neutro Abs 8.2 (*)    All other components within normal limits  BASIC METABOLIC PANEL  MAGNESIUM  TSH  TROPONIN I (HIGH SENSITIVITY)  TROPONIN I (HIGH SENSITIVITY)    EKG EKG Interpretation  Date/Time:  Thursday September 26 2020 18:02:04 EST Ventricular Rate:  82 PR Interval:    QRS Duration: 89 QT Interval:  374 QTC Calculation: 437 R Axis:   52 Text Interpretation: Sinus rhythm Minimal ST depression, inferior leads no changes since last Confirmed by Noemi Chapel [phone]) on 09/26/2020 6:04:47 PM   Radiology DG Chest 2 View  Result Date: 09/26/2020 CLINICAL DATA:  Palpitation and chest discomfort EXAM: CHEST - 2 VIEW COMPARISON:  01/07/2018 FINDINGS: The heart size and mediastinal contours are within normal limits. Both lungs are clear. Degenerative changes of the spine. IMPRESSION: No active cardiopulmonary disease. Electronically Signed   By: Donavan Foil M.D.   On: 09/26/2020 18:54    Procedures Procedures   Medications Ordered in ED Medications - No data to display  ED Course  I have reviewed the triage vital signs and the nursing notes.  Pertinent labs & imaging results that were available during my care of the patient were reviewed by me and considered in my medical decision making (see chart for details).  Clinical Course as of 09/26/20 2042  Thu Sep 26, 2020  1845 BP(!): 169/139 Erroneous reading. [SJ]  2019 Patient states she feels much better.  No longer has palpitations. [SJ]    Clinical Course User Index [SJ] , Helane Gunther, PA-C   MDM Rules/Calculators/A&P                          Patient presents with episodes of palpitations. Patient is nontoxic appearing, afebrile, not tachycardic, not tachypneic, not hypotensive, maintains excellent SPO2 on room air, and is in no apparent distress.   I have reviewed  the patient's chart to obtain more information.   I reviewed and interpreted the patient's labs and radiological  studies. No acute abnormalities on EKG.  Troponin is negative.  Other lab work reassuring. Recommend close cardiology follow-up. Return cautions discussed. Patient voices understanding of these instructions, accepts the plan, and is comfortable with discharge.  Findings and plan of care discussed with attending physician, Noemi Chapel, MD.   End of shift patient care handoff report given to [facility], PA-C. Plan: If second troponin is unremarkable, discharge patient.   Vitals:   09/26/20 1804 09/26/20 1805 09/26/20 1830 09/26/20 1900  BP:   (!) 169/139 (!) 160/75  Pulse:   80 74  Resp:   19 15  Temp: 98 F (36.7 C)     TempSrc: Oral     SpO2:   100% 100%  Weight:  108.9 kg    Height:  5\' 7"  (1.702 m)       Final Clinical Impression(s) / ED Diagnoses Final diagnoses:  Palpitations    Rx / DC Orders ED Discharge Orders    None       Layla Maw 09/26/20 2100    Noemi Chapel, MD 09/28/20 [phone]

## 2020-09-26 NOTE — Discharge Instructions (Addendum)
Recommend follow-up with cardiology on this matter.  Call to make an appointment.  We would prefer you follow-up with cardiology within the next 72 hours.

## 2020-10-16 NOTE — Progress Notes (Unsigned)
Cardiology Office Note   Date:  10/17/2020   ID:  Cathy Thomas, DOB 1956-07-08, MRN [id number]  PCP:  Renee Rival, NP  Cardiologist:   Minus Breeding, MD  Chief Complaint  Patient presents with  . Palpitations      History of Present Illness: Cathy Thomas is a 65 y.o. female who presents for follow up of leg pain.  She saw Dr. Bronson Ing for this.  However, she was referred to Dr. Gwenlyn Found.  She had normal ABIs and no further work up was suggested in 2020.      She was in the ED recently with palpitations.  There were no arrhythmias or abnormalities on EKG.  Trop was negative x 2.  She had a TIA in 2021   I reviewed these notes as well.  Echo was normal except for mild LVH.  There was no evidence of arrhythmia.  She has not had any cardiac work-up otherwise.  She does feel palpitations.  She describes her heart fluttering.  She said this started about 6 months ago.  It is sporadic.  She says it is interesting that when she gets this she feels like she has to urinate.  She has diffuse muscle aches and after she urinates she actually says this gets better.  She has this diffuse aching in her legs for which she was evaluated as above.  She does try to exercise and she uses a "teeter" which is like a recumbent elliptical.  She does this 10 minutes daily and this does not bring on any symptoms.  She does not have chest pressure, neck or arm discomfort.  She does not have new shortness of breath, PND or orthopnea.  She has some mild lower extremity swelling.    Past Medical History:  Diagnosis Date  . Fibromyalgia   . Headache   . Hypertension   . Kidney stone   . TIA (transient ischemic attack)     Past Surgical History:  Procedure Laterality Date  . ABDOMINAL HYSTERECTOMY    . BALLOON DILATION  12/16/2011   Procedure: BALLOON DILATION;  Surgeon: Marissa Nestle, MD;  Location: [facility] ORS;  Service: Urology;  Laterality: Left;  . CESAREAN SECTION    . COLONOSCOPY N/A  01/18/2018   Procedure: COLONOSCOPY;  Surgeon: Daneil Dolin, MD;  Location: [facility];  Service: Endoscopy;  Laterality: N/A;  10:15  . KNEE ARTHROSCOPY    . KNEE ARTHROSCOPY WITH LATERAL MENISECTOMY Left 10/24/2019   Procedure: KNEE ARTHROSCOPY WITH MEDIAL AND LATERAL MENISECTOMY;  Surgeon: Carole Civil, MD;  Location: [facility];  Service: Orthopedics;  Laterality: Left;  . STONE EXTRACTION WITH BASKET  12/16/2011   Procedure: STONE EXTRACTION WITH BASKET;  Surgeon: Marissa Nestle, MD;  Location: [facility] OR[facility];  Service: Urology;  Laterality: Left;     Current Outpatient Medications  Medication Sig Dispense Refill  . acetaminophen (TYLENOL) 325 MG tablet Take 2 tablets (650 mg total) by mouth every 4 (four) hours as needed for mild pain (or temp > 37.5 C (99.5 F)). 12 tablet 2  . aspirin EC 81 MG tablet Take 1 tablet (81 mg total) by mouth daily with breakfast. Please take Aspirin 81 mg daily along with Plavix 75 mg daily for 30 days then after that STOP the aspirin and continue ONLY Plavix 75 mg daily indefinitely--for stroke prevention 30 tablet 2  . atorvastatin (LIPITOR) 40 MG tablet Take 1 tablet (40 mg total) by mouth daily.  30 tablet 2  . losartan (COZAAR) 50 MG tablet Take 50 mg by mouth in the morning and at bedtime.     . Magnesium 500 MG CAPS Take 1 capsule by mouth daily.    . metoprolol tartrate (LOPRESSOR) 25 MG tablet TAKE 1 TABLET TWICE DAILY AS NEEDED 180 tablet 1  . OZEMPIC, 0.25 OR 0.5 MG/DOSE, 2 MG/1.5ML SOPN Inject 0.5 mg into the skin once a week.     . pregabalin (LYRICA) 75 MG capsule Take 75 mg by mouth 2 (two) times daily.    . Vitamin D, Ergocalciferol, (DRISDOL) 50000 units CAPS capsule Take 50,000 Units by mouth every Friday.      No current facility-administered medications for this visit.    Allergies:   Amoxicillin and Fluconazole    ROS:  Please see the history of present illness.   Otherwise, review of systems are positive for none.   All other  systems are reviewed and negative.    PHYSICAL EXAM: VS:  BP (!) 154/90 (BP Location: Right Arm, Patient Position: Sitting, Cuff Size: Large)   Pulse 86   Wt 254 lb (115.2 kg)   SpO2 96%   BMI 39.78 kg/m  , BMI Body mass index is 39.78 kg/m. GENERAL:  Well appearing NECK:  No jugular venous distention, waveform within normal limits, carotid upstroke brisk and symmetric, no bruits, no thyromegaly LUNGS:  Clear to auscultation bilaterally CHEST:  Unremarkable HEART:  PMI not displaced or sustained,S1 and S2 within normal limits, no S3, no S4, no clicks, no rubs, no murmurs ABD:  Flat, positive bowel sounds normal in frequency in pitch, no bruits, no rebound, no guarding, no midline pulsatile mass, no hepatomegaly, no splenomegaly EXT:  2 plus pulses throughout, no edema, no cyanosis no clubbing   EKG:  EKG is not ordered today. The ekg ordered 09/26/2018 for demonstrates Okay I thought Lucy sawSinus rhythm, rate o 82, axis within normal limits, intervals within normal limits, no acute ST-T wave changes.   Recent Labs: 02/08/2020: ALT 23 09/26/2020: BUN 17; Creatinine, Ser 0.74; Hemoglobin 12.1; Magnesium 2.0; Platelets 330; Potassium 3.5; Sodium 138; TSH 1.129    Lipid Panel    Component Value Date/Time   CHOL 114 02/09/2020 0602   TRIG 62 02/09/2020 0602   HDL 51 02/09/2020 0602   CHOLHDL 2.2 02/09/2020 0602   VLDL 12 02/09/2020 0602   LDLCALC 51 02/09/2020 0602      Wt Readings from Last 3 Encounters:  10/17/20 254 lb (115.2 kg)  09/26/20 240 lb (108.9 kg)  02/08/20 266 lb 12.8 oz (121 kg)      Other studies Reviewed: Additional studies/ records that were reviewed today include: Hospital/ED records. Review of the above records demonstrates:  Please see elsewhere in the note.     ASSESSMENT AND PLAN:  PALPITATIONS:    I am going to have her wear a 2-week event monitor.  This would be a long enough duration for her to have these.  I am going to go ahead and give her  25 mg metoprolol tartrate to be taken as needed twice daily for palpitations.  Further management will based on the results of the above.  LEG PAIN: Her leg pain is likely more related to fibromyalgia.  She is also has neuropathy.   Current medicines are reviewed at length with the patient today.  The patient does not have concerns regarding medicines.  The following changes have been made:  no change  Labs/ tests  ordered today include:  No orders of the defined types were placed in this encounter.    Disposition:   FU with me as needed.      Signed, Minus Breeding, MD  10/17/2020 2:45 PM    [facility]

## 2020-10-17 ENCOUNTER — Other Ambulatory Visit: Payer: Self-pay | Admitting: Cardiology

## 2020-10-17 ENCOUNTER — Ambulatory Visit (INDEPENDENT_AMBULATORY_CARE_PROVIDER_SITE_OTHER): Payer: Medicare HMO

## 2020-10-17 ENCOUNTER — Encounter: Payer: Self-pay | Admitting: Cardiology

## 2020-10-17 ENCOUNTER — Ambulatory Visit: Payer: Medicare HMO | Admitting: Cardiology

## 2020-10-17 ENCOUNTER — Telehealth: Payer: Self-pay | Admitting: Cardiology

## 2020-10-17 VITALS — BP 154/90 | HR 86 | Wt 254.0 lb

## 2020-10-17 DIAGNOSIS — R002 Palpitations: Secondary | ICD-10-CM

## 2020-10-17 MED ORDER — METOPROLOL TARTRATE 25 MG PO TABS
ORAL_TABLET | ORAL | 1 refills | Status: DC
Start: 2020-10-17 — End: 2021-01-20

## 2020-10-17 NOTE — Telephone Encounter (Signed)
PERCERT:   14 DAY ZIO  

## 2020-10-17 NOTE — Patient Instructions (Signed)
Your physician recommends that you schedule a follow-up appointment in: [place] physician has recommended you make the following change in your medication:   TAKE LOPRESSOR 25 MG TWICE DAILY AS NEEDED  ZIO MONITOR 14 DAYS   Thank you for choosing [facility]!!

## 2020-11-08 ENCOUNTER — Ambulatory Visit: Payer: Medicare HMO | Admitting: Cardiology

## 2020-11-25 ENCOUNTER — Other Ambulatory Visit: Payer: Self-pay

## 2020-11-25 ENCOUNTER — Emergency Department (HOSPITAL_COMMUNITY)
Admission: EM | Admit: 2020-11-25 | Discharge: 2020-11-25 | Disposition: A | Discharge status: Home / Self Care | Payer: Medicare HMO | Attending: Emergency Medicine | Admitting: Emergency Medicine

## 2020-11-25 ENCOUNTER — Emergency Department (HOSPITAL_COMMUNITY): Payer: Medicare HMO

## 2020-11-25 DIAGNOSIS — G43809 Other migraine, not intractable, without status migrainosus: Secondary | ICD-10-CM | POA: Diagnosis not present

## 2020-11-25 DIAGNOSIS — Z20822 Contact with and (suspected) exposure to covid-19: Secondary | ICD-10-CM | POA: Insufficient documentation

## 2020-11-25 DIAGNOSIS — R519 Headache, unspecified: Secondary | ICD-10-CM | POA: Diagnosis not present

## 2020-11-25 DIAGNOSIS — G43109 Migraine with aura, not intractable, without status migrainosus: Secondary | ICD-10-CM

## 2020-11-25 DIAGNOSIS — H538 Other visual disturbances: Secondary | ICD-10-CM | POA: Diagnosis present

## 2020-11-25 DIAGNOSIS — I10 Essential (primary) hypertension: Secondary | ICD-10-CM | POA: Insufficient documentation

## 2020-11-25 LAB — DIFFERENTIAL
Abs Immature Granulocytes: 0.08 10*3/uL — ABNORMAL HIGH (ref 0.00–0.07)
Basophils Absolute: 0.1 10*3/uL (ref 0.0–0.1)
Basophils Relative: 1 %
Eosinophils Absolute: 0.1 10*3/uL (ref 0.0–0.5)
Eosinophils Relative: 1 %
Immature Granulocytes: 1 %
Lymphocytes Relative: 21 %
Lymphs Abs: 2.6 10*3/uL (ref 0.7–4.0)
Monocytes Absolute: 1 10*3/uL (ref 0.1–1.0)
Monocytes Relative: 8 %
Neutro Abs: 8.4 10*3/uL — ABNORMAL HIGH (ref 1.7–7.7)
Neutrophils Relative %: 68 %

## 2020-11-25 LAB — PROTIME-INR
INR: 1 (ref 0.8–1.2)
Prothrombin Time: 13.3 seconds (ref 11.4–15.2)

## 2020-11-25 LAB — COMPREHENSIVE METABOLIC PANEL
ALT: 21 U/L (ref 0–44)
AST: 18 U/L (ref 15–41)
Albumin: 3.9 g/dL (ref 3.5–5.0)
Alkaline Phosphatase: 89 U/L (ref 38–126)
Anion gap: 9 (ref 5–15)
BUN: 20 mg/dL (ref 8–23)
CO2: 24 mmol/L (ref 22–32)
Calcium: 8.7 mg/dL — ABNORMAL LOW (ref 8.9–10.3)
Chloride: 101 mmol/L (ref 98–111)
Creatinine, Ser: 0.95 mg/dL (ref 0.44–1.00)
GFR, Estimated: >60 mL/min (ref >60)
Glucose, Bld: 100 mg/dL — ABNORMAL HIGH (ref 70–99)
Potassium: 3.3 mmol/L — ABNORMAL LOW (ref 3.5–5.1)
Sodium: 134 mmol/L — ABNORMAL LOW (ref 135–145)
Total Bilirubin: 0.7 mg/dL (ref 0.3–1.2)
Total Protein: 7.5 g/dL (ref 6.5–8.1)

## 2020-11-25 LAB — CBC
HCT: 41.5 % (ref 36.0–46.0)
Hemoglobin: 12.8 g/dL (ref 12.0–15.0)
MCH: 25.6 pg — ABNORMAL LOW (ref 26.0–34.0)
MCHC: 30.8 g/dL (ref 30.0–36.0)
MCV: 83 fL (ref 80.0–100.0)
Platelets: 356 10*3/uL (ref 150–400)
RBC: 5 MIL/uL (ref 3.87–5.11)
RDW: 15.5 % (ref 11.5–15.5)
WBC: 12.3 10*3/uL — ABNORMAL HIGH (ref 4.0–10.5)
nRBC: 0 % (ref 0.0–0.2)

## 2020-11-25 LAB — RESP PANEL BY RT-PCR (FLU A&B, COVID) ARPGX2
Influenza A by PCR: NEGATIVE
Influenza B by PCR: NEGATIVE
SARS Coronavirus 2 by RT PCR: NEGATIVE

## 2020-11-25 LAB — CBG MONITORING, ED: Glucose-Capillary: 99 mg/dL (ref 70–99)

## 2020-11-25 LAB — APTT: aPTT: 22 seconds — ABNORMAL LOW (ref 24–36)

## 2020-11-25 LAB — ETHANOL: Alcohol, Ethyl (B): <10 mg/dL (ref <10)

## 2020-11-25 MED ORDER — KETOROLAC TROMETHAMINE 30 MG/ML IJ SOLN
30.0000 mg | Freq: Once | INTRAMUSCULAR | Status: AC
  Administered 2020-11-25: 30 mg via INTRAVENOUS
  Filled 2020-11-25: qty 1

## 2020-11-25 MED ORDER — PROCHLORPERAZINE EDISYLATE 10 MG/2ML IJ SOLN
10.0000 mg | Freq: Once | INTRAMUSCULAR | Status: AC
  Administered 2020-11-25: 10 mg via INTRAVENOUS
  Filled 2020-11-25: qty 2

## 2020-11-25 NOTE — Consult Note (Signed)
[facility] Telemedicine Consult   Requesting Provider: Coralyn Helling Consult Participants: Bedisde RN, Patient Location of the provider: [facility] Location of the patient: [facility]  This consult was provided via telemedicine with 2-way video and audio communication. The patient/family was informed that care would be provided in this way and agreed to receive care in this manner.    Chief Complaint: Slurred Speech  HPI: 65 yo F with hypertension, h/o TIA who presents with slowed mental processing that started this morning. She states that she did not sleep well last night, estimating that she got about an hour of sleep.  She states that she has insomnia so this is not terribly uncommon for her and the confusion she feels is not normal. She does have a history of migraines, sometimes with visual aura or paresthesia, but neither of the symptoms is present today.  She states that she felt relatively, but not completely normal this morning, but around 10:30 AM she noticed a significant change.  She also complains of headache which is right-sided retro-orbital in location, and associated with photophobia.     LKW: symptoms at 10:30 am, but true LKW unclear.  tpa given?: No, unclear LKW IR Thrombectomy? No, no signs of LVO Modified Rankin Scale: 0-Completely asymptomatic and back to baseline post- stroke Time of teleneurologist reponse: 1:15 pm  Exam: Vitals:   11/25/20 1306  BP: 109/71  Pulse: 91  Resp: 18  Temp: 98.9 F (37.2 C)  SpO2: 100%    General: In bed,   1A: Level of Consciousness - 0 1B: Ask Month and Age - 0 1C: 'Blink Eyes' & 'Squeeze Hands' - 0 2: Test Horizontal Extraocular Movements - 0 3: Test Visual Fields - 0 4: Test Facial Palsy - 0 5A: Test Left Arm Motor Drift - 0 5B: Test Right Arm Motor Drift - 0 6A: Test Left Leg Motor Drift - 0 6B: Test Right Leg Motor Drift - 0 7: Test Limb Ataxia - 0 8: Test Sensation - 0 9:  Test Language/Aphasia- 0 10: Test Dysarthria - 0 11: Test Extinction/Inattention - 0 NIHSS score: 0   Imaging Reviewed: CT Head - negative.   Labs reviewed in epic and pertinent values follow: CBG - 99   Assessment: 65 yo F with subjectively slowed processing with unilateral right retroorbital headache with photophobia.  My suspicion is that this likely represents migraine which is commonly accompanied by mental fog. There is no focal aspect to her exam to make me suspect ischemic stroke, but even if it were, then symptoms would be too mild to merit tPA and the exact timing is slightly unclear.   Recommendations:  1) Treat with migraine cocktail.  2) If symptoms persist, could consider MRI brain, though suspect that it will be low yield.    This patient is receiving care for possible acute neurological changes. There was 45 minutes of care by this provider at the time of service, including time for direct evaluation via telemedicine, review of medical records, imaging studies and discussion of findings with providers, the patient and/or family.  Roland Rack, MD [facility] [phone]  If 7pm- 7am, please page neurology on call as listed in [facility].

## 2020-11-25 NOTE — ED Provider Notes (Signed)
Emergency Department Provider Note   I have reviewed the triage vital signs and the nursing notes.   HISTORY  Chief Complaint Aphasia   HPI Cathy Thomas is a 65 y.o. female with past medical history reviewed below including prior TIA most recently in July 2021 presents to the emergency department with acute onset speech disturbance and vision change.  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  She states at 11:30 AM today she was answering the phone and had difficulty with her speech.  She felt like it was slurred and she was having some difficulty finding the appropriate words to use.  She felt like her vision was somewhat blurry.  She denies any headaches.  She feels generally weak but no unilateral weakness or numbness.  Symptoms are improved slightly but remain.  Denies any chest pain, palpitations, shortness of breath.    Past Medical History:  Diagnosis Date  . Fibromyalgia   . Headache   . Hypertension   . Kidney stone   . TIA (transient ischemic attack)     Patient Active Problem List   Diagnosis Date Noted  . Focal neurological deficit 02/08/2020  . S/P left knee arthroscopy 10/24/19 10/30/2019  . Derangement of posterior horn of medial meniscus of left knee   . Lateral meniscus derangement, left   . Primary osteoarthritis of left knee   . Essential hypertension 02/22/2019  . Hyperlipidemia 02/22/2019  . Bilateral leg pain 02/22/2019  . BV (bacterial vaginosis) 04/24/2015  . Sprain and strain of other specified sites of knee and leg 12/05/2009  . Tendonitis, Achilles 10/08/2008  . Fibromyalgia 07/15/2006  . Plantar fasciitis, bilateral 07/15/2006  . 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 Degeneration of lumbar or lumbosacral intervertebral disc 01/04/2006  . Obesity 01/04/2006  . Osteoarthrosis involving lower leg 01/04/2006  . Sprain of lumbar region 01/04/2006    Past Surgical History:  Procedure Laterality Date  . ABDOMINAL  HYSTERECTOMY    . BALLOON DILATION  12/16/2011   Procedure: BALLOON DILATION;  Surgeon: Marissa Nestle, MD;  Location: [facility];  Service: Urology;  Laterality: Left;  . CESAREAN SECTION    . COLONOSCOPY N/A 01/18/2018   Procedure: COLONOSCOPY;  Surgeon: Daneil Dolin, MD;  Location: [facility];  Service: Endoscopy;  Laterality: N/A;  10:15  . KNEE ARTHROSCOPY    . KNEE ARTHROSCOPY WITH LATERAL MENISECTOMY Left 10/24/2019   Procedure: KNEE ARTHROSCOPY WITH MEDIAL AND LATERAL MENISECTOMY;  Surgeon: Carole Civil, MD;  Location: [facility];  Service: Orthopedics;  Laterality: Left;  . STONE EXTRACTION WITH BASKET  12/16/2011   Procedure: STONE EXTRACTION WITH BASKET;  Surgeon: Marissa Nestle, MD;  Location: [facility];  Service: Urology;  Laterality: Left;    Allergies Amoxicillin and Fluconazole  Family History  Problem Relation Age of Onset  . Cancer Mother        ovarian  . Hypertension Mother   . Heart disease Mother   . Diabetes Mother   . Cancer Father        lung, prostate  . Hypertension Father   . Colon cancer Neg Hx     Social History Social History   Tobacco Use  . Smoking status: Never Smoker  . Smokeless tobacco: Never Used  Vaping Use  . Vaping Use: Never used  Substance Use Topics  . Alcohol use: No  . Drug use: No    Review of Systems  Constitutional: No fever/chills Eyes: Positive  visual changes. ENT: No sore throat. Cardiovascular: Denies chest pain. Respiratory: Denies shortness of breath. Gastrointestinal: No abdominal pain.  No nausea, no vomiting.  No diarrhea.  No constipation. Genitourinary: Negative for dysuria. Musculoskeletal: Negative for back pain. Skin: Negative for rash. Neurological: Negative for headaches, focal weakness or numbness. Positive speech disturbance.   10-point ROS otherwise negative.  ____________________________________________   PHYSICAL EXAM:  VITAL SIGNS: ED Triage Vitals  Enc Vitals Group     BP  11/25/20 1306 109/71     Pulse Rate 11/25/20 1306 91     Resp 11/25/20 1306 18     Temp 11/25/20 1306 98.9 F (37.2 C)     Temp Source 11/25/20 1306 Oral     SpO2 11/25/20 1306 100 %     Weight 11/25/20 1308 253 lb 8.5 oz (115 kg)     Height 11/25/20 1308 5\' 7"  (1.702 m)    Constitutional: Alert and oriented. Well appearing and in no acute distress. Eyes: Conjunctivae are normal. PERRL. EOMI. Head: Atraumatic. Nose: No congestion/rhinnorhea. Mouth/Throat: Mucous membranes are moist. Neck: No stridor.   Cardiovascular: Normal rate, regular rhythm. Good peripheral circulation. Grossly normal heart sounds.   Respiratory: Normal respiratory effort.  No retractions. Lungs CTAB. Gastrointestinal: Soft and nontender. No distention.  Musculoskeletal: No lower extremity tenderness nor edema. No gross deformities of extremities. Neurologic: Speech is slightly hesitant and very mildly slurred at times. No gross focal neurologic deficits are appreciated. 5/5 strength in the upper and lower extremities.  Skin:  Skin is warm, dry and intact. No rash noted.   ____________________________________________   LABS (all labs ordered are listed, but only abnormal results are displayed)  Labs Reviewed  APTT - Abnormal; Notable for the following components:      Result Value   aPTT 22 (*)    All other components within normal limits  CBC - Abnormal; Notable for the following components:   WBC 12.3 (*)    MCH 25.6 (*)    All other components within normal limits  DIFFERENTIAL - Abnormal; Notable for the following components:   Neutro Abs 8.4 (*)    Abs Immature Granulocytes 0.08 (*)    All other components within normal limits  COMPREHENSIVE METABOLIC PANEL - Abnormal; Notable for the following components:   Sodium 134 (*)    Potassium 3.3 (*)    Glucose, Bld 100 (*)    Calcium 8.7 (*)    All other components within normal limits  RESP PANEL BY RT-PCR (FLU A&B, COVID) ARPGX2  ETHANOL   PROTIME-INR  CBG MONITORING, ED   ____________________________________________  EKG   EKG Interpretation  Date/Time:  Monday November 25 2020 13:33:37 EDT Ventricular Rate:  83 PR Interval:  130 QRS Duration: 86 QT Interval:  382 QTC Calculation: 449 R Axis:   41 Text Interpretation: Sinus rhythm Multiple ventricular premature complexes Confirmed by Nanda Quinton [phone]) on 11/26/2020 7:21:01 AM       ____________________________________________  RADIOLOGY  CT HEAD CODE STROKE WO CONTRAST  Result Date: 11/25/2020 CLINICAL DATA:  Code stroke. Neuro deficit, acute, stroke suspected. Additional history provided: New onset lethargy prior to arrival, history of prior stroke per patient. EXAM: CT HEAD WITHOUT CONTRAST TECHNIQUE: Contiguous axial images were obtained from the base of the skull through the vertex without intravenous contrast. COMPARISON:  Prior brain MRI 02/08/2020. Non-contrast head CT and CT angiogram head/neck 02/08/2020. FINDINGS: Brain: Cerebral volume is normal. There is no acute intracranial hemorrhage. No demarcated cortical infarct. No  extra-axial fluid collection. No evidence of intracranial mass. No midline shift. Vascular: No hyperdense vessel. Skull: Normal. Negative for fracture or focal suspicious osseous lesion. Sinuses/Orbits: Visualized orbits show no acute finding. Trace scattered ethmoid sinus mucosal thickening. ASPECTS [facility] Stroke Program Early CT Score) - Ganglionic level infarction (caudate, lentiform nuclei, internal capsule, insula, M1-M3 cortex): 7 - Supraganglionic infarction (M4-M6 cortex): 3 Total score (0-10 with 10 being normal): 10 These results were communicated to Dr. Laverta Baltimore At 1:37 pmon 4/25/2022by text page via the [facility] messaging system. IMPRESSION: No evidence of acute intracranial abnormality.  ASPECTS is 10. Electronically Signed   By: Kellie Simmering DO   On: 11/25/2020 13:38     ____________________________________________   PROCEDURES  Procedure(s) performed:   Procedures  CRITICAL CARE Performed by: Margette Fast Total critical care time: 35 minutes Critical care time was exclusive of separately billable procedures and treating other patients. Critical care was necessary to treat or prevent imminent or life-threatening deterioration. Critical care was time spent personally by me on the following activities: development of treatment plan with patient and/or surrogate as well as nursing, discussions with consultants, evaluation of patient's response to treatment, examination of patient, obtaining history from patient or surrogate, ordering and performing treatments and interventions, ordering and review of laboratory studies, ordering and review of radiographic studies, pulse oximetry and re-evaluation of patient's condition.  Nanda Quinton, MD Emergency Medicine  ____________________________________________   INITIAL IMPRESSION / ASSESSMENT AND PLAN / ED COURSE  Pertinent labs & imaging results that were available during my care of the patient were reviewed by me and considered in my medical decision making (see chart for details).   Patient arrives by private vehicle with acute onset speech disturbance and vision change.  I do not appreciate any focal neurologic deficits but speech does seem slightly slurred.  She has fairly fluent speech but does seem slightly off.  Does have TIA history.  Patient does not feel back to baseline fully.  NIH is low but will activate a code stroke as she is within the timeframe with a history of similar in the past.  No headache at this time to suspect complicated migraine. CODE STROKE activated.   Discussed CT scan head results with radiology.  No acute findings.  After their evaluation spoke with Dr. Leonel Ramsay with neurology.  See his consult note for recommendations but advises that if symptoms resolve with headache  management would defer MRI.  After migraine cocktail me back to evaluate the patient and her symptoms have resolved.  Very low suspicion ultimately for stroke or TIA.  Discussed follow-up with Dr. Merlene Laughter who she has already been in touch with this afternoon.  She will follow-up in the office.  Will defer MRI for now.  ____________________________________________  FINAL CLINICAL IMPRESSION(S) / ED DIAGNOSES  Final diagnoses:  Complicated migraine     MEDICATIONS GIVEN DURING THIS VISIT:  Medications  ketorolac (TORADOL) 30 MG/ML injection 30 mg (30 mg Intravenous Given 11/25/20 1416)  prochlorperazine (COMPAZINE) injection 10 mg (10 mg Intravenous Given 11/25/20 1416)    Note:  This document was prepared using Dragon voice recognition software and may include unintentional dictation errors.  Nanda Quinton, MD, [facility] Emergency Medicine    Mayetta Castleman, [facility], MD 11/26/20 [phone]

## 2020-11-25 NOTE — ED Triage Notes (Signed)
Pt c/o urinary frequency lastnight and states this morning around 1130 she started having slurred speech.

## 2020-11-25 NOTE — ED Notes (Signed)
edp in triage to assess pt.

## 2020-11-25 NOTE — Discharge Instructions (Signed)
You were seen in the emergency department today with slurred speech.  Your CT scan of the head and lab work were normal.  I suspect that this is from a complicated migraine rather than from a stroke.  We have the neurologist see you today who is in agreement.  Please call Dr. Merlene Laughter to schedule a follow-up appointment.  Return to the emergency department immediately and/or call 911 if you develop any new or suddenly worsening symptoms.

## 2021-01-20 ENCOUNTER — Other Ambulatory Visit: Payer: Self-pay | Admitting: Cardiology

## 2021-03-24 ENCOUNTER — Emergency Department (HOSPITAL_COMMUNITY)
Admission: EM | Admit: 2021-03-24 | Discharge: 2021-03-24 | Disposition: A | Discharge status: Home / Self Care | Payer: Medicare HMO | Attending: Emergency Medicine | Admitting: Emergency Medicine

## 2021-03-24 ENCOUNTER — Other Ambulatory Visit: Payer: Self-pay

## 2021-03-24 ENCOUNTER — Encounter (HOSPITAL_COMMUNITY): Payer: Self-pay | Admitting: *Deleted

## 2021-03-24 DIAGNOSIS — I10 Essential (primary) hypertension: Secondary | ICD-10-CM | POA: Diagnosis not present

## 2021-03-24 DIAGNOSIS — Z79899 Other long term (current) drug therapy: Secondary | ICD-10-CM | POA: Diagnosis not present

## 2021-03-24 DIAGNOSIS — M25561 Pain in right knee: Secondary | ICD-10-CM | POA: Diagnosis not present

## 2021-03-24 DIAGNOSIS — M79604 Pain in right leg: Secondary | ICD-10-CM

## 2021-03-24 DIAGNOSIS — Z7982 Long term (current) use of aspirin: Secondary | ICD-10-CM | POA: Diagnosis not present

## 2021-03-24 DIAGNOSIS — M79661 Pain in right lower leg: Secondary | ICD-10-CM | POA: Diagnosis not present

## 2021-03-24 DIAGNOSIS — R600 Localized edema: Secondary | ICD-10-CM

## 2021-03-24 NOTE — ED Provider Notes (Signed)
[facility] EMERGENCY DEPARTMENT Provider Note   CSN: [id number] Arrival date & time: 03/24/21  1606     History Chief Complaint  Patient presents with   Leg Pain    Cathy Thomas is a 65 y.o. female.  HPI  Patient with significant medical history of fibromyalgia, headaches, hypertension, TIA presents to the emergency department with chief complaint of right knee pain and calf pain.  Patient states she has been having right-sided knee pain for last 3 weeks but she started to develop some lower leg pain that started approximately one week ago.  States the pain is mainly in the anterior aspect of her shins, will sometimes go into the back of her leg, she has no associated edema, paresthesia or weakness in that leg.  She denies recent trauma to the area, no history of PEs or DVTs, currently not on hormone therapy.  She denies  chest pain or shortness of breath.  States that she has never had this in the past, she denies history of IV drug use, denies associated fevers or chills.  She denies alleviating factors.  Past Medical History:  Diagnosis Date   Fibromyalgia    Headache    Hypertension    Kidney stone    TIA (transient ischemic attack)     Patient Active Problem List   Diagnosis Date Noted   Focal neurological deficit 02/08/2020   S/P left knee arthroscopy 10/24/19 10/30/2019   Derangement of posterior horn of medial meniscus of left knee    Lateral meniscus derangement, left    Primary osteoarthritis of left knee    Essential hypertension 02/22/2019   Hyperlipidemia 02/22/2019   Bilateral leg pain 02/22/2019   BV (bacterial vaginosis) 04/24/2015   Sprain and strain of other specified sites of knee and leg 12/05/2009   Tendonitis, Achilles 10/08/2008   Fibromyalgia 07/15/2006   Plantar fasciitis, bilateral 07/15/2006   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   Degeneration of lumbar or lumbosacral intervertebral disc 01/04/2006   Obesity 01/04/2006   Osteoarthrosis  involving lower leg 01/04/2006   Sprain of lumbar region 01/04/2006    Past Surgical History:  Procedure Laterality Date   ABDOMINAL HYSTERECTOMY     BALLOON DILATION  12/16/2011   Procedure: BALLOON DILATION;  Surgeon: Marissa Nestle, MD;  Location: [facility];  Service: Urology;  Laterality: Left;   CESAREAN SECTION     COLONOSCOPY N/A 01/18/2018   Procedure: COLONOSCOPY;  Surgeon: Daneil Dolin, MD;  [facility]: [facility];  Service: Endoscopy;  Laterality: N/A;  10:15   KNEE ARTHROSCOPY     KNEE ARTHROSCOPY WITH LATERAL MENISECTOMY Left 10/24/2019   Procedure: KNEE ARTHROSCOPY WITH MEDIAL AND LATERAL MENISECTOMY;  Surgeon: Carole Civil, MD;  Location: [facility];  Service: Orthopedics;  Laterality: Left;   STONE EXTRACTION WITH BASKET  12/16/2011   Procedure: STONE EXTRACTION WITH BASKET;  Surgeon: Marissa Nestle, MD;  Location: [facility];  Service: Urology;  Laterality: Left;     OB History   No obstetric history on file.     Family History  Problem Relation Age of Onset   Cancer Mother        ovarian   Hypertension Mother    Heart disease Mother    Diabetes Mother    Cancer Father        lung, prostate   Hypertension Father    Colon cancer Neg Hx     Social History   Tobacco Use   Smoking status:  Never   Smokeless tobacco: Never  Vaping Use   Vaping Use: Never used  Substance Use Topics   Alcohol use: No   Drug use: No    Home Medications Prior to Admission medications   Medication Sig Start Date End Date Taking? Authorizing Provider  acetaminophen (TYLENOL) 325 MG tablet Take 2 tablets (650 mg total) by mouth every 4 (four) hours as needed for mild pain (or temp > 37.5 C (99.5 F)). 02/09/20   Roxan Hockey, MD  aspirin EC 81 MG tablet Take 1 tablet (81 mg total) by mouth daily with breakfast. Please take Aspirin 81 mg daily along with Plavix 75 mg daily for 30 days then after that STOP the aspirin and continue ONLY Plavix 75 mg daily indefinitely--for  stroke prevention 02/09/20   Roxan Hockey, MD  atorvastatin (LIPITOR) 40 MG tablet Take 1 tablet (40 mg total) by mouth daily. Patient not taking: Reported on 11/25/2020 02/09/20   Roxan Hockey, MD  losartan (COZAAR) 50 MG tablet Take 50 mg by mouth in the morning and at bedtime.     [provider]  Magnesium 500 MG CAPS Take 1 capsule by mouth daily. Patient not taking: Reported on 11/25/2020    [provider]  metoprolol tartrate (LOPRESSOR) 25 MG tablet TAKE (1) TABLET BY MOUTH TWICE A DAY AS NEEDED. 01/20/21   Minus Breeding, MD  OZEMPIC, 0.25 OR 0.5 MG/DOSE, 2 MG/1.5ML SOPN Inject 0.5 mg into the skin once a week.  01/09/20   [provider]  predniSONE (DELTASONE) 20 MG tablet 2 tablets Patient not taking: No sig reported 11/18/20   [provider]  pregabalin (LYRICA) 75 MG capsule Take 150 mg by mouth daily.    [provider]  Vitamin D, Ergocalciferol, (DRISDOL) 50000 units CAPS capsule Take 50,000 Units by mouth every Friday.  Patient not taking: Reported on 11/25/2020    [provider]    Allergies    Amoxicillin and Fluconazole  Review of Systems   Review of Systems  Constitutional:  Negative for chills and fever.  HENT:  Negative for congestion.   Respiratory:  Negative for shortness of breath.   Cardiovascular:  Negative for chest pain.  Gastrointestinal:  Negative for abdominal pain.  Genitourinary:  Negative for enuresis.  Musculoskeletal:  Negative for back pain.       Right knee and leg pain.  Skin:  Negative for rash.  Neurological:  Negative for dizziness.  Hematological:  Does not bruise/bleed easily.   Physical Exam Updated Vital Signs BP 139/76 (BP Location: Right Arm)   Pulse 82   Temp 97.9 F (36.6 C) (Oral)   Resp 18   Ht '5\' 7"'$  (1.702 m)   Wt 113.4 kg   SpO2 100%   BMI 39.16 kg/m   Physical Exam Vitals and nursing note reviewed.  Constitutional:      General: She is not in acute  distress.    Appearance: She is not ill-appearing.  HENT:     Head: Normocephalic and atraumatic.     Nose: No congestion.  Eyes:     Conjunctiva/sclera: Conjunctivae normal.  Cardiovascular:     Rate and Rhythm: Normal rate and regular rhythm.     Pulses: Normal pulses.     Heart sounds: No murmur heard.   No friction rub. No gallop.  Pulmonary:     Effort: No respiratory distress.     Breath sounds: No wheezing, rhonchi or rales.  Musculoskeletal:  Right lower leg: No edema.     Left lower leg: No edema.     Comments: Extremities were visualized there is no unilateral leg swelling present, no erythema or edema noted.  Right lower leg was palpated she had tenderness to palpation on the anterior aspect of her tibia and fibula, she had slight tenderness on the lateral aspect of her tibial plateau, there is no palpable cords present.  There is no joint laxity present in knee, she had full range of motion at her toes ankle and knee, neurovascular fully intact.  Skin:    General: Skin is warm and dry.  Neurological:     Mental Status: She is alert.  Psychiatric:        Mood and Affect: Mood normal.    ED Results / Procedures / Treatments   Labs (all labs ordered are listed, but only abnormal results are displayed) Labs Reviewed - No data to display  EKG None  Radiology No results found.  Procedures Procedures   Medications Ordered in ED Medications - No data to display  ED Course  I have reviewed the triage vital signs and the nursing notes.  Pertinent labs & imaging results that were available during my care of the patient were reviewed by me and considered in my medical decision making (see chart for details).    MDM Rules/Calculators/A&P                          Initial impression-patient presents with right leg pain.  She is alert, does not appear acute stress, vital signs reassuring.  Work-up-due to well-appearing patient, benign physical exam, further lab  or imaging are not warranted at this time.  Rule out- I have low suspicion for septic arthritis as patient denies IV drug use, skin exam was performed no erythematous, edematous, warm joints noted on exam, no new heart murmur heard on exam.  Low suspicion for fracture or dislocation There is no gross deformities present my exam, patient denies trauma to the area, will defer imaging at this time. low suspicion for ligament or tendon damage at the knee area was palpated no gross defects noted, they had full range of motion as well as 5/5 strength.  I have low suspicion for PE as she denies pleuritic chest pain, shortness of breath, vital signs are reassuring.  She has low risk factors. low suspicion for compartment syndrome as area was palpated it was soft to the touch, neurovascular fully intact.   Plan-  Right knee pain- suspect this is acute on chronic, we will have her continue taking over-the-counter pain medications, follow-up with orthopedic surgery for further evaluation. Right lower leg pain suspect this is referred pain from her knee, I have low suspicion for DVT as there is no unilateral leg swelling or palpable cords, will defer anticoags at this time is as risk outweighed the benefits.  will have her come back tomorrow for DVT study.  Vital signs have remained stable, no indication for hospital admission.    Patient given at home care as well strict return precautions.  Patient verbalized that they understood agreed to said plan.  Final Clinical Impression(s) / ED Diagnoses Final diagnoses:  Right leg pain    Rx / DC Orders ED Discharge Orders          Ordered    US Venous Img Lower Unilateral Right        03/24/21 2038  Marcello Fennel, PA-C 03/24/21 2041    Truddie Hidden, MD 03/24/21 [phone]

## 2021-03-24 NOTE — Discharge Instructions (Addendum)
I suspect your leg pain is referred pain from your knee.  I do want you to come back tomorrow for an outpatient DVT study.    I have given the contact information above please call to schedule a appointment.  I want you to follow-up with your orthopedic surgeon for your right knee pain.  Please continue take over-the-counter pain medications.  Come back to the emergency department if you develop chest pain, shortness of breath, severe abdominal pain, uncontrolled nausea, vomiting, diarrhea.

## 2021-03-24 NOTE — ED Triage Notes (Signed)
Pain in right knee x 3 weeks, pain in right leg x 1 week, concerned she may have a blood clot

## 2021-03-25 ENCOUNTER — Ambulatory Visit (HOSPITAL_COMMUNITY)
Admission: RE | Admit: 2021-03-25 | Discharge: 2021-03-25 | Disposition: A | Discharge status: Home / Self Care | Payer: Medicare HMO | Source: Ambulatory Visit | Attending: Student | Admitting: Student

## 2021-03-25 DIAGNOSIS — R600 Localized edema: Secondary | ICD-10-CM | POA: Diagnosis not present

## 2021-07-09 IMAGING — MG MM DIGITAL SCREENING BILAT W/ TOMO AND CAD
8 series · 8 of 24 positions shown · non-contrast
Comparison: Previous exam(s).

CLINICAL DATA: Screening.

EXAM:
DIGITAL SCREENING BILATERAL MAMMOGRAM WITH TOMOSYNTHESIS AND CAD
TECHNIQUE: Bilateral screening digital craniocaudal and mediolateral oblique
mammograms were obtained. Bilateral screening digital breast
tomosynthesis was performed. The images were evaluated with
computer-aided detection.

[R CC synth-2D]
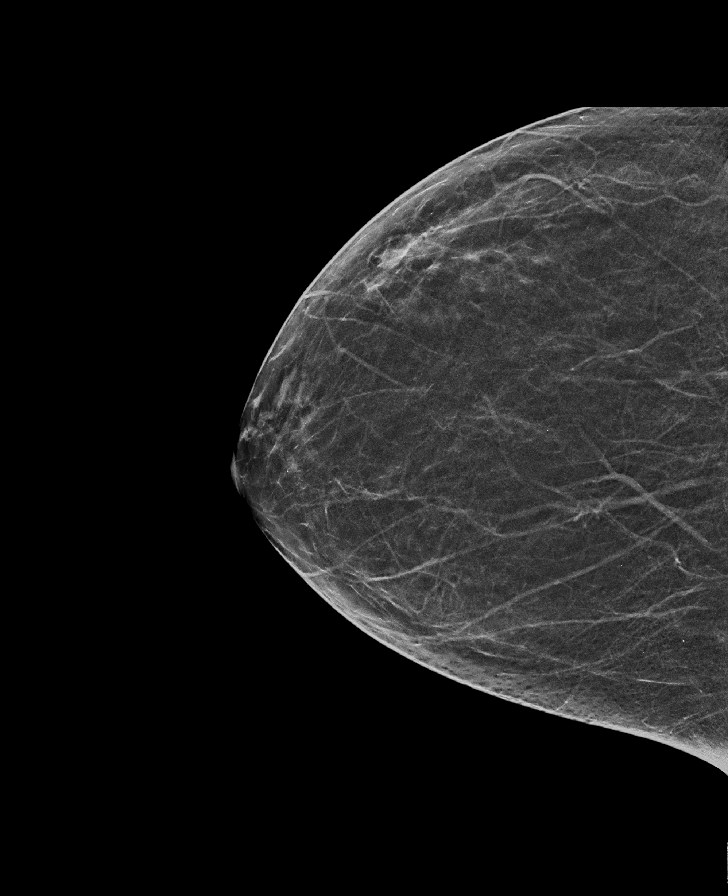

[L CC synth-2D]
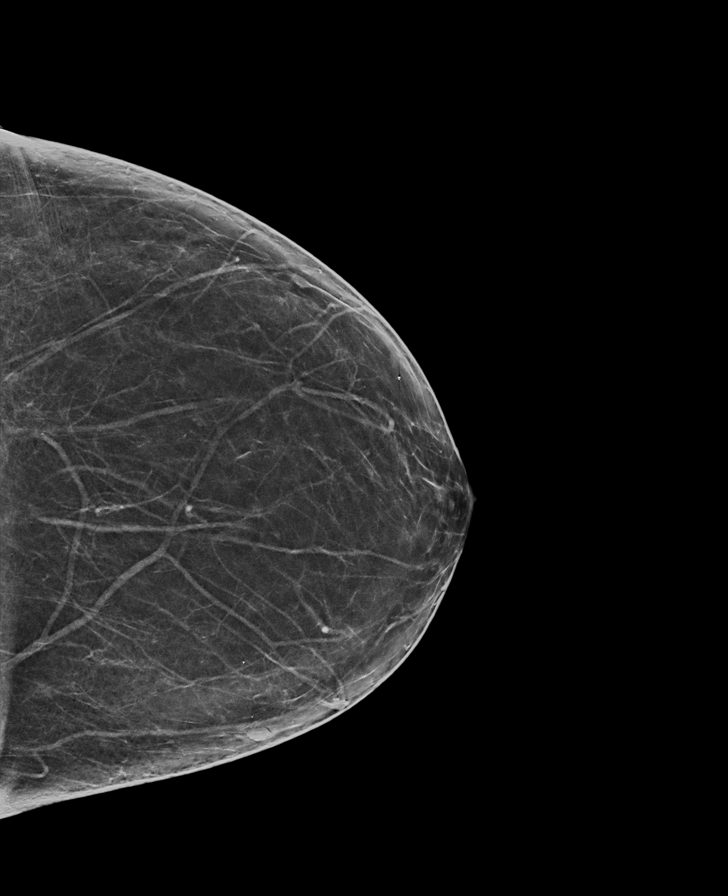

[L MLO synth-2D]
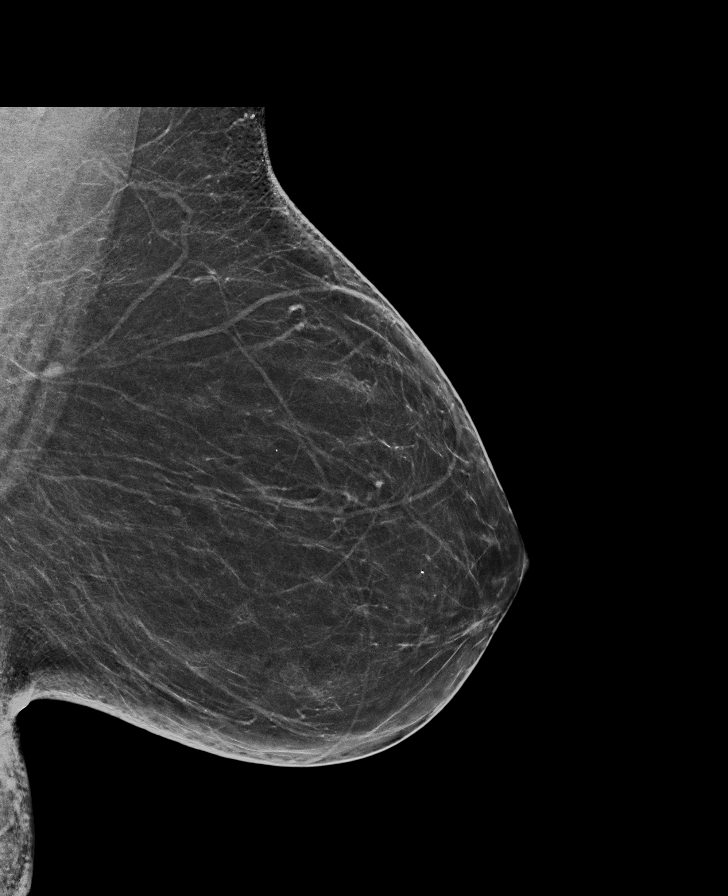

[R MLO synth-2D]
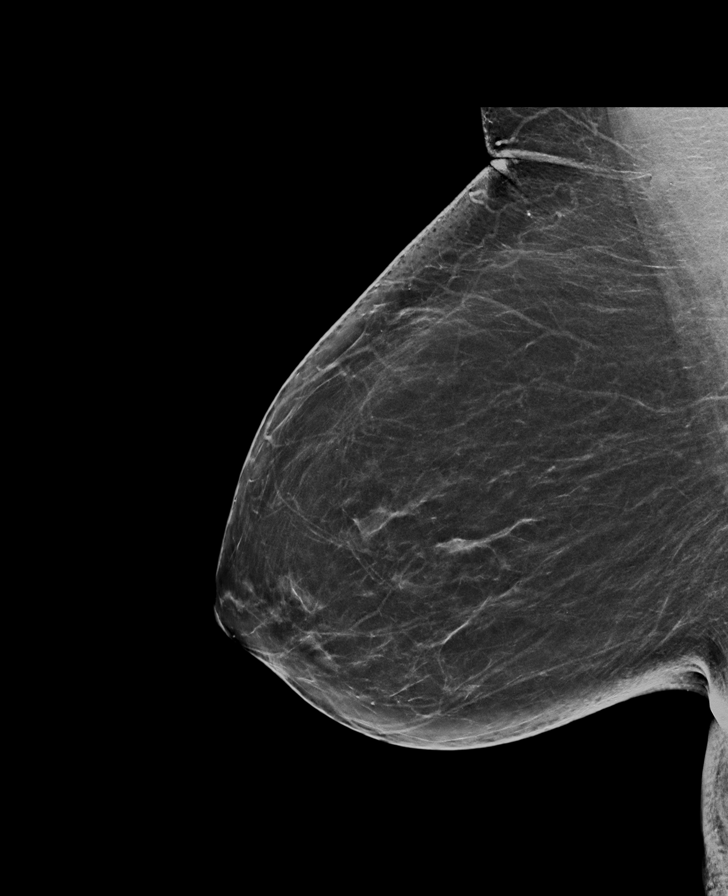

[L MLO tomo · tomo slice 35/70.0]
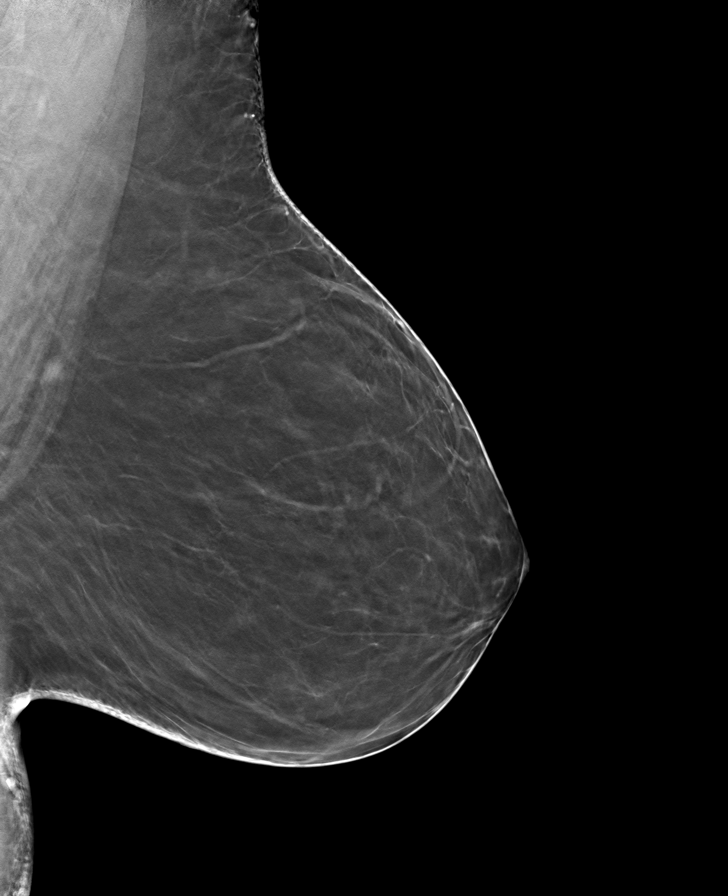

[R MLO tomo · tomo slice 37/72.0]
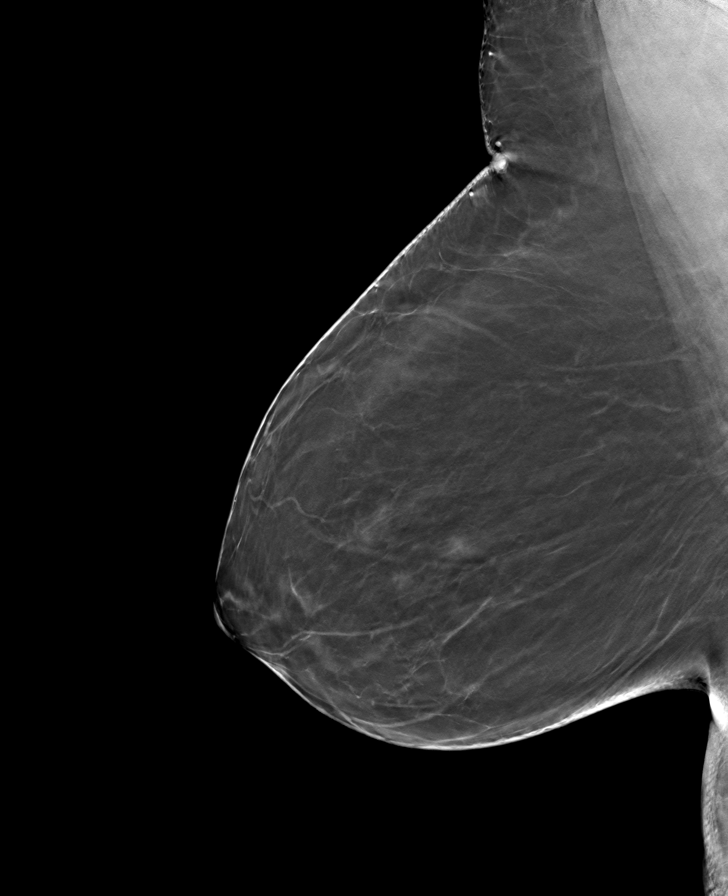

[R CC tomo · tomo slice 29/58.0]
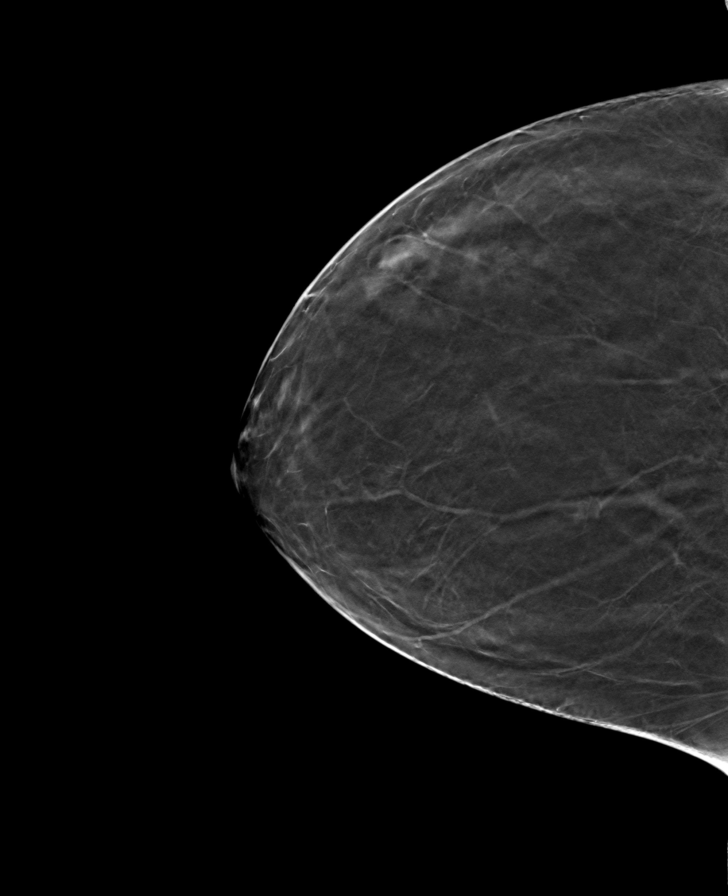

[L CC tomo · tomo slice 33/65.0]
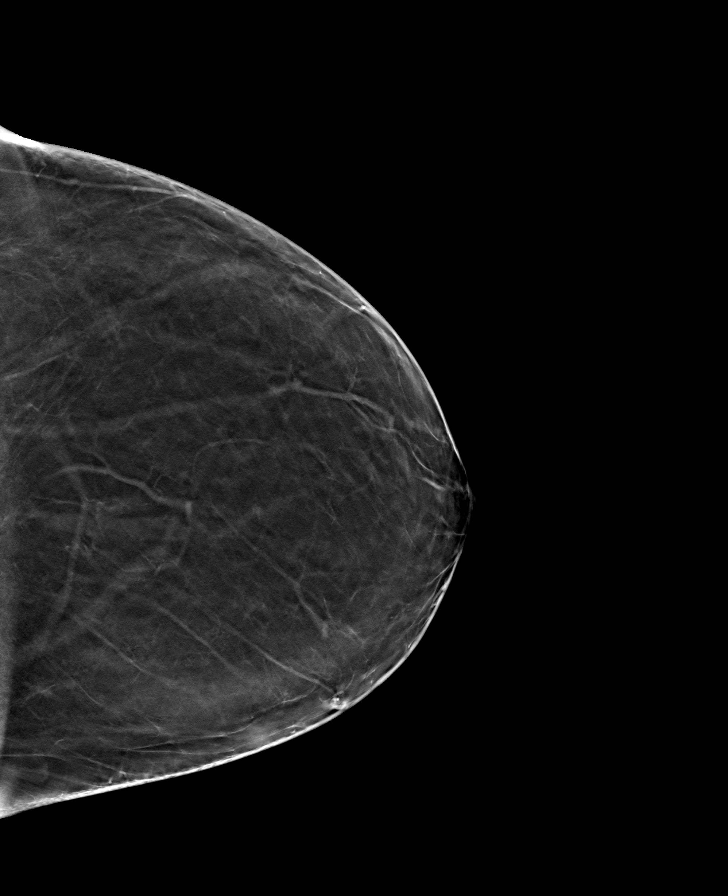

[8 of 24 positions shown; findings below may reference images not displayed]

ACR Breast Density Category b: There are scattered areas of
fibroglandular density.
FINDINGS: There are no findings suspicious for malignancy.
IMPRESSION: No mammographic evidence of malignancy. A result letter of this
screening mammogram will be mailed directly to the patient.

RECOMMENDATION:
Screening mammogram in one year. (Code:51-O-LD2)

BI-RADS CATEGORY  1: Negative.

## 2021-07-12 IMAGING — DX DG CHEST 2V
2 series · 2 of 2 positions shown · non-contrast
Comparison: 01/07/2018

CLINICAL DATA: Palpitation and chest discomfort

EXAM:
CHEST - 2 VIEW

[chest pa]
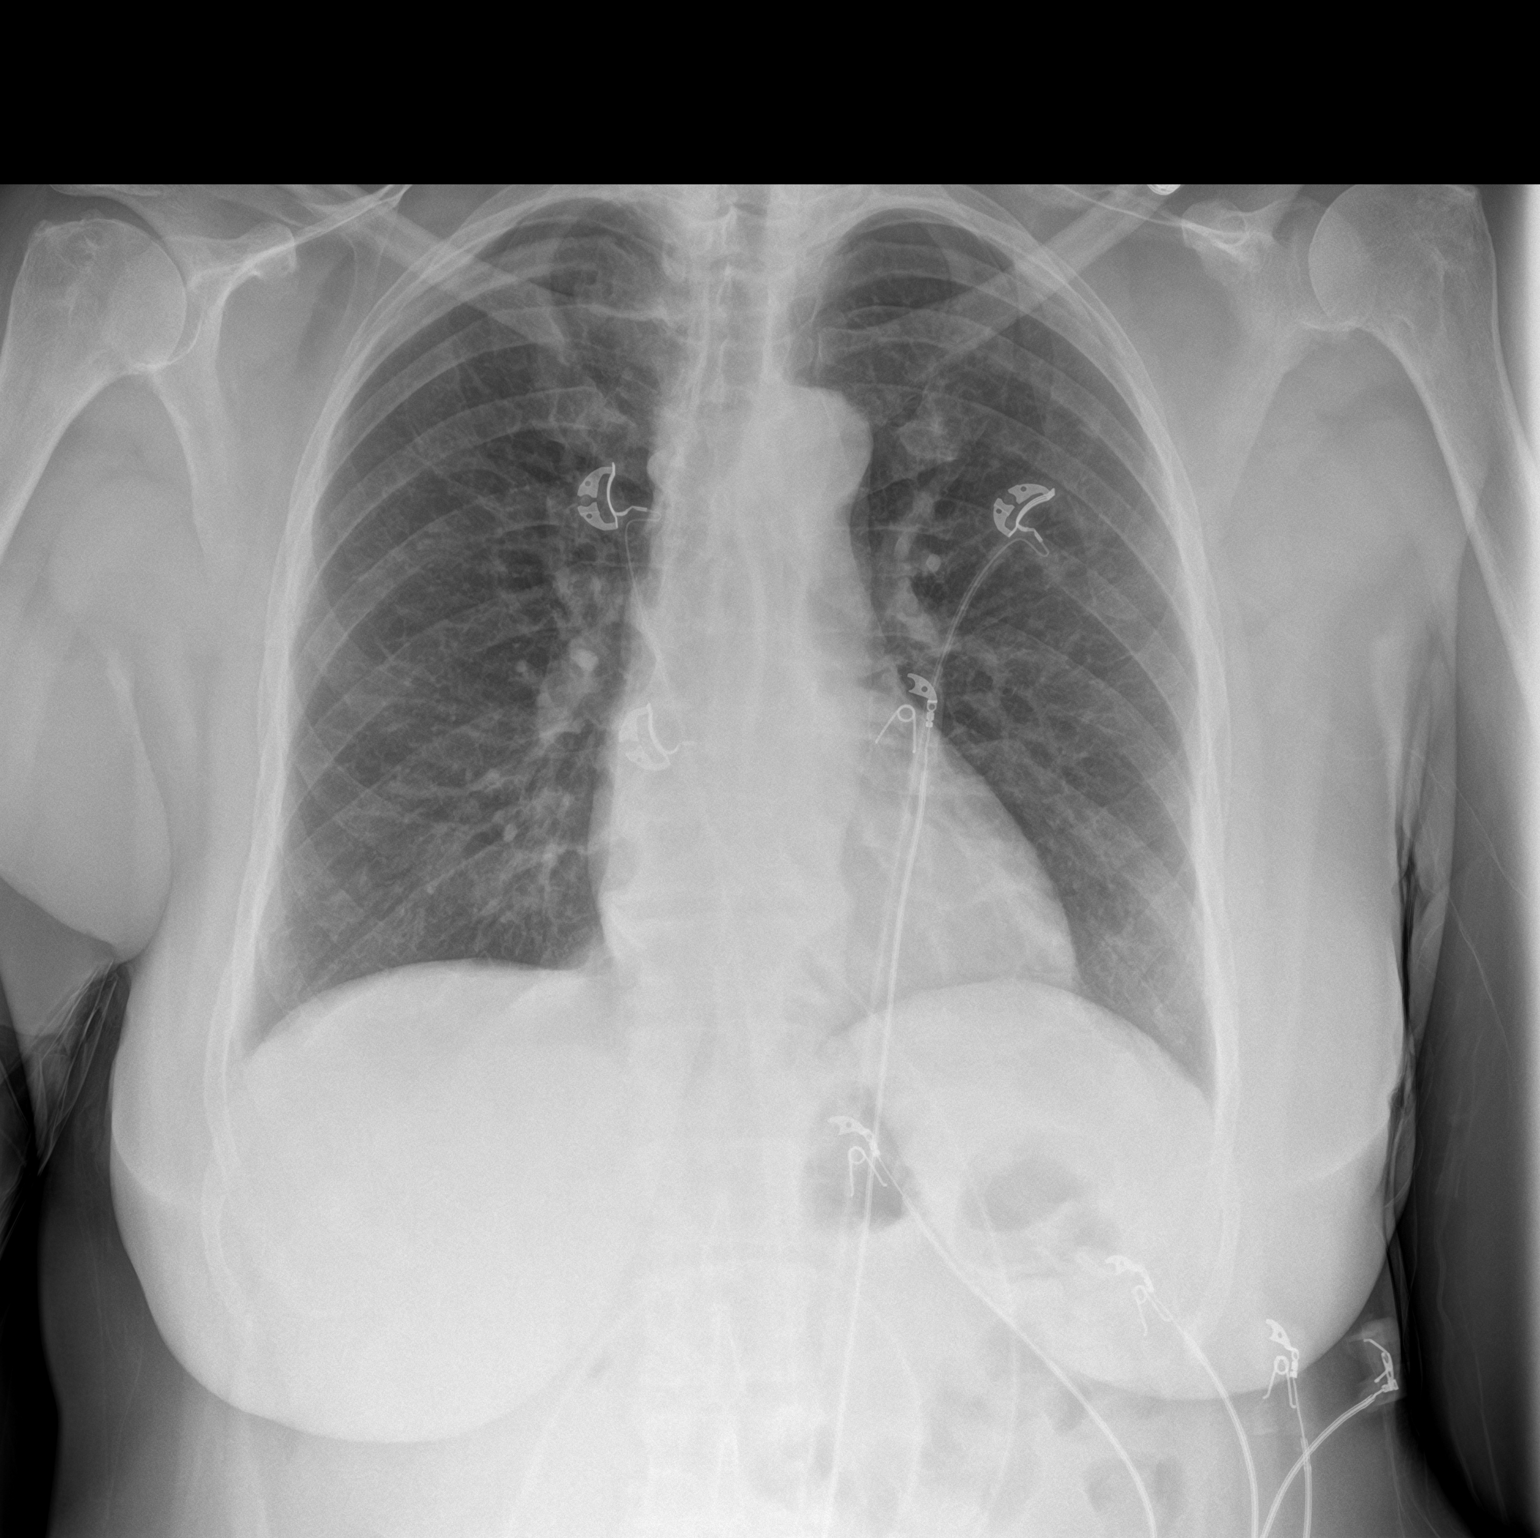

[chest lat]
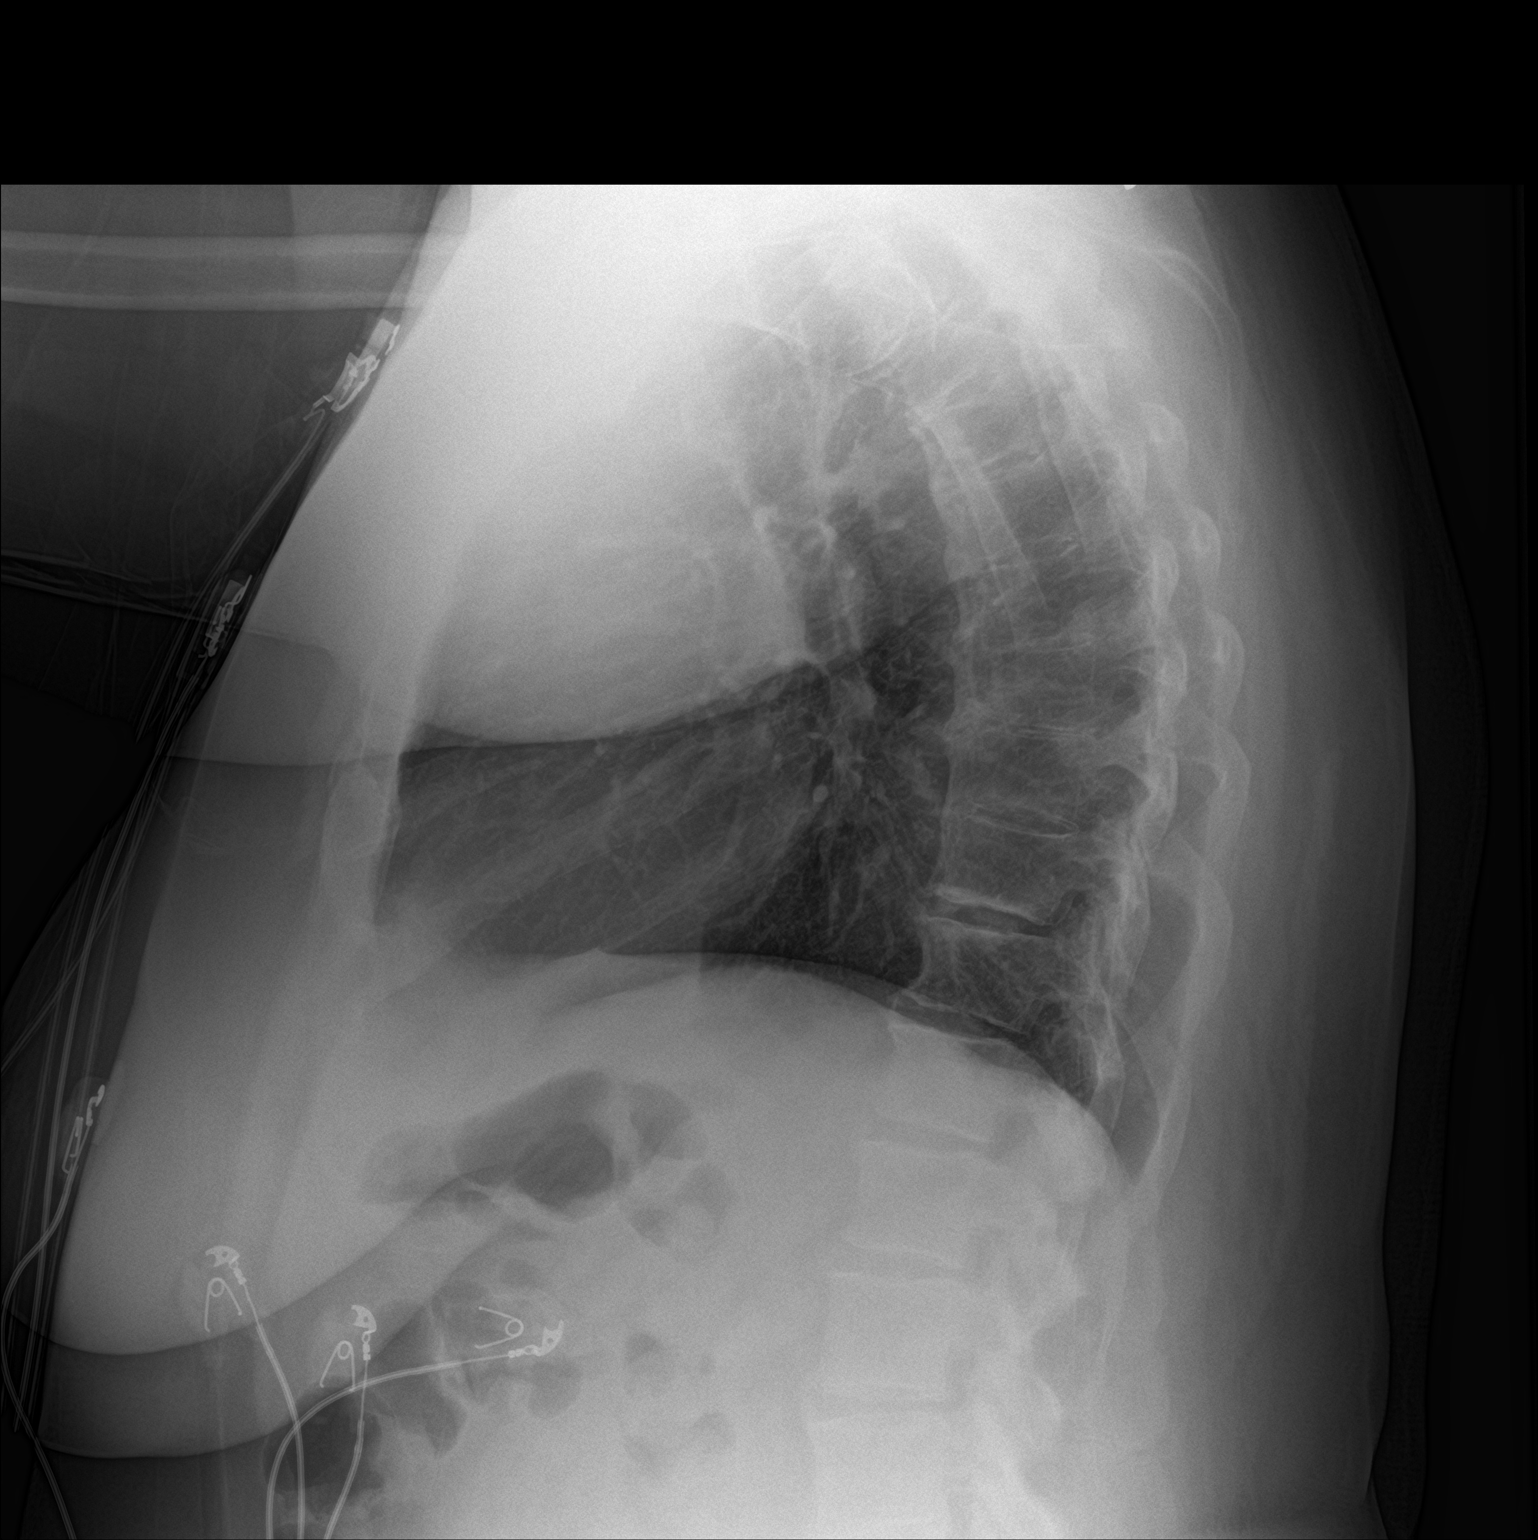

[2 of 2 positions shown; findings below may reference images not displayed]

FINDINGS: The heart size and mediastinal contours are within normal limits.
Both lungs are clear. Degenerative changes of the spine.
IMPRESSION: No active cardiopulmonary disease.

## 2021-08-27 ENCOUNTER — Other Ambulatory Visit: Payer: Self-pay | Admitting: Nurse Practitioner

## 2021-08-27 DIAGNOSIS — Z1231 Encounter for screening mammogram for malignant neoplasm of breast: Secondary | ICD-10-CM

## 2021-09-19 ENCOUNTER — Other Ambulatory Visit (HOSPITAL_COMMUNITY): Payer: Self-pay | Admitting: Nurse Practitioner

## 2021-09-19 DIAGNOSIS — Z1231 Encounter for screening mammogram for malignant neoplasm of breast: Secondary | ICD-10-CM

## 2021-09-24 ENCOUNTER — Other Ambulatory Visit: Payer: Self-pay

## 2021-09-24 ENCOUNTER — Ambulatory Visit (HOSPITAL_COMMUNITY)
Admission: RE | Admit: 2021-09-24 | Discharge: 2021-09-24 | Disposition: A | Discharge status: Home / Self Care | Payer: Medicare HMO | Source: Ambulatory Visit | Attending: Nurse Practitioner | Admitting: Nurse Practitioner

## 2021-09-24 ENCOUNTER — Ambulatory Visit: Payer: Medicare HMO

## 2021-09-24 DIAGNOSIS — Z1231 Encounter for screening mammogram for malignant neoplasm of breast: Secondary | ICD-10-CM | POA: Diagnosis present

## 2022-04-25 ENCOUNTER — Other Ambulatory Visit: Payer: Self-pay

## 2022-04-25 ENCOUNTER — Emergency Department (HOSPITAL_COMMUNITY): Payer: Medicare HMO

## 2022-04-25 ENCOUNTER — Emergency Department (HOSPITAL_COMMUNITY)
Admission: EM | Admit: 2022-04-25 | Discharge: 2022-04-25 | Disposition: A | Discharge status: Home / Self Care | Payer: Medicare HMO | Attending: Emergency Medicine | Admitting: Emergency Medicine

## 2022-04-25 ENCOUNTER — Encounter (HOSPITAL_COMMUNITY): Payer: Self-pay

## 2022-04-25 DIAGNOSIS — R079 Chest pain, unspecified: Secondary | ICD-10-CM | POA: Insufficient documentation

## 2022-04-25 DIAGNOSIS — I10 Essential (primary) hypertension: Secondary | ICD-10-CM | POA: Insufficient documentation

## 2022-04-25 DIAGNOSIS — Z7982 Long term (current) use of aspirin: Secondary | ICD-10-CM | POA: Insufficient documentation

## 2022-04-25 LAB — CBC WITH DIFFERENTIAL/PLATELET
Abs Immature Granulocytes: 0.04 10*3/uL (ref 0.00–0.07)
Basophils Absolute: 0.1 10*3/uL (ref 0.0–0.1)
Basophils Relative: 1 %
Eosinophils Absolute: 0.1 10*3/uL (ref 0.0–0.5)
Eosinophils Relative: 1 %
HCT: 40.5 % (ref 36.0–46.0)
Hemoglobin: 12.6 g/dL (ref 12.0–15.0)
Immature Granulocytes: 0 %
Lymphocytes Relative: 17 %
Lymphs Abs: 1.9 10*3/uL (ref 0.7–4.0)
MCH: 25.6 pg — ABNORMAL LOW (ref 26.0–34.0)
MCHC: 31.1 g/dL (ref 30.0–36.0)
MCV: 82.2 fL (ref 80.0–100.0)
Monocytes Absolute: 0.9 10*3/uL (ref 0.1–1.0)
Monocytes Relative: 8 %
Neutro Abs: 7.8 10*3/uL — ABNORMAL HIGH (ref 1.7–7.7)
Neutrophils Relative %: 73 %
Platelets: 318 10*3/uL (ref 150–400)
RBC: 4.93 MIL/uL (ref 3.87–5.11)
RDW: 15 % (ref 11.5–15.5)
WBC: 10.9 10*3/uL — ABNORMAL HIGH (ref 4.0–10.5)
nRBC: 0 % (ref 0.0–0.2)

## 2022-04-25 LAB — BASIC METABOLIC PANEL
Anion gap: 10 (ref 5–15)
BUN: 13 mg/dL (ref 8–23)
CO2: 24 mmol/L (ref 22–32)
Calcium: 9 mg/dL (ref 8.9–10.3)
Chloride: 106 mmol/L (ref 98–111)
Creatinine, Ser: 0.62 mg/dL (ref 0.44–1.00)
GFR, Estimated: >60 mL/min (ref >60)
Glucose, Bld: 91 mg/dL (ref 70–99)
Potassium: 3.9 mmol/L (ref 3.5–5.1)
Sodium: 140 mmol/L (ref 135–145)

## 2022-04-25 LAB — TROPONIN I (HIGH SENSITIVITY): Troponin I (High Sensitivity): 4 ng/L (ref <18)

## 2022-04-25 MED ORDER — METOPROLOL TARTRATE 25 MG PO TABS: 25.0000 mg | ORAL_TABLET | Freq: Every day | ORAL | 0 refills | Status: AC

## 2022-04-25 MED ORDER — METOPROLOL TARTRATE 25 MG PO TABS
25.0000 mg | ORAL_TABLET | Freq: Once | ORAL | Status: AC
  Administered 2022-04-25: 25 mg via ORAL
  Filled 2022-04-25: qty 1

## 2022-04-25 NOTE — ED Triage Notes (Signed)
Pt from home with c/o chest pain intermittent for a week, "fullness in head behind eyes" x 2 days, and high BP reading yesterday, pt says she just isn't feeling well. Pt reports high BP reading at the dentist yesterday where she was supposed to get bad tooth pulled, but they didn't because of BP reading 165/100. Also reports right knee x months, says she needs knee replacement.

## 2022-04-25 NOTE — ED Provider Notes (Signed)
[facility] EMERGENCY DEPARTMENT Provider Note   CSN: [id number] Arrival date & time: 04/25/22  2102     History Chief Complaint  Patient presents with   multiple complaints    Chest pain, "fullness in head behind eyes, high BP    Cathy Thomas is a 66 y.o. female patient with history of high blood pressure who presents to the emergency department today with a 1 week history of intermittent chest pain primarily when she goes to lay down at night.  Patient also endorsing fullness to her head behind her eyes and higher blood pressure readings.  She states that she was going to get some teeth pulled yesterday at the dentist office but was unable to secondary to her blood pressure.  She takes losartan twice daily.  Chart review reveals that one-point last year she was prescribed Lopressor twice daily.  Patient does not recall taking this medication.  She denies any nausea, vomiting, diarrhea, fever, chills.  HPI     Home Medications Prior to Admission medications   Medication Sig Start Date End Date Taking? Authorizing Provider  acetaminophen (TYLENOL) 325 MG tablet Take 2 tablets (650 mg total) by mouth every 4 (four) hours as needed for mild pain (or temp > 37.5 C (99.5 F)). 02/09/20   Roxan Hockey, MD  aspirin EC 81 MG tablet Take 1 tablet (81 mg total) by mouth daily with breakfast. Please take Aspirin 81 mg daily along with Plavix 75 mg daily for 30 days then after that STOP the aspirin and continue ONLY Plavix 75 mg daily indefinitely--for stroke prevention 02/09/20   Roxan Hockey, MD  atorvastatin (LIPITOR) 40 MG tablet Take 1 tablet (40 mg total) by mouth daily. Patient not taking: Reported on 11/25/2020 02/09/20   Roxan Hockey, MD  losartan (COZAAR) 50 MG tablet Take 50 mg by mouth in the morning and at bedtime.     [provider]  Magnesium 500 MG CAPS Take 1 capsule by mouth daily. Patient not taking: Reported on 11/25/2020    [provider]   metoprolol tartrate (LOPRESSOR) 25 MG tablet TAKE (1) TABLET BY MOUTH TWICE A DAY AS NEEDED. 01/20/21   Minus Breeding, MD  OZEMPIC, 0.25 OR 0.5 MG/DOSE, 2 MG/1.5ML SOPN Inject 0.5 mg into the skin once a week.  01/09/20   [provider]  predniSONE (DELTASONE) 20 MG tablet 2 tablets Patient not taking: No sig reported 11/18/20   [provider]  pregabalin (LYRICA) 75 MG capsule Take 150 mg by mouth daily.    [provider]  Vitamin D, Ergocalciferol, (DRISDOL) 50000 units CAPS capsule Take 50,000 Units by mouth every Friday.  Patient not taking: Reported on 11/25/2020    [provider]      Allergies    Amoxicillin and Fluconazole    Review of Systems   Review of Systems  All other systems reviewed and are negative.   Physical Exam Updated Vital Signs BP (!) 166/94 (BP Location: Right Arm)   Pulse (!) 102   Temp 97.7 F (36.5 C)   Resp 14   Ht '5\' 7"'$  (1.702 m)   Wt 117.9 kg   SpO2 99%   BMI 40.72 kg/m  Physical Exam Vitals and nursing note reviewed.  Constitutional:      General: She is not in acute distress.    Appearance: Normal appearance.  HENT:     Head: Normocephalic and atraumatic.  Eyes:     General:  Right eye: No discharge.        Left eye: No discharge.  Cardiovascular:     Comments: Regular rate and rhythm.  S1/S2 are distinct without any evidence of murmur, rubs, or gallops.  Radial pulses are 2+ bilaterally.  Dorsalis pedis pulses are 2+ bilaterally.  No evidence of pedal edema. Pulmonary:     Comments: Clear to auscultation bilaterally.  Normal effort.  No respiratory distress.  No evidence of wheezes, rales, or rhonchi heard throughout. Abdominal:     General: Abdomen is flat. Bowel sounds are normal. There is no distension.     Tenderness: There is no abdominal tenderness. There is no guarding or rebound.  Musculoskeletal:        General: Normal range of motion.     Cervical back: Neck supple.  Skin:     General: Skin is warm and dry.     Findings: No rash.  Neurological:     General: No focal deficit present.     Mental Status: She is alert.  Psychiatric:        Mood and Affect: Mood normal.        Behavior: Behavior normal.     ED Results / Procedures / Treatments   Labs (all labs ordered are listed, but only abnormal results are displayed) Labs Reviewed  CBC WITH DIFFERENTIAL/PLATELET - Abnormal; Notable for the following components:      Result Value   WBC 10.9 (*)    MCH 25.6 (*)    Neutro Abs 7.8 (*)    All other components within normal limits  BASIC METABOLIC PANEL  TROPONIN I (HIGH SENSITIVITY)    EKG EKG Interpretation  Date/Time:  Saturday April 25 2022 21:31:32 EDT Ventricular Rate:  92 PR Interval:  154 QRS Duration: 90 QT Interval:  378 QTC Calculation: 468 R Axis:   47 Text Interpretation: Sinus rhythm Abnormal R-wave progression, early transition Confirmed by Nanda Quinton [phone]) on 04/25/2022 9:55:51 PM  Radiology DG Chest Portable 1 View  Result Date: 04/25/2022 CLINICAL DATA:  Intermittent chest pain EXAM: PORTABLE CHEST 1 VIEW COMPARISON:  Radiographs 09/26/2020 FINDINGS: No focal consolidation, pleural effusion, or pneumothorax. Normal cardiomediastinal silhouette. No acute osseous abnormality. IMPRESSION: No active disease. Electronically Signed   By: Placido Sou M.D.   On: 04/25/2022 21:43    Procedures Procedures    Medications Ordered in ED Medications  metoprolol tartrate (LOPRESSOR) tablet 25 mg (25 mg Oral Given 04/25/22 2216)    ED Course/ Medical Decision Making/ A&P Clinical Course as of 04/25/22 2258  Sat Apr 25, 2022  2223 CBC with Differential(!) Normal. [CF]  2252 Troponin I (High Sensitivity) Normal. [CF]  0109 Basic metabolic panel Normal. [CF]  2255 DG Chest Portable 1 View I personally ordered and interpreted the study and do not see any evidence of pneumonia or pleural effusion.  I do agree with the radiologist  interpretation. [CF]    Clinical Course User Index [CF] Hendricks Limes, PA-C                           Medical Decision Making Cathy Thomas is a 66 y.o. female patient who presents to the emergency department today for further evaluation of elevated blood pressure and intermittent chest pain.  Patient does not meet criteria for hypertensive urgency or emergency.  At this time I have a low suspicion for ACS.  I will give her 25 mg of Lopressor which  was her prescribed dose.  Heart rate is in the 90s.  I will plan to reassess here shortly.  I will also get some labs in addition to some cardiac enzymes.  EKG was reviewed by myself and I do not see any signs of ST elevation or depression.  No other signs of arrhythmias.  Initial troponin is normal.  I again have a low suspicion for ACS at this time.  I will have her follow-up with their primary care doctor for further evaluation.  Strict return precautions discussed.  She is safe for discharge.  Amount and/or Complexity of Data Reviewed Labs: ordered. Decision-making details documented in ED Course. Radiology: ordered. Decision-making details documented in ED Course.  Risk Prescription drug management.    Final Clinical Impression(s) / ED Diagnoses Final diagnoses:  Chest pain, unspecified type    Rx / DC Orders ED Discharge Orders     None         Cherrie Gauze 04/25/22 2258    Margette Fast, MD 04/26/22 [phone]

## 2022-04-25 NOTE — Discharge Instructions (Signed)
I would like for you to follow-up with your primary care doctor to reevaluate on your blood pressure.  Since it is elevated it might be worth keeping a blood pressure log.  Please check once in the morning and once in the evening.  Do not check it between these times as it will cause it to be falsely elevated.  Please return to the emergency department for any worsening symptoms.

## 2022-09-03 ENCOUNTER — Other Ambulatory Visit (HOSPITAL_COMMUNITY): Payer: Self-pay | Admitting: Nurse Practitioner

## 2022-09-03 DIAGNOSIS — Z1231 Encounter for screening mammogram for malignant neoplasm of breast: Secondary | ICD-10-CM

## 2022-09-05 ENCOUNTER — Other Ambulatory Visit: Payer: Self-pay

## 2022-09-05 ENCOUNTER — Emergency Department (HOSPITAL_COMMUNITY): Payer: Medicare HMO

## 2022-09-05 ENCOUNTER — Emergency Department (HOSPITAL_COMMUNITY)
Admission: EM | Admit: 2022-09-05 | Discharge: 2022-09-05 | Disposition: A | Discharge status: Home / Self Care | Payer: Medicare HMO | Attending: Emergency Medicine | Admitting: Emergency Medicine

## 2022-09-05 ENCOUNTER — Encounter (HOSPITAL_COMMUNITY): Payer: Self-pay | Admitting: *Deleted

## 2022-09-05 DIAGNOSIS — Z7982 Long term (current) use of aspirin: Secondary | ICD-10-CM | POA: Diagnosis not present

## 2022-09-05 DIAGNOSIS — M25512 Pain in left shoulder: Secondary | ICD-10-CM | POA: Diagnosis present

## 2022-09-05 DIAGNOSIS — E876 Hypokalemia: Secondary | ICD-10-CM | POA: Insufficient documentation

## 2022-09-05 LAB — CBC WITH DIFFERENTIAL/PLATELET
Abs Immature Granulocytes: 0.03 10*3/uL (ref 0.00–0.07)
Basophils Absolute: 0.1 10*3/uL (ref 0.0–0.1)
Basophils Relative: 1 %
Eosinophils Absolute: 0.4 10*3/uL (ref 0.0–0.5)
Eosinophils Relative: 4 %
HCT: 37.3 % (ref 36.0–46.0)
Hemoglobin: 11.7 g/dL — ABNORMAL LOW (ref 12.0–15.0)
Immature Granulocytes: 0 %
Lymphocytes Relative: 21 %
Lymphs Abs: 1.8 10*3/uL (ref 0.7–4.0)
MCH: 26 pg (ref 26.0–34.0)
MCHC: 31.4 g/dL (ref 30.0–36.0)
MCV: 82.9 fL (ref 80.0–100.0)
Monocytes Absolute: 0.7 10*3/uL (ref 0.1–1.0)
Monocytes Relative: 8 %
Neutro Abs: 5.4 10*3/uL (ref 1.7–7.7)
Neutrophils Relative %: 66 %
Platelets: 312 10*3/uL (ref 150–400)
RBC: 4.5 MIL/uL (ref 3.87–5.11)
RDW: 14.6 % (ref 11.5–15.5)
WBC: 8.3 10*3/uL (ref 4.0–10.5)
nRBC: 0 % (ref 0.0–0.2)

## 2022-09-05 LAB — BASIC METABOLIC PANEL
Anion gap: 9 (ref 5–15)
BUN: 10 mg/dL (ref 8–23)
CO2: 28 mmol/L (ref 22–32)
Calcium: 8.8 mg/dL — ABNORMAL LOW (ref 8.9–10.3)
Chloride: 104 mmol/L (ref 98–111)
Creatinine, Ser: 0.72 mg/dL (ref 0.44–1.00)
GFR, Estimated: >60 mL/min (ref >60)
Glucose, Bld: 104 mg/dL — ABNORMAL HIGH (ref 70–99)
Potassium: 3.3 mmol/L — ABNORMAL LOW (ref 3.5–5.1)
Sodium: 141 mmol/L (ref 135–145)

## 2022-09-05 LAB — TROPONIN I (HIGH SENSITIVITY): Troponin I (High Sensitivity): 4 ng/L (ref <18)

## 2022-09-05 MED ORDER — POTASSIUM CHLORIDE CRYS ER 20 MEQ PO TBCR
20.0000 meq | EXTENDED_RELEASE_TABLET | Freq: Once | ORAL | Status: AC
  Administered 2022-09-05: 20 meq via ORAL
  Filled 2022-09-05: qty 1

## 2022-09-05 NOTE — Discharge Instructions (Addendum)
You are seen in the emergency department for left shoulder and neck pain.  X-rays of your shoulder showed significant arthritis changes.  Your lab work did not show any evidence of cardiac injury.  Please use ibuprofen 3 times a day and warm compress for your shoulder.  Follow-up with your primary care doctor and also schedule an appointment with rheumatology.  Return to the emergency department if any worsening or concerning symptoms

## 2022-09-05 NOTE — ED Triage Notes (Signed)
Pt with left arm pain, "pulling" since beginning of week.  Pt states right arm stiffness the other week and seen UC.

## 2022-09-05 NOTE — ED Provider Notes (Signed)
[facility] Provider Note   CSN: [id number] Arrival date & time: 09/05/22  1137     History  Chief Complaint  Patient presents with   Arm Pain    Cathy Thomas is a 67 y.o. female.  She is here with a complaint of left shoulder pain that is been going on for the last few days.  She feels a pulling sensation from her neck into her shoulder worse with movement or if she lays on it.  It is not associated with any numbness or weakness.  She said last week it was her right shoulder that was bothering her.  She feels this may be her fibromyalgia, has been on Lyrica for years and feels it is not helping much anymore.  Would like to see a rheumatologist.  She denies any chest pain or shortness of breath diaphoresis nausea vomiting.  No fevers or chills.  No known trauma.  The history is provided by the patient.  Arm Pain This is a new problem. The current episode started 2 days ago. The problem occurs constantly. The problem has not changed since onset.Pertinent negatives include no chest pain, no abdominal pain, no headaches and no shortness of breath. The symptoms are aggravated by bending and twisting. Nothing relieves the symptoms. She has tried rest for the symptoms. The treatment provided no relief.       Home Medications Prior to Admission medications   Medication Sig Start Date End Date Taking? Authorizing Provider  acetaminophen (TYLENOL) 325 MG tablet Take 2 tablets (650 mg total) by mouth every 4 (four) hours as needed for mild pain (or temp > 37.5 C (99.5 F)). 02/09/20   Roxan Hockey, MD  aspirin EC 81 MG tablet Take 1 tablet (81 mg total) by mouth daily with breakfast. Please take Aspirin 81 mg daily along with Plavix 75 mg daily for 30 days then after that STOP the aspirin and continue ONLY Plavix 75 mg daily indefinitely--for stroke prevention 02/09/20   Roxan Hockey, MD  atorvastatin (LIPITOR) 40 MG tablet Take 1 tablet (40 mg  total) by mouth daily. Patient not taking: Reported on 11/25/2020 02/09/20   Roxan Hockey, MD  losartan (COZAAR) 50 MG tablet Take 50 mg by mouth in the morning and at bedtime.     [provider]  Magnesium 500 MG CAPS Take 1 capsule by mouth daily. Patient not taking: Reported on 11/25/2020    [provider]  metoprolol tartrate (LOPRESSOR) 25 MG tablet TAKE (1) TABLET BY MOUTH TWICE A DAY AS NEEDED. 01/20/21   Minus Breeding, MD  metoprolol tartrate (LOPRESSOR) 25 MG tablet Take 1 tablet (25 mg total) by mouth daily. 04/25/22   Fleming, Conner M, PA-C  OZEMPIC, 0.25 OR 0.5 MG/DOSE, 2 MG/1.5ML SOPN Inject 0.5 mg into the skin once a week.  01/09/20   [provider]  predniSONE (DELTASONE) 20 MG tablet 2 tablets Patient not taking: No sig reported 11/18/20   [provider]  pregabalin (LYRICA) 75 MG capsule Take 150 mg by mouth daily.    [provider]  Vitamin D, Ergocalciferol, (DRISDOL) 50000 units CAPS capsule Take 50,000 Units by mouth every Friday.  Patient not taking: Reported on 11/25/2020    [provider]      Allergies    Amoxicillin and Fluconazole    Review of Systems   Review of Systems  Constitutional:  Negative for fever.  HENT:  Negative for sore throat.  Eyes:  Negative for visual disturbance.  Respiratory:  Negative for shortness of breath.   Cardiovascular:  Negative for chest pain.  Gastrointestinal:  Negative for abdominal pain.  Genitourinary:  Negative for dysuria.  Skin:  Negative for rash.  Neurological:  Negative for headaches.    Physical Exam Updated Vital Signs BP (!) 147/76 (BP Location: Right Arm)   Pulse 95   Temp 97.6 F (36.4 C) (Oral)   Resp 18   Ht '5\' 7"'$  (1.702 m)   Wt 117.9 kg   SpO2 98%   BMI 40.72 kg/m  Physical Exam Vitals and nursing note reviewed.  Constitutional:      General: She is not in acute distress.    Appearance: Normal appearance. She is well-developed.  HENT:      Head: Normocephalic and atraumatic.  Eyes:     Conjunctiva/sclera: Conjunctivae normal.  Cardiovascular:     Rate and Rhythm: Normal rate and regular rhythm.     Heart sounds: No murmur heard. Pulmonary:     Effort: Pulmonary effort is normal. No respiratory distress.     Breath sounds: Normal breath sounds.  Abdominal:     Palpations: Abdomen is soft.     Tenderness: There is no abdominal tenderness.  Musculoskeletal:        General: Tenderness present.     Cervical back: Neck supple.     Comments: She has some reproducible tenderness on palpation of her left deltoid area.  She has normal internal/external rotation and normal passive range of motion.  Distal neurovascular intact.  Skin:    General: Skin is warm and dry.     Capillary Refill: Capillary refill takes less than 2 seconds.  Neurological:     General: No focal deficit present.     Mental Status: She is alert.     Sensory: No sensory deficit.     Motor: No weakness.     Gait: Gait normal.     ED Results / Procedures / Treatments   Labs (all labs ordered are listed, but only abnormal results are displayed) Labs Reviewed  BASIC METABOLIC PANEL - Abnormal; Notable for the following components:      Result Value   Potassium 3.3 (*)    Glucose, Bld 104 (*)    Calcium 8.8 (*)    All other components within normal limits  CBC WITH DIFFERENTIAL/PLATELET - Abnormal; Notable for the following components:   Hemoglobin 11.7 (*)    All other components within normal limits  TROPONIN I (HIGH SENSITIVITY)    EKG EKG Interpretation  Date/Time:  Saturday September 05 2022 12:27:27 EST Ventricular Rate:  89 PR Interval:  143 QRS Duration: 100 QT Interval:  389 QTC Calculation: 474 R Axis:   43 Text Interpretation: Sinus rhythm Minimal ST depression, inferior leads No significant change since prior 9/23 Confirmed by Aletta Edouard [phone]) on 09/05/2022 12:33:59 PM  Radiology DG Shoulder Left  Result Date:  09/05/2022 CLINICAL DATA:  Left shoulder pain, no injury EXAM: LEFT SHOULDER - 2+ VIEW COMPARISON:  None Available. FINDINGS: No fracture or dislocation of the left shoulder. 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 Partially imaged left chest unremarkable. IMPRESSION: No fracture or dislocation of the left shoulder. 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 Electronically Signed   By: Delanna Ahmadi M.D.   On: 09/05/2022 13:11    Procedures Procedures    Medications Ordered in ED Medications - No data to display  ED Course/ Medical Decision Making/ A&P Clinical  Course as of 09/05/22 1723  Sat Sep 05, 2022  1300 Left shoulder x-ray does not show any obvious fracture.  Probable arthritic changes AC joint. [MB]    Clinical Course User Index [MB] Hayden Rasmussen, MD                             Medical Decision Making Amount and/or Complexity of Data Reviewed Labs: ordered. Radiology: ordered.  Risk Prescription drug management.   This patient complains of left neck and shoulder pain; this involves an extensive number of treatment Options and is a complaint that carries with it a high risk of complications and morbidity. The differential includes musculoskeletal pain, radiculopathy, dislocation, fracture, ACS, pneumonia, pneumothorax  I ordered, reviewed and interpreted labs, which included CBC normal, hemoglobin slightly lower than priors, chemistries with low potassium, troponin flat I ordered medication oral potassium and reviewed PMP when indicated. I ordered imaging studies which included left shoulder x-ray and I independently    visualized and interpreted imaging which showed degenerative changes Previous records obtained and reviewed recent PCP notes Cardiac monitoring reviewed, normal sinus rhythm Social determinants considered, no significant barriers Critical Interventions: None  After the interventions stated above, I  reevaluated the patient and found patient to be awake alert no distress Admission and further testing considered, no indications for admission at this time.  Recommended continued symptomatic treatment and outpatient follow-up with her orthopedic and primary care doctor.  Return instructions discussed         Final Clinical Impression(s) / ED Diagnoses Final diagnoses:  Acute pain of left shoulder  Hypokalemia    Rx / DC Orders ED Discharge Orders     None         Hayden Rasmussen, MD 09/05/22 1725

## 2022-09-28 ENCOUNTER — Ambulatory Visit (HOSPITAL_COMMUNITY)
Admission: RE | Admit: 2022-09-28 | Discharge: 2022-09-28 | Disposition: A | Discharge status: Home / Self Care | Payer: Medicare HMO | Source: Ambulatory Visit | Attending: Nurse Practitioner | Admitting: Nurse Practitioner

## 2022-09-28 ENCOUNTER — Encounter (HOSPITAL_COMMUNITY): Payer: Self-pay

## 2022-09-28 DIAGNOSIS — Z1231 Encounter for screening mammogram for malignant neoplasm of breast: Secondary | ICD-10-CM | POA: Insufficient documentation

## 2023-02-16 ENCOUNTER — Other Ambulatory Visit: Payer: Self-pay

## 2023-02-16 ENCOUNTER — Emergency Department (HOSPITAL_COMMUNITY)
Admission: EM | Admit: 2023-02-16 | Discharge: 2023-02-16 | Disposition: A | Discharge status: Home / Self Care | Payer: Medicare HMO | Attending: Emergency Medicine | Admitting: Emergency Medicine

## 2023-02-16 ENCOUNTER — Encounter (HOSPITAL_COMMUNITY): Payer: Self-pay

## 2023-02-16 DIAGNOSIS — R55 Syncope and collapse: Secondary | ICD-10-CM

## 2023-02-16 DIAGNOSIS — I10 Essential (primary) hypertension: Secondary | ICD-10-CM | POA: Insufficient documentation

## 2023-02-16 DIAGNOSIS — Z79899 Other long term (current) drug therapy: Secondary | ICD-10-CM | POA: Insufficient documentation

## 2023-02-16 DIAGNOSIS — Z7982 Long term (current) use of aspirin: Secondary | ICD-10-CM | POA: Diagnosis not present

## 2023-02-16 LAB — CBC
HCT: 39.2 % (ref 36.0–46.0)
Hemoglobin: 12.2 g/dL (ref 12.0–15.0)
MCH: 25.5 pg — ABNORMAL LOW (ref 26.0–34.0)
MCHC: 31.1 g/dL (ref 30.0–36.0)
MCV: 81.8 fL (ref 80.0–100.0)
Platelets: 316 10*3/uL (ref 150–400)
RBC: 4.79 MIL/uL (ref 3.87–5.11)
RDW: 14.9 % (ref 11.5–15.5)
WBC: 8.1 10*3/uL (ref 4.0–10.5)
nRBC: 0 % (ref 0.0–0.2)

## 2023-02-16 LAB — BASIC METABOLIC PANEL
Anion gap: 9 (ref 5–15)
BUN: 9 mg/dL (ref 8–23)
CO2: 25 mmol/L (ref 22–32)
Calcium: 9 mg/dL (ref 8.9–10.3)
Chloride: 105 mmol/L (ref 98–111)
Creatinine, Ser: 0.67 mg/dL (ref 0.44–1.00)
GFR, Estimated: >60 mL/min (ref >60)
Glucose, Bld: 84 mg/dL (ref 70–99)
Potassium: 3.4 mmol/L — ABNORMAL LOW (ref 3.5–5.1)
Sodium: 139 mmol/L (ref 135–145)

## 2023-02-16 LAB — COOXEMETRY PANEL
Carboxyhemoglobin: 0.9 % (ref 0.5–1.5)
Methemoglobin: <0.7 % (ref 0.0–1.5)
O2 Saturation: 38.5 %
Total hemoglobin: 12.4 g/dL (ref 12.0–16.0)

## 2023-02-16 LAB — URINALYSIS, ROUTINE W REFLEX MICROSCOPIC
Bilirubin Urine: NEGATIVE
Glucose, UA: NEGATIVE mg/dL
Hgb urine dipstick: NEGATIVE
Ketones, ur: NEGATIVE mg/dL
Leukocytes,Ua: NEGATIVE
Nitrite: NEGATIVE
Protein, ur: NEGATIVE mg/dL
Specific Gravity, Urine: 1.006 (ref 1.005–1.030)
pH: 6 (ref 5.0–8.0)

## 2023-02-16 LAB — CBG MONITORING, ED: Glucose-Capillary: 71 mg/dL (ref 70–99)

## 2023-02-16 MED ORDER — SODIUM CHLORIDE 0.9 % IV BOLUS
500.0000 mL | Freq: Once | INTRAVENOUS | Status: AC
  Administered 2023-02-16: 500 mL via INTRAVENOUS

## 2023-02-16 MED ORDER — POTASSIUM CHLORIDE CRYS ER 20 MEQ PO TBCR
40.0000 meq | EXTENDED_RELEASE_TABLET | Freq: Once | ORAL | Status: AC
  Administered 2023-02-16: 40 meq via ORAL
  Filled 2023-02-16: qty 2

## 2023-02-16 NOTE — ED Triage Notes (Signed)
Pt reports she has been having problems with her AC at home and it has been 100 degrees in her home at times.  Pt reports today while driving back from the PCP office she felt like she was going to pass out and called EMS.

## 2023-02-16 NOTE — Discharge Instructions (Signed)
Please follow up with your primary care provider for recheck. Return to the ER for any worsening or concerning symptoms.

## 2023-02-16 NOTE — ED Provider Notes (Signed)
[place] EMERGENCY DEPARTMENT AT [facility] Provider Note   CSN: [id number] Arrival date & time: 02/16/23  1339     History  Chief Complaint  Patient presents with   Near Syncope    BARBI KUMAGAI is a 67 y.o. female.  Aideen is a 67 yo female with fibromyalgia and HTN presenting today because she felt lightheaded and almost passed out while driving today. She states she has been feeling "off" since Wednesday. Her AC unit broke on Wednesday and she states her house was 100 degrees when she got home from work. She was able to stay at her son's house while this was getting fixed, but thinks she may have overheated. Since Wednesday she has intermittently experienced a headache, "feeling loopy", palpitations, and nausea. She denies any fevers, chest pain, shortness of breath, abdominal pain, or vomiting. She does state she feels like she has been urinating less but denies any dysuria or retention. She does not feel like she has been drinking an adequate amount of water. Is feeling better currently.        Home Medications Prior to Admission medications   Medication Sig Start Date End Date Taking? Authorizing Provider  acetaminophen (TYLENOL) 325 MG tablet Take 2 tablets (650 mg total) by mouth every 4 (four) hours as needed for mild pain (or temp > 37.5 C (99.5 F)). 02/09/20   Shon Hale, MD  aspirin EC 81 MG tablet Take 1 tablet (81 mg total) by mouth daily with breakfast. Please take Aspirin 81 mg daily along with Plavix 75 mg daily for 30 days then after that STOP the aspirin and continue ONLY Plavix 75 mg daily indefinitely--for stroke prevention 02/09/20   Shon Hale, MD  atorvastatin (LIPITOR) 40 MG tablet Take 1 tablet (40 mg total) by mouth daily. Patient not taking: Reported on 11/25/2020 02/09/20   Shon Hale, MD  losartan (COZAAR) 50 MG tablet Take 50 mg by mouth in the morning and at bedtime.     [provider]  Magnesium 500 MG CAPS Take 1  capsule by mouth daily. Patient not taking: Reported on 11/25/2020    [provider]  metoprolol tartrate (LOPRESSOR) 25 MG tablet TAKE (1) TABLET BY MOUTH TWICE A DAY AS NEEDED. 01/20/21   Rollene Rotunda, MD  metoprolol tartrate (LOPRESSOR) 25 MG tablet Take 1 tablet (25 mg total) by mouth daily. 04/25/22   Fleming, Conner M, PA-C  OZEMPIC, 0.25 OR 0.5 MG/DOSE, 2 MG/1.5ML SOPN Inject 0.5 mg into the skin once a week.  01/09/20   [provider]  predniSONE (DELTASONE) 20 MG tablet 2 tablets Patient not taking: No sig reported 11/18/20   [provider]  pregabalin (LYRICA) 75 MG capsule Take 150 mg by mouth daily.    [provider]  Vitamin D, Ergocalciferol, (DRISDOL) 50000 units CAPS capsule Take 50,000 Units by mouth every Friday.  Patient not taking: Reported on 11/25/2020    [provider]      Allergies    Amoxicillin and Fluconazole    Review of Systems   Review of Systems Negative except as per HPI yesterday Physical Exam Updated Vital Signs BP (!) 161/80 (BP Location: Left Arm)   Pulse 72   Temp 97.8 F (36.6 C) (Oral)   Resp 18   Ht 5\' 7"  (1.702 m)   Wt 119.7 kg   SpO2 100%   BMI 41.35 kg/m  Physical Exam Vitals and nursing note reviewed.  Constitutional:  General: She is not in acute distress.    Appearance: She is well-developed. She is not diaphoretic.  HENT:     Head: Normocephalic and atraumatic.     Nose: Nose normal.     Mouth/Throat:     Mouth: Mucous membranes are moist.  Eyes:     Extraocular Movements: Extraocular movements intact.     Pupils: Pupils are equal, round, and reactive to light.  Cardiovascular:     Rate and Rhythm: Normal rate and regular rhythm.     Pulses: Normal pulses.     Heart sounds: Normal heart sounds.  Pulmonary:     Effort: Pulmonary effort is normal.     Breath sounds: Normal breath sounds.  Abdominal:     Palpations: Abdomen is soft.     Tenderness: There is no abdominal  tenderness.  Musculoskeletal:     Cervical back: Neck supple.     Right lower leg: No edema.     Left lower leg: No edema.  Skin:    General: Skin is warm and dry.     Findings: No erythema or rash.  Neurological:     Mental Status: She is alert and oriented to person, place, and time.  Psychiatric:        Behavior: Behavior normal.     ED Results / Procedures / Treatments   Labs (all labs ordered are listed, but only abnormal results are displayed) Labs Reviewed  BASIC METABOLIC PANEL - Abnormal; Notable for the following components:      Result Value   Potassium 3.4 (*)    All other components within normal limits  CBC - Abnormal; Notable for the following components:   MCH 25.5 (*)    All other components within normal limits  URINALYSIS, ROUTINE W REFLEX MICROSCOPIC - Abnormal; Notable for the following components:   Color, Urine STRAW (*)    All other components within normal limits  COOXEMETRY PANEL  CBG MONITORING, ED    EKG None  Radiology No results found.  Procedures Procedures    Medications Ordered in ED Medications  sodium chloride 0.9 % bolus 500 mL (0 mLs Intravenous Stopped 02/16/23 1753)  potassium chloride SA (KLOR-CON M) CR tablet 40 mEq (40 mEq Oral Given 02/16/23 1622)    ED Course/ Medical Decision Making/ A&P                             Medical Decision Making Amount and/or Complexity of Data Reviewed Labs: ordered.  Risk Prescription drug management.   This patient presents to the ED for concern of feeling like she may passout, this involves an extensive number of treatment options, and is a complaint that carries with it a high risk of complications and morbidity.  The differential diagnosis includes but not limited to arrhythmia, metabolic disturbance, carbon monoxide poisoning   Co morbidities that complicate the patient evaluation  Hypertension, hyperlipidemia, fibromyalgia, TIA   Additional history obtained:  Additional  history obtained from son at bedside who contributes to history as above External records from outside source obtained and reviewed including prior labs on file for comparison   Lab Tests:  I Ordered, and personally interpreted labs.  The pertinent results include: BMP with mild hypokalemia with potassium 3.4.  CBC without significant findings.  Urinalysis is unremarkable.  Coox panel negative.   Cardiac Monitoring: / EKG:  The patient was maintained on a cardiac monitor.  I personally viewed  and interpreted the cardiac monitored which showed an underlying rhythm of: Sinus rhythm, rate 71   Problem List / ED Course / Critical interventions / Medication management  67 year old female presents with concern as above.  By time of presentation, is feeling improved.  Provided with IV fluids and feels well and would like to be home with her family.  Labs are reassuring, EKG without ischemic changes.  Patient is discharged with plan to recheck with PCP with return to ER precautions.  She states that the air conditioning in her home has been faxed and should be sufficient at this time. I ordered medication including IV fluids for feeling poorly Reevaluation of the patient after these medicines showed that the patient improved I have reviewed the patients home medicines and have made adjustments as needed   Social Determinants of Health:  Lives alone although has family nearby and has been staying with them   Test / Admission - Considered:  Improved and stable for discharge with plan to recheck with PCP with return to ER precautions.         Final Clinical Impression(s) / ED Diagnoses Final diagnoses:  Near syncope    Rx / DC Orders ED Discharge Orders     None         Jeannie Fend, PA-C 02/16/23 1938    Loetta Rough, MD 02/16/23 2215

## 2023-03-03 ENCOUNTER — Emergency Department (HOSPITAL_COMMUNITY): Payer: Medicare HMO

## 2023-03-03 ENCOUNTER — Emergency Department (HOSPITAL_COMMUNITY)
Admission: EM | Admit: 2023-03-03 | Discharge: 2023-03-04 | Disposition: A | Discharge status: Home / Self Care | Payer: Medicare HMO | Attending: Emergency Medicine | Admitting: Emergency Medicine

## 2023-03-03 ENCOUNTER — Encounter (HOSPITAL_COMMUNITY): Payer: Self-pay | Admitting: Emergency Medicine

## 2023-03-03 DIAGNOSIS — G43909 Migraine, unspecified, not intractable, without status migrainosus: Secondary | ICD-10-CM | POA: Insufficient documentation

## 2023-03-03 DIAGNOSIS — I10 Essential (primary) hypertension: Secondary | ICD-10-CM | POA: Diagnosis not present

## 2023-03-03 DIAGNOSIS — G43009 Migraine without aura, not intractable, without status migrainosus: Secondary | ICD-10-CM

## 2023-03-03 DIAGNOSIS — Z79899 Other long term (current) drug therapy: Secondary | ICD-10-CM | POA: Diagnosis not present

## 2023-03-03 DIAGNOSIS — R519 Headache, unspecified: Secondary | ICD-10-CM | POA: Diagnosis present

## 2023-03-03 DIAGNOSIS — Z7982 Long term (current) use of aspirin: Secondary | ICD-10-CM | POA: Insufficient documentation

## 2023-03-03 LAB — CBC
HCT: 39.5 % (ref 36.0–46.0)
Hemoglobin: 12.4 g/dL (ref 12.0–15.0)
MCH: 25.6 pg — ABNORMAL LOW (ref 26.0–34.0)
MCHC: 31.4 g/dL (ref 30.0–36.0)
MCV: 81.6 fL (ref 80.0–100.0)
Platelets: 301 10*3/uL (ref 150–400)
RBC: 4.84 MIL/uL (ref 3.87–5.11)
RDW: 14.6 % (ref 11.5–15.5)
WBC: 9.2 10*3/uL (ref 4.0–10.5)
nRBC: 0 % (ref 0.0–0.2)

## 2023-03-03 LAB — BASIC METABOLIC PANEL
Anion gap: 9 (ref 5–15)
BUN: 20 mg/dL (ref 8–23)
CO2: 24 mmol/L (ref 22–32)
Calcium: 9.6 mg/dL (ref 8.9–10.3)
Chloride: 106 mmol/L (ref 98–111)
Creatinine, Ser: 0.69 mg/dL (ref 0.44–1.00)
GFR, Estimated: >60 mL/min (ref >60)
Glucose, Bld: 195 mg/dL — ABNORMAL HIGH (ref 70–99)
Potassium: 3.8 mmol/L (ref 3.5–5.1)
Sodium: 139 mmol/L (ref 135–145)

## 2023-03-03 MED ORDER — PROCHLORPERAZINE EDISYLATE 10 MG/2ML IJ SOLN
10.0000 mg | Freq: Once | INTRAMUSCULAR | Status: AC
  Administered 2023-03-04: 10 mg via INTRAVENOUS
  Filled 2023-03-03: qty 2

## 2023-03-03 MED ORDER — KETOROLAC TROMETHAMINE 30 MG/ML IJ SOLN
15.0000 mg | Freq: Once | INTRAMUSCULAR | Status: AC
  Administered 2023-03-04: 15 mg via INTRAVENOUS
  Filled 2023-03-03: qty 1

## 2023-03-03 MED ORDER — SODIUM CHLORIDE 0.9 % IV BOLUS
1000.0000 mL | Freq: Once | INTRAVENOUS | Status: AC
  Administered 2023-03-04: 1000 mL via INTRAVENOUS

## 2023-03-03 MED ORDER — LABETALOL HCL 5 MG/ML IV SOLN
10.0000 mg | Freq: Once | INTRAVENOUS | Status: AC
  Administered 2023-03-04: 10 mg via INTRAVENOUS
  Filled 2023-03-03: qty 4

## 2023-03-03 NOTE — ED Triage Notes (Addendum)
Pt presents POV with complaints of high blood pressure and headache that started this evening. Pt states increased stress today and typically gets headaches with stress. Pt states pain started around 6pm. Pt has history of migraines and TIA. States took vinegar to help with blood pressure and got nauseated after.

## 2023-03-03 NOTE — ED Provider Notes (Signed)
[place] EMERGENCY DEPARTMENT AT [facility] Provider Note   CSN: [id number] Arrival date & time: 03/03/23  2141     History {Add pertinent medical, surgical, social history, OB history to HPI:1} Chief Complaint  Patient presents with   Headache   Migraine   Nausea    Cathy Thomas is a 67 y.o. female.  Patient presents to the emergency department for evaluation of headache.  Patient thinks she is having a migraine.  She used to have migraines but has not had one in a long time.  Patient complaining of a constant pain behind the right eye associated with nausea.  Patient noted that her blood pressure was elevated tonight.  She reports that she has been under a lot of stress on this sometimes happens when she is stressed.       Home Medications Prior to Admission medications   Medication Sig Start Date End Date Taking? Authorizing Provider  acetaminophen (TYLENOL) 325 MG tablet Take 2 tablets (650 mg total) by mouth every 4 (four) hours as needed for mild pain (or temp > 37.5 C (99.5 F)). 02/09/20   Shon Hale, MD  aspirin EC 81 MG tablet Take 1 tablet (81 mg total) by mouth daily with breakfast. Please take Aspirin 81 mg daily along with Plavix 75 mg daily for 30 days then after that STOP the aspirin and continue ONLY Plavix 75 mg daily indefinitely--for stroke prevention 02/09/20   Shon Hale, MD  atorvastatin (LIPITOR) 40 MG tablet Take 1 tablet (40 mg total) by mouth daily. Patient not taking: Reported on 11/25/2020 02/09/20   Shon Hale, MD  losartan (COZAAR) 50 MG tablet Take 50 mg by mouth in the morning and at bedtime.     [provider]  Magnesium 500 MG CAPS Take 1 capsule by mouth daily. Patient not taking: Reported on 11/25/2020    [provider]  metoprolol tartrate (LOPRESSOR) 25 MG tablet TAKE (1) TABLET BY MOUTH TWICE A DAY AS NEEDED. 01/20/21   Rollene Rotunda, MD  metoprolol tartrate (LOPRESSOR) 25 MG tablet Take 1  tablet (25 mg total) by mouth daily. 04/25/22   Fleming, Conner M, PA-C  OZEMPIC, 0.25 OR 0.5 MG/DOSE, 2 MG/1.5ML SOPN Inject 0.5 mg into the skin once a week.  01/09/20   [provider]  predniSONE (DELTASONE) 20 MG tablet 2 tablets Patient not taking: No sig reported 11/18/20   [provider]  pregabalin (LYRICA) 75 MG capsule Take 150 mg by mouth daily.    [provider]  Vitamin D, Ergocalciferol, (DRISDOL) 50000 units CAPS capsule Take 50,000 Units by mouth every Friday.  Patient not taking: Reported on 11/25/2020    [provider]      Allergies    Amoxicillin and Fluconazole    Review of Systems   Review of Systems  Physical Exam Updated Vital Signs BP (!) 191/90   Pulse 95   Temp 99.1 F (37.3 C) (Oral)   Resp 17   SpO2 100%  Physical Exam Vitals and nursing note reviewed.  Constitutional:      General: She is not in acute distress.    Appearance: She is well-developed.  HENT:     Head: Normocephalic and atraumatic.     Mouth/Throat:     Mouth: Mucous membranes are moist.  Eyes:     General: Vision grossly intact. Gaze aligned appropriately.     Extraocular Movements: Extraocular movements intact.     Conjunctiva/sclera: Conjunctivae normal.  Cardiovascular:     Rate and Rhythm: Normal rate and regular rhythm.     Pulses: Normal pulses.     Heart sounds: Normal heart sounds, S1 normal and S2 normal. No murmur heard.    No friction rub. No gallop.  Pulmonary:     Effort: Pulmonary effort is normal. No respiratory distress.     Breath sounds: Normal breath sounds.  Abdominal:     General: Bowel sounds are normal.     Palpations: Abdomen is soft.     Tenderness: There is no abdominal tenderness. There is no guarding or rebound.     Hernia: No hernia is present.  Musculoskeletal:        General: No swelling.     Cervical back: Full passive range of motion without pain, normal range of motion and neck supple. No spinous process  tenderness or muscular tenderness. Normal range of motion.     Right lower leg: No edema.     Left lower leg: No edema.  Skin:    General: Skin is warm and dry.     Capillary Refill: Capillary refill takes less than 2 seconds.     Findings: No ecchymosis, erythema, rash or wound.  Neurological:     General: No focal deficit present.     Mental Status: She is alert and oriented to person, place, and time.     GCS: GCS eye subscore is 4. GCS verbal subscore is 5. GCS motor subscore is 6.     Cranial Nerves: Cranial nerves 2-12 are intact.     Sensory: Sensation is intact.     Motor: Motor function is intact.     Coordination: Coordination is intact.  Psychiatric:        Attention and Perception: Attention normal.        Mood and Affect: Mood normal.        Speech: Speech normal.        Behavior: Behavior normal.     ED Results / Procedures / Treatments   Labs (all labs ordered are listed, but only abnormal results are displayed) Labs Reviewed  BASIC METABOLIC PANEL - Abnormal; Notable for the following components:      Result Value   Glucose, Bld 195 (*)    All other components within normal limits  CBC - Abnormal; Notable for the following components:   MCH 25.6 (*)    All other components within normal limits  URINALYSIS, ROUTINE W REFLEX MICROSCOPIC    EKG None  Radiology No results found.  Procedures Procedures  {Document cardiac monitor, telemetry assessment procedure when appropriate:1}  Medications Ordered in ED Medications  ketorolac (TORADOL) 30 MG/ML injection 15 mg (has no administration in time range)  prochlorperazine (COMPAZINE) injection 10 mg (has no administration in time range)  sodium chloride 0.9 % bolus 1,000 mL (has no administration in time range)    ED Course/ Medical Decision Making/ A&P   {   Click here for ABCD2, HEART and other calculatorsREFRESH Note before signing :1}                              Medical Decision Making Amount  and/or Complexity of Data Reviewed Labs: ordered. Radiology: ordered.  Risk Prescription drug management.   ***  {Document critical care time when appropriate:1} {Document review of labs and clinical decision tools ie heart score, Chads2Vasc2 etc:1}  {Document your independent review of radiology images, and  any outside records:1} {Document your discussion with family members, caretakers, and with consultants:1} {Document social determinants of health affecting pt's care:1} {Document your decision making why or why not admission, treatments were needed:1} Final Clinical Impression(s) / ED Diagnoses Final diagnoses:  None    Rx / DC Orders ED Discharge Orders     None

## 2023-07-21 ENCOUNTER — Other Ambulatory Visit: Payer: Self-pay

## 2023-07-21 ENCOUNTER — Emergency Department (HOSPITAL_COMMUNITY)
Admission: EM | Admit: 2023-07-21 | Discharge: 2023-07-21 | Disposition: A | Discharge status: Home / Self Care | Payer: Medicare HMO | Attending: Emergency Medicine | Admitting: Emergency Medicine

## 2023-07-21 ENCOUNTER — Emergency Department (HOSPITAL_COMMUNITY): Payer: Medicare HMO

## 2023-07-21 ENCOUNTER — Encounter (HOSPITAL_COMMUNITY): Payer: Self-pay

## 2023-07-21 DIAGNOSIS — R42 Dizziness and giddiness: Secondary | ICD-10-CM | POA: Diagnosis not present

## 2023-07-21 DIAGNOSIS — I10 Essential (primary) hypertension: Secondary | ICD-10-CM | POA: Insufficient documentation

## 2023-07-21 DIAGNOSIS — Z7982 Long term (current) use of aspirin: Secondary | ICD-10-CM | POA: Diagnosis not present

## 2023-07-21 DIAGNOSIS — R0602 Shortness of breath: Secondary | ICD-10-CM | POA: Diagnosis not present

## 2023-07-21 DIAGNOSIS — R0789 Other chest pain: Secondary | ICD-10-CM | POA: Insufficient documentation

## 2023-07-21 DIAGNOSIS — R079 Chest pain, unspecified: Secondary | ICD-10-CM

## 2023-07-21 DIAGNOSIS — R5383 Other fatigue: Secondary | ICD-10-CM | POA: Diagnosis present

## 2023-07-21 DIAGNOSIS — Z79899 Other long term (current) drug therapy: Secondary | ICD-10-CM | POA: Diagnosis not present

## 2023-07-21 DIAGNOSIS — R0600 Dyspnea, unspecified: Secondary | ICD-10-CM

## 2023-07-21 DIAGNOSIS — Z8673 Personal history of transient ischemic attack (TIA), and cerebral infarction without residual deficits: Secondary | ICD-10-CM | POA: Diagnosis not present

## 2023-07-21 LAB — BASIC METABOLIC PANEL
Anion gap: 8 (ref 5–15)
BUN: 17 mg/dL (ref 8–23)
CO2: 25 mmol/L (ref 22–32)
Calcium: 8.9 mg/dL (ref 8.9–10.3)
Chloride: 107 mmol/L (ref 98–111)
Creatinine, Ser: 0.66 mg/dL (ref 0.44–1.00)
GFR, Estimated: >60 mL/min (ref >60)
Glucose, Bld: 87 mg/dL (ref 70–99)
Potassium: 3.6 mmol/L (ref 3.5–5.1)
Sodium: 140 mmol/L (ref 135–145)

## 2023-07-21 LAB — CBC
HCT: 38.4 % (ref 36.0–46.0)
Hemoglobin: 11.8 g/dL — ABNORMAL LOW (ref 12.0–15.0)
MCH: 25.5 pg — ABNORMAL LOW (ref 26.0–34.0)
MCHC: 30.7 g/dL (ref 30.0–36.0)
MCV: 82.9 fL (ref 80.0–100.0)
Platelets: 288 10*3/uL (ref 150–400)
RBC: 4.63 MIL/uL (ref 3.87–5.11)
RDW: 14.2 % (ref 11.5–15.5)
WBC: 6.7 10*3/uL (ref 4.0–10.5)
nRBC: 0 % (ref 0.0–0.2)

## 2023-07-21 LAB — TROPONIN I (HIGH SENSITIVITY)
Troponin I (High Sensitivity): 5 ng/L (ref <18)
Troponin I (High Sensitivity): 5 ng/L (ref <18)

## 2023-07-21 LAB — BRAIN NATRIURETIC PEPTIDE: B Natriuretic Peptide: 55 pg/mL (ref 0.0–100.0)

## 2023-07-21 MED ORDER — LIDOCAINE 5 % EX PTCH: 1.0000 | MEDICATED_PATCH | CUTANEOUS | 0 refills | Status: AC

## 2023-07-21 MED ORDER — IOHEXOL 350 MG/ML SOLN
75.0000 mL | Freq: Once | INTRAVENOUS | Status: AC | PRN
  Administered 2023-07-21: 75 mL via INTRAVENOUS

## 2023-07-21 MED ORDER — IBUPROFEN 600 MG PO TABS: 600.0000 mg | ORAL_TABLET | Freq: Four times a day (QID) | ORAL | 0 refills | Status: AC | PRN

## 2023-07-21 NOTE — Discharge Instructions (Signed)
Is a pleasure taking care of you today.  You were seen in the emergency department for fatigue, shortness of breath and chest pain.  Fortunately your workup was very reassuring.  You do not have pneumonia, blood clot, fluid in your lungs, signs of heart attack.  Given your history of high blood pressure and high cholesterol you should follow-up with cardiology for further evaluation and follow-up with your PCP, given that you have had similar pain in the past that improved with anti-inflammatories I am prescribing ibuprofen to see if this helps.  Come back to the ER if you have new or worsening symptoms.

## 2023-07-21 NOTE — ED Provider Notes (Cosign Needed Addendum)
[place] EMERGENCY DEPARTMENT AT [facility] Provider Note   CSN: [id number] Arrival date & time: 07/21/23  1106     History  Chief Complaint  Patient presents with   Shortness of Breath    Cathy Thomas is a 67 y.o. female.  She has PMH of hypertension, high cholesterol, TIA, degenerative disc disease.  Presents the ER for evaluation today of 1 week of fatigue and intermittent chest pain, with shortness of breath that started today worse with walking.  She also states she was having some fluttering in her chest today.  Dizziness or syncope, no diaphoresis.  No nausea or vomiting.  Mild chronic right ankle swelling but no changes illness, no calf swelling or tenderness.  No history of VTE.  Shortness of breath is not worse with lying down.  States she has had this in the past but not for a long time.  She states she thinks it may be due to the fact that she is to exercise a lot and now is relatively inactive.   Shortness of Breath      Home Medications Prior to Admission medications   Medication Sig Start Date End Date Taking? Authorizing Provider  acetaminophen (TYLENOL) 325 MG tablet Take 2 tablets (650 mg total) by mouth every 4 (four) hours as needed for mild pain (or temp > 37.5 C (99.5 F)). 02/09/20   Shon Hale, MD  aspirin EC 81 MG tablet Take 1 tablet (81 mg total) by mouth daily with breakfast. Please take Aspirin 81 mg daily along with Plavix 75 mg daily for 30 days then after that STOP the aspirin and continue ONLY Plavix 75 mg daily indefinitely--for stroke prevention 02/09/20   Shon Hale, MD  atorvastatin (LIPITOR) 40 MG tablet Take 1 tablet (40 mg total) by mouth daily. Patient not taking: Reported on 11/25/2020 02/09/20   Shon Hale, MD  losartan (COZAAR) 50 MG tablet Take 50 mg by mouth in the morning and at bedtime.     [provider]  Magnesium 500 MG CAPS Take 1 capsule by mouth daily. Patient not taking: Reported on 11/25/2020     [provider]  metoprolol tartrate (LOPRESSOR) 25 MG tablet TAKE (1) TABLET BY MOUTH TWICE A DAY AS NEEDED. 01/20/21   Rollene Rotunda, MD  metoprolol tartrate (LOPRESSOR) 25 MG tablet Take 1 tablet (25 mg total) by mouth daily. 04/25/22   Fleming, Conner M, PA-C  OZEMPIC, 0.25 OR 0.5 MG/DOSE, 2 MG/1.5ML SOPN Inject 0.5 mg into the skin once a week.  01/09/20   [provider]  predniSONE (DELTASONE) 20 MG tablet 2 tablets Patient not taking: No sig reported 11/18/20   [provider]  pregabalin (LYRICA) 75 MG capsule Take 150 mg by mouth daily.    [provider]  Vitamin D, Ergocalciferol, (DRISDOL) 50000 units CAPS capsule Take 50,000 Units by mouth every Friday.  Patient not taking: Reported on 11/25/2020    [provider]      Allergies    Amoxicillin and Fluconazole    Review of Systems   Review of Systems  Respiratory:  Positive for shortness of breath.     Physical Exam Updated Vital Signs BP (!) 174/98 Comment: NO BP MEDS TODAY  Pulse 76   Temp 97.7 F (36.5 C) (Oral)   Resp 16   Ht 5\' 7"  (1.702 m)   Wt 120.2 kg   SpO2 100%   BMI 41.50 kg/m  Physical Exam  ED Results /  Procedures / Treatments   Labs (all labs ordered are listed, but only abnormal results are displayed) Labs Reviewed - No data to display  EKG None  Radiology No results found.  Procedures Procedures    Medications Ordered in ED Medications - No data to display  ED Course/ Medical Decision Making/ A&P                                 Medical Decision Making This patient presents to the ED for concern of fatigue and shortness of breath, this involves an extensive number of treatment options, and is a complaint that carries with it a high risk of complications and morbidity.  The differential diagnosis includes ACS, PE, pneumonia, symptomatic anemia, bronchitis, other   Co morbidities that complicate the patient evaluation :   Hypertension,  high cholesterol   Additional history obtained:  Additional history obtained from EMR External records from outside source obtained and reviewed including prior notes including cardiology note from 2022   Lab Tests:  I Ordered, and personally interpreted labs.  The pertinent results include: Troponin negative x 2, BMP is normal, BNP is normal CBC reassuring with hemoglobin 11.8 around patient's usual baseline.   Imaging Studies ordered:  I ordered imaging studies including x-ray asked which shows no pulmonary edema or infiltrate I independently visualized and interpreted imaging within scope of identifying emergent findings  I agree with the radiologist interpretation   Cardiac Monitoring: / EKG:  The patient was maintained on a cardiac monitor.  I personally viewed and interpreted the cardiac monitored which showed an underlying rhythm of: Sinus rhythm     Problem List / ED Course / Critical interventions / Medication management  Chest pain ongoing for weeks leading with fatigue, shortness of breath exertion.  ACS ruled out with troponin, patient does not have exertional chest pain, not having diaphoresis or dizziness.  She is at risk on heart score, she is feeling well at this time.  We discussed her lab findings, normal chest x-ray, PE and other intrathoracic processes ruled out with CT angiogram.  Patient also seen by Dr. Jarold Motto.  Will refer her back to cardiology for further workup.  She can take OTC medication as needed for chest pain will also prescribe Lidoderm patches and have her follow-up with her PCP.  She states that she thinks this feels similar to prior fibromyalgia flares as well so we discussed that anti-inflammatories may be helpful for her pain.  I have reviewed the patients home medicines and have made adjustments as needed    Amount and/or Complexity of Data Reviewed Labs: ordered. Radiology: ordered.  Risk Prescription drug  management.           Final Clinical Impression(s) / ED Diagnoses Final diagnoses:  None    Rx / DC Orders ED Discharge Orders     None         Ma Rings, PA-C 07/21/23 [address] 07/21/23 1829    Rondel Baton, MD 07/22/23 [phone]

## 2023-07-21 NOTE — ED Triage Notes (Signed)
Pt reports cough, nasal congestion, fatigue, exertional dyspnea x 1 week.

## 2023-09-02 ENCOUNTER — Ambulatory Visit: Payer: Medicare HMO | Admitting: Internal Medicine

## 2023-09-13 ENCOUNTER — Other Ambulatory Visit (HOSPITAL_COMMUNITY): Payer: Self-pay | Admitting: Nurse Practitioner

## 2023-09-13 DIAGNOSIS — Z1231 Encounter for screening mammogram for malignant neoplasm of breast: Secondary | ICD-10-CM

## 2023-10-01 ENCOUNTER — Encounter (HOSPITAL_COMMUNITY): Payer: Self-pay

## 2023-10-01 ENCOUNTER — Ambulatory Visit (HOSPITAL_COMMUNITY)
Admission: RE | Admit: 2023-10-01 | Discharge: 2023-10-01 | Disposition: A | Discharge status: Home / Self Care | Payer: Medicare HMO | Source: Ambulatory Visit | Attending: Nurse Practitioner | Admitting: Nurse Practitioner

## 2023-10-01 DIAGNOSIS — Z1231 Encounter for screening mammogram for malignant neoplasm of breast: Secondary | ICD-10-CM | POA: Diagnosis present

## 2024-03-04 ENCOUNTER — Other Ambulatory Visit: Payer: Self-pay

## 2024-03-04 ENCOUNTER — Emergency Department (HOSPITAL_COMMUNITY): Admission: EM | Admit: 2024-03-04 | Discharge: 2024-03-04 | Disposition: A | Discharge status: Home / Self Care

## 2024-03-04 ENCOUNTER — Encounter (HOSPITAL_COMMUNITY): Payer: Self-pay

## 2024-03-04 DIAGNOSIS — H6123 Impacted cerumen, bilateral: Secondary | ICD-10-CM | POA: Diagnosis not present

## 2024-03-04 DIAGNOSIS — I10 Essential (primary) hypertension: Secondary | ICD-10-CM | POA: Insufficient documentation

## 2024-03-04 DIAGNOSIS — R42 Dizziness and giddiness: Secondary | ICD-10-CM

## 2024-03-04 DIAGNOSIS — Z79899 Other long term (current) drug therapy: Secondary | ICD-10-CM | POA: Insufficient documentation

## 2024-03-04 DIAGNOSIS — Z7982 Long term (current) use of aspirin: Secondary | ICD-10-CM | POA: Insufficient documentation

## 2024-03-04 LAB — URINALYSIS, ROUTINE W REFLEX MICROSCOPIC
Bacteria, UA: NONE SEEN
Bilirubin Urine: NEGATIVE
Glucose, UA: NEGATIVE mg/dL
Hgb urine dipstick: NEGATIVE
Ketones, ur: NEGATIVE mg/dL
Nitrite: NEGATIVE
Protein, ur: NEGATIVE mg/dL
Specific Gravity, Urine: 1.011 (ref 1.005–1.030)
pH: 7 (ref 5.0–8.0)

## 2024-03-04 LAB — CBC WITH DIFFERENTIAL/PLATELET
Abs Immature Granulocytes: 0.04 K/uL (ref 0.00–0.07)
Basophils Absolute: 0.1 K/uL (ref 0.0–0.1)
Basophils Relative: 1 %
Eosinophils Absolute: 0.1 K/uL (ref 0.0–0.5)
Eosinophils Relative: 1 %
HCT: 39.1 % (ref 36.0–46.0)
Hemoglobin: 12.3 g/dL (ref 12.0–15.0)
Immature Granulocytes: 1 %
Lymphocytes Relative: 15 %
Lymphs Abs: 1.3 K/uL (ref 0.7–4.0)
MCH: 25.8 pg — ABNORMAL LOW (ref 26.0–34.0)
MCHC: 31.5 g/dL (ref 30.0–36.0)
MCV: 82 fL (ref 80.0–100.0)
Monocytes Absolute: 0.6 K/uL (ref 0.1–1.0)
Monocytes Relative: 7 %
Neutro Abs: 6.4 K/uL (ref 1.7–7.7)
Neutrophils Relative %: 75 %
Platelets: 306 K/uL (ref 150–400)
RBC: 4.77 MIL/uL (ref 3.87–5.11)
RDW: 14 % (ref 11.5–15.5)
WBC: 8.5 K/uL (ref 4.0–10.5)
nRBC: 0 % (ref 0.0–0.2)

## 2024-03-04 LAB — BASIC METABOLIC PANEL WITH GFR
Anion gap: 12 (ref 5–15)
BUN: 13 mg/dL (ref 8–23)
CO2: 24 mmol/L (ref 22–32)
Calcium: 9.2 mg/dL (ref 8.9–10.3)
Chloride: 104 mmol/L (ref 98–111)
Creatinine, Ser: 0.54 mg/dL (ref 0.44–1.00)
GFR, Estimated: >60 mL/min (ref >60)
Glucose, Bld: 91 mg/dL (ref 70–99)
Potassium: 3.7 mmol/L (ref 3.5–5.1)
Sodium: 140 mmol/L (ref 135–145)

## 2024-03-04 MED ORDER — MECLIZINE HCL 25 MG PO TABS: 25.0000 mg | ORAL_TABLET | Freq: Three times a day (TID) | ORAL | 0 refills | Status: AC | PRN

## 2024-03-04 MED ORDER — CARBAMIDE PEROXIDE 6.5 % OT SOLN
5.0000 [drp] | Freq: Once | OTIC | Status: AC
Start: 2024-03-04 — End: 2024-03-04
  Administered 2024-03-04: 5 [drp] via OTIC
  Filled 2024-03-04: qty 15

## 2024-03-04 MED ORDER — MECLIZINE HCL 12.5 MG PO TABS
25.0000 mg | ORAL_TABLET | Freq: Once | ORAL | Status: AC
  Administered 2024-03-04: 25 mg via ORAL
  Filled 2024-03-04: qty 2

## 2024-03-04 MED ORDER — SODIUM CHLORIDE 0.9 % IV BOLUS
1000.0000 mL | Freq: Once | INTRAVENOUS | Status: AC
  Administered 2024-03-04: 1000 mL via INTRAVENOUS

## 2024-03-04 NOTE — ED Triage Notes (Signed)
 Pt stated that she was very dizziness this morning after getting out of bed. Pt stated that she has also been vomiting due to the dizziness. Pt has hx of vertigo but has not had an episode since her stroke 10 years ago

## 2024-03-04 NOTE — Discharge Instructions (Signed)
 You may take over-the-counter Dramamine as directed.  I have sent a prescription for meclizine  to the pharmacy for you as well.  Move slowly when changing positions.  Please follow-up with your primary care provider for recheck, as discussed, please return to the emergency department if you develop any new or worsening symptoms.

## 2024-03-04 NOTE — ED Provider Notes (Signed)
 [place] EMERGENCY DEPARTMENT AT [facility] Provider Note   CSN: [id number] Arrival date & time: 03/04/24  1226     Patient presents with: Dizziness   Cathy Thomas is a 68 y.o. female.  {Add pertinent medical, surgical, social history, OB history to HPI:32947}  Dizziness Associated symptoms: no headaches and no weakness        Cathy Thomas is a 68 y.o. female past medical history of TIAs, hypertension, fibromyalgia, kidney stones and vertigo who presents to the Emergency Department complaining of dizziness and sensation of room spinning upon waking this morning.  Symptoms have been associated with several episodes of vomiting as well.  She states her symptoms are triggered with movement of her eyes or head.  She endorses having vertigo in the past that typically resolves with meclizine , but she states she ran out of this medication.  She states her current symptoms feel similar to previous vertigo episodes, she does state that she has had TIAs in the past but her current symptoms do not feel anything like her previous TIAs. She denies visual changes, headaches, chest pain, shortness of breath, facial or extremity weakness or speech changes.    Prior to Admission medications   Medication Sig Start Date End Date Taking? Authorizing Provider  acetaminophen  (TYLENOL ) 325 MG tablet Take 2 tablets (650 mg total) by mouth every 4 (four) hours as needed for mild pain (or temp > 37.5 C (99.5 F)). 02/09/20   Pearlean Manus, MD  aspirin  EC 81 MG tablet Take 1 tablet (81 mg total) by mouth daily with breakfast. Please take Aspirin  81 mg daily along with Plavix  75 mg daily for 30 days then after that STOP the aspirin  and continue ONLY Plavix  75 mg daily indefinitely--for stroke prevention 02/09/20   Pearlean Manus, MD  atorvastatin  (LIPITOR) 40 MG tablet Take 1 tablet (40 mg total) by mouth daily. Patient not taking: Reported on 11/25/2020 02/09/20   Pearlean Manus, MD  ibuprofen   (ADVIL ) 600 MG tablet Take 1 tablet (600 mg total) by mouth every 6 (six) hours as needed. 07/21/23   Beatty, Celeste A, PA-C  lidocaine  (LIDODERM ) 5 % Place 1 patch onto the skin daily. Remove & Discard patch within 12 hours or as directed by MD 07/21/23   Suellen Cantor A, PA-C  losartan (COZAAR) 50 MG tablet Take 50 mg by mouth in the morning and at bedtime.     [provider]  Magnesium  500 MG CAPS Take 1 capsule by mouth daily. Patient not taking: Reported on 11/25/2020    [provider]  metoprolol  tartrate (LOPRESSOR ) 25 MG tablet TAKE (1) TABLET BY MOUTH TWICE A DAY AS NEEDED. 01/20/21   Lavona Agent, MD  metoprolol  tartrate (LOPRESSOR ) 25 MG tablet Take 1 tablet (25 mg total) by mouth daily. 04/25/22   Fleming, Conner M, PA-C  OZEMPIC, 0.25 OR 0.5 MG/DOSE, 2 MG/1.5ML SOPN Inject 0.5 mg into the skin once a week.  01/09/20   [provider]  predniSONE  (DELTASONE ) 20 MG tablet 2 tablets Patient not taking: No sig reported 11/18/20   [provider]  pregabalin  (LYRICA ) 75 MG capsule Take 150 mg by mouth daily.    [provider]  Vitamin D , Ergocalciferol , (DRISDOL ) 50000 units CAPS capsule Take 50,000 Units by mouth every Friday.  Patient not taking: Reported on 11/25/2020    [provider]    Allergies: Amoxicillin and Fluconazole    Review of Systems  Genitourinary:  Negative for  difficulty urinating and dysuria.  Neurological:  Positive for dizziness. Negative for seizures, syncope, facial asymmetry, speech difficulty, weakness and headaches.    Updated Vital Signs BP (!) 154/76   Pulse 70   Temp 98.6 F (37 C) (Oral)   Resp 16   Ht 5' 7 (1.702 m)   Wt 113.4 kg   SpO2 100%   BMI 39.16 kg/m   Physical Exam  (all labs ordered are listed, but only abnormal results are displayed) Labs Reviewed  CBC WITH DIFFERENTIAL/PLATELET - Abnormal; Notable for the following components:      Result Value   MCH 25.8 (*)    All  other components within normal limits  URINALYSIS, ROUTINE W REFLEX MICROSCOPIC - Abnormal; Notable for the following components:   Leukocytes,Ua LARGE (*)    All other components within normal limits  BASIC METABOLIC PANEL WITH GFR    EKG: None  Radiology: No results found.  {Document cardiac monitor, telemetry assessment procedure when appropriate:32947} Procedures   Medications Ordered in the ED  meclizine  (ANTIVERT ) tablet 25 mg (25 mg Oral Given 03/04/24 1359)  sodium chloride  0.9 % bolus 1,000 mL (1,000 mLs Intravenous New Bag/Given 03/04/24 1359)  carbamide peroxide (DEBROX) 6.5 % OTIC (EAR) solution 5 drop (5 drops Both EARS Given 03/04/24 1437)      {Click here for ABCD2, HEART and other calculators REFRESH Note before signing:1}                              Medical Decision Making Amount and/or Complexity of Data Reviewed Labs: ordered.  Risk OTC drugs.   ***  {Document critical care time when appropriate  Document review of labs and clinical decision tools ie CHADS2VASC2, etc  Document your independent review of radiology images and any outside records  Document your discussion with family members, caretakers and with consultants  Document social determinants of health affecting pt's care  Document your decision making why or why not admission, treatments were needed:32947:::1}   Final diagnoses:  None    ED Discharge Orders     None

## 2024-03-04 NOTE — ED Notes (Signed)
 Pt ambulated to bathroom and reports her dizziness has subsided.

## 2024-03-29 ENCOUNTER — Encounter (HOSPITAL_BASED_OUTPATIENT_CLINIC_OR_DEPARTMENT_OTHER): Payer: Self-pay | Admitting: Internal Medicine

## 2024-03-30 ENCOUNTER — Encounter (HOSPITAL_BASED_OUTPATIENT_CLINIC_OR_DEPARTMENT_OTHER): Payer: Self-pay | Admitting: Internal Medicine

## 2024-03-30 DIAGNOSIS — R0681 Apnea, not elsewhere classified: Secondary | ICD-10-CM

## 2024-03-30 DIAGNOSIS — R0683 Snoring: Secondary | ICD-10-CM

## 2024-08-04 ENCOUNTER — Emergency Department (HOSPITAL_COMMUNITY)
Admission: EM | Admit: 2024-08-04 | Discharge: 2024-08-04 | Disposition: A | Discharge status: Home / Self Care | Attending: Emergency Medicine | Admitting: Emergency Medicine

## 2024-08-04 ENCOUNTER — Other Ambulatory Visit: Payer: Self-pay

## 2024-08-04 ENCOUNTER — Emergency Department (HOSPITAL_COMMUNITY)

## 2024-08-04 ENCOUNTER — Encounter (HOSPITAL_COMMUNITY): Payer: Self-pay

## 2024-08-04 DIAGNOSIS — J101 Influenza due to other identified influenza virus with other respiratory manifestations: Secondary | ICD-10-CM | POA: Diagnosis not present

## 2024-08-04 DIAGNOSIS — R000 Tachycardia, unspecified: Secondary | ICD-10-CM | POA: Diagnosis not present

## 2024-08-04 DIAGNOSIS — R059 Cough, unspecified: Secondary | ICD-10-CM | POA: Diagnosis present

## 2024-08-04 DIAGNOSIS — Z7982 Long term (current) use of aspirin: Secondary | ICD-10-CM | POA: Insufficient documentation

## 2024-08-04 LAB — CBC WITH DIFFERENTIAL/PLATELET
Abs Immature Granulocytes: 0.09 K/uL — ABNORMAL HIGH (ref 0.00–0.07)
Basophils Absolute: 0 K/uL (ref 0.0–0.1)
Basophils Relative: 0 %
Eosinophils Absolute: 0 K/uL (ref 0.0–0.5)
Eosinophils Relative: 0 %
HCT: 39.9 % (ref 36.0–46.0)
Hemoglobin: 12.6 g/dL (ref 12.0–15.0)
Immature Granulocytes: 1 %
Lymphocytes Relative: 3 %
Lymphs Abs: 0.3 K/uL — ABNORMAL LOW (ref 0.7–4.0)
MCH: 25.8 pg — ABNORMAL LOW (ref 26.0–34.0)
MCHC: 31.6 g/dL (ref 30.0–36.0)
MCV: 81.8 fL (ref 80.0–100.0)
Monocytes Absolute: 1.4 K/uL — ABNORMAL HIGH (ref 0.1–1.0)
Monocytes Relative: 15 %
Neutro Abs: 7.5 K/uL (ref 1.7–7.7)
Neutrophils Relative %: 81 %
Platelets: 245 K/uL (ref 150–400)
RBC: 4.88 MIL/uL (ref 3.87–5.11)
RDW: 14.6 % (ref 11.5–15.5)
WBC: 9.3 K/uL (ref 4.0–10.5)
nRBC: 0 % (ref 0.0–0.2)

## 2024-08-04 LAB — COMPREHENSIVE METABOLIC PANEL WITH GFR
ALT: 12 U/L (ref 0–44)
AST: 19 U/L (ref 15–41)
Albumin: 3.9 g/dL (ref 3.5–5.0)
Alkaline Phosphatase: 112 U/L (ref 38–126)
Anion gap: 8 (ref 5–15)
BUN: 15 mg/dL (ref 8–23)
CO2: 28 mmol/L (ref 22–32)
Calcium: 9 mg/dL (ref 8.9–10.3)
Chloride: 101 mmol/L (ref 98–111)
Creatinine, Ser: 0.87 mg/dL (ref 0.44–1.00)
GFR, Estimated: >60 mL/min
Glucose, Bld: 83 mg/dL (ref 70–99)
Potassium: 3.8 mmol/L (ref 3.5–5.1)
Sodium: 137 mmol/L (ref 135–145)
Total Bilirubin: 0.7 mg/dL (ref 0.0–1.2)
Total Protein: 7 g/dL (ref 6.5–8.1)

## 2024-08-04 LAB — RESP PANEL BY RT-PCR (RSV, FLU A&B, COVID)  RVPGX2
Influenza A by PCR: POSITIVE — AB
Influenza B by PCR: NEGATIVE
Resp Syncytial Virus by PCR: NEGATIVE
SARS Coronavirus 2 by RT PCR: NEGATIVE

## 2024-08-04 LAB — LACTIC ACID, PLASMA: Lactic Acid, Venous: 1.3 mmol/L (ref 0.5–1.9)

## 2024-08-04 MED ORDER — LABETALOL HCL 5 MG/ML IV SOLN
10.0000 mg | Freq: Once | INTRAVENOUS | Status: AC
  Administered 2024-08-04: 10 mg via INTRAVENOUS
  Filled 2024-08-04: qty 4

## 2024-08-04 MED ORDER — ACETAMINOPHEN 325 MG PO TABS
650.0000 mg | ORAL_TABLET | Freq: Once | ORAL | Status: AC
  Administered 2024-08-04: 650 mg via ORAL
  Filled 2024-08-04: qty 2

## 2024-08-04 MED ORDER — OSELTAMIVIR PHOSPHATE 75 MG PO CAPS: 75.0000 mg | ORAL_CAPSULE | Freq: Two times a day (BID) | ORAL | 0 refills | Status: AC

## 2024-08-04 MED ORDER — OSELTAMIVIR PHOSPHATE 75 MG PO CAPS
75.0000 mg | ORAL_CAPSULE | Freq: Once | ORAL | Status: AC
  Administered 2024-08-04: 75 mg via ORAL
  Filled 2024-08-04: qty 1

## 2024-08-04 MED ORDER — IBUPROFEN 400 MG PO TABS
400.0000 mg | ORAL_TABLET | Freq: Once | ORAL | Status: AC
  Administered 2024-08-04: 400 mg via ORAL
  Filled 2024-08-04: qty 1

## 2024-08-04 MED ORDER — LACTATED RINGERS IV BOLUS
1000.0000 mL | Freq: Once | INTRAVENOUS | Status: AC
  Administered 2024-08-04: 1000 mL via INTRAVENOUS

## 2024-08-04 NOTE — ED Provider Notes (Signed)
 " [place] EMERGENCY DEPARTMENT AT [facility] Provider Note   CSN: [id number] Arrival date & time: 08/04/24  0045     Patient presents with: No chief complaint on file.   Cathy Thomas is a 69 y.o. female.   Resents to the emergency department for evaluation of generalized weakness.  Patient has had a harsh, deep cough, malaise, generalized weakness for more than 1 day.  Patient reports that she was trying to go to the bathroom tonight and her weakness worsened, causing her to fall to the ground.  She did not injure herself but could not get back up.  She comes to the emergency department by EMS.  Patient denies chest pain.  She is not short of breath.  She has not had any vomiting or diarrhea.       Prior to Admission medications  Medication Sig Start Date End Date Taking? Authorizing Provider  oseltamivir (TAMIFLU) 75 MG capsule Take 1 capsule (75 mg total) by mouth every 12 (twelve) hours. 08/04/24  Yes Ionia Schey, Lonni PARAS, MD  acetaminophen  (TYLENOL ) 325 MG tablet Take 2 tablets (650 mg total) by mouth every 4 (four) hours as needed for mild pain (or temp > 37.5 C (99.5 F)). 02/09/20   Pearlean Manus, MD  aspirin  EC 81 MG tablet Take 1 tablet (81 mg total) by mouth daily with breakfast. Please take Aspirin  81 mg daily along with Plavix  75 mg daily for 30 days then after that STOP the aspirin  and continue ONLY Plavix  75 mg daily indefinitely--for stroke prevention 02/09/20   Pearlean Manus, MD  atorvastatin  (LIPITOR) 40 MG tablet Take 1 tablet (40 mg total) by mouth daily. Patient not taking: Reported on 11/25/2020 02/09/20   Pearlean Manus, MD  ibuprofen  (ADVIL ) 600 MG tablet Take 1 tablet (600 mg total) by mouth every 6 (six) hours as needed. 07/21/23   Suellen Cantor A, PA-C  lidocaine  (LIDODERM ) 5 % Place 1 patch onto the skin daily. Remove & Discard patch within 12 hours or as directed by MD 07/21/23   Suellen Cantor A, PA-C  losartan (COZAAR) 50 MG tablet Take 50  mg by mouth in the morning and at bedtime.     [provider]  Magnesium  500 MG CAPS Take 1 capsule by mouth daily. Patient not taking: Reported on 11/25/2020    [provider]  meclizine  (ANTIVERT ) 25 MG tablet Take 1 tablet (25 mg total) by mouth 3 (three) times daily as needed for dizziness. 03/04/24   Triplett, Tammy, PA-C  metoprolol  tartrate (LOPRESSOR ) 25 MG tablet TAKE (1) TABLET BY MOUTH TWICE A DAY AS NEEDED. 01/20/21   Lavona Agent, MD  metoprolol  tartrate (LOPRESSOR ) 25 MG tablet Take 1 tablet (25 mg total) by mouth daily. 04/25/22   Fleming, Conner M, PA-C  OZEMPIC, 0.25 OR 0.5 MG/DOSE, 2 MG/1.5ML SOPN Inject 0.5 mg into the skin once a week.  01/09/20   [provider]  predniSONE  (DELTASONE ) 20 MG tablet 2 tablets Patient not taking: No sig reported 11/18/20   [provider]  pregabalin  (LYRICA ) 75 MG capsule Take 150 mg by mouth daily.    [provider]  Vitamin D , Ergocalciferol , (DRISDOL ) 50000 units CAPS capsule Take 50,000 Units by mouth every Friday.  Patient not taking: Reported on 11/25/2020    [provider]    Allergies: Amoxicillin and Fluconazole    Review of Systems  Updated Vital Signs BP (!) 194/76   Pulse (!) 121   Temp  99.3 F (37.4 C) (Oral)   Resp (!) 21   SpO2 95%   Physical Exam Vitals and nursing note reviewed.  Constitutional:      General: She is not in acute distress.    Appearance: She is well-developed.  HENT:     Head: Normocephalic and atraumatic.     Mouth/Throat:     Mouth: Mucous membranes are moist.  Eyes:     General: Vision grossly intact. Gaze aligned appropriately.     Extraocular Movements: Extraocular movements intact.     Conjunctiva/sclera: Conjunctivae normal.  Cardiovascular:     Rate and Rhythm: Regular rhythm. Tachycardia present.     Pulses: Normal pulses.     Heart sounds: Normal heart sounds, S1 normal and S2 normal. No murmur heard.    No friction rub. No  gallop.  Pulmonary:     Effort: Pulmonary effort is normal. No respiratory distress.     Breath sounds: Normal breath sounds.  Abdominal:     General: Bowel sounds are normal.     Palpations: Abdomen is soft.     Tenderness: There is no abdominal tenderness. There is no guarding or rebound.     Hernia: No hernia is present.  Musculoskeletal:        General: No swelling.     Cervical back: Full passive range of motion without pain, normal range of motion and neck supple. No spinous process tenderness or muscular tenderness. Normal range of motion.     Right lower leg: No edema.     Left lower leg: No edema.  Skin:    General: Skin is warm and dry.     Capillary Refill: Capillary refill takes less than 2 seconds.     Findings: No ecchymosis, erythema, rash or wound.  Neurological:     General: No focal deficit present.     Mental Status: She is alert and oriented to person, place, and time.     GCS: GCS eye subscore is 4. GCS verbal subscore is 5. GCS motor subscore is 6.     Cranial Nerves: Cranial nerves 2-12 are intact.     Sensory: Sensation is intact.     Motor: Motor function is intact.     Coordination: Coordination is intact.  Psychiatric:        Attention and Perception: Attention normal.        Mood and Affect: Mood normal.        Speech: Speech normal.        Behavior: Behavior normal.     (all labs ordered are listed, but only abnormal results are displayed) Labs Reviewed  RESP PANEL BY RT-PCR (RSV, FLU A&B, COVID)  RVPGX2 - Abnormal; Notable for the following components:      Result Value   Influenza A by PCR POSITIVE (*)    All other components within normal limits  CBC WITH DIFFERENTIAL/PLATELET - Abnormal; Notable for the following components:   MCH 25.8 (*)    Lymphs Abs 0.3 (*)    Monocytes Absolute 1.4 (*)    Abs Immature Granulocytes 0.09 (*)    All other components within normal limits  COMPREHENSIVE METABOLIC PANEL WITH GFR  LACTIC ACID, PLASMA   URINALYSIS, W/ REFLEX TO CULTURE (INFECTION SUSPECTED)    EKG: EKG Interpretation Date/Time:  Friday August 04 2024 01:32:17 EST Ventricular Rate:  129 PR Interval:  88 QRS Duration:  84 QT Interval:  309 QTC Calculation: 453 R Axis:   37  Text  Interpretation: Sinus tachycardia Consider right atrial enlargement Abnormal R-wave progression, early transition Nonspecific T abnormalities, lateral leads Confirmed by Haze Lonni PARAS [phone]) on 08/04/2024 1:40:13 AM  Radiology: [facility] Chest Port 1 View Result Date: 08/04/2024 EXAM: 1 VIEW(S) XRAY OF THE CHEST 08/04/2024 01:20:32 AM COMPARISON: 07/21/2023 CLINICAL HISTORY: cough FINDINGS: LUNGS AND PLEURA: No focal pulmonary opacity. No pleural effusion. No pneumothorax. HEART AND MEDIASTINUM: No acute abnormality of the cardiac and mediastinal silhouettes. BONES AND SOFT TISSUES: No acute osseous abnormality. IMPRESSION: 1. No acute cardiopulmonary process. Electronically signed by: Greig Pique MD 08/04/2024 01:37 AM EST RP Workstation: [id number]     Procedures   Medications Ordered in the ED  oseltamivir (TAMIFLU) capsule 75 mg (has no administration in time range)  lactated ringers  bolus 1,000 mL (1,000 mLs Intravenous New Bag/Given 08/04/24 0112)  acetaminophen  (TYLENOL ) tablet 650 mg (650 mg Oral Given 08/04/24 0218)  ibuprofen  (ADVIL ) tablet 400 mg (400 mg Oral Given 08/04/24 0218)                                    Medical Decision Making Amount and/or Complexity of Data Reviewed External Data Reviewed: labs, ECG and notes. Labs: ordered. Decision-making details documented in ED Course. Radiology: ordered and independent interpretation performed. Decision-making details documented in ED Course. ECG/medicine tests: ordered and independent interpretation performed. Decision-making details documented in ED Course.  Risk OTC drugs. Prescription drug management.   Differential Diagnosis considered includes, but not limited  to: COVID-19; influenza; RSV; simple viral URI; strep pharyngitis; pneumonia  Presents to the emergency department for evaluation of generalized weakness associated with upper respiratory infection symptoms.  Patient had a fall tonight because she became weak while trying to get to the bathroom.  She denies injury.  Patient noted to be tachycardic on arrival, sinus tachycardia.  Patient's workup has revealed influenza A, otherwise unremarkable.  This is the cause of her weakness, URI symptoms and sinus tachycardia.  Administered IV fluids, antipyretics.  Will be appropriate for discharge.     Final diagnoses:  Influenza A    ED Discharge Orders          Ordered    oseltamivir (TAMIFLU) 75 MG capsule  Every 12 hours        08/04/24 0307               Haze Lonni PARAS, MD 08/04/24 [phone]  "

## 2024-08-04 NOTE — ED Triage Notes (Signed)
 Pt presents with flu-like symptoms. Been around family members with flu. Pt fell in hallway tonight, no injuries. Chronic bilateral knee pain.

## 2024-09-02 ENCOUNTER — Encounter (HOSPITAL_COMMUNITY): Payer: Self-pay

## 2024-09-02 ENCOUNTER — Emergency Department (HOSPITAL_COMMUNITY)
Admission: EM | Admit: 2024-09-02 | Discharge: 2024-09-02 | Disposition: A | Discharge status: Home / Self Care | Attending: Emergency Medicine | Admitting: Emergency Medicine

## 2024-09-02 ENCOUNTER — Emergency Department (HOSPITAL_COMMUNITY)

## 2024-09-02 ENCOUNTER — Other Ambulatory Visit: Payer: Self-pay

## 2024-09-02 DIAGNOSIS — Z794 Long term (current) use of insulin: Secondary | ICD-10-CM | POA: Diagnosis not present

## 2024-09-02 DIAGNOSIS — Z7982 Long term (current) use of aspirin: Secondary | ICD-10-CM | POA: Insufficient documentation

## 2024-09-02 DIAGNOSIS — R42 Dizziness and giddiness: Secondary | ICD-10-CM | POA: Diagnosis present

## 2024-09-02 DIAGNOSIS — R112 Nausea with vomiting, unspecified: Secondary | ICD-10-CM | POA: Insufficient documentation

## 2024-09-02 HISTORY — DX: Dizziness and giddiness: R42

## 2024-09-02 LAB — CBC WITH DIFFERENTIAL/PLATELET
Abs Immature Granulocytes: 0.07 10*3/uL (ref 0.00–0.07)
Basophils Absolute: 0.1 10*3/uL (ref 0.0–0.1)
Basophils Relative: 1 %
Eosinophils Absolute: 0.3 10*3/uL (ref 0.0–0.5)
Eosinophils Relative: 3 %
HCT: 39.7 % (ref 36.0–46.0)
Hemoglobin: 12.2 g/dL (ref 12.0–15.0)
Immature Granulocytes: 1 %
Lymphocytes Relative: 16 %
Lymphs Abs: 1.7 10*3/uL (ref 0.7–4.0)
MCH: 25.6 pg — ABNORMAL LOW (ref 26.0–34.0)
MCHC: 30.7 g/dL (ref 30.0–36.0)
MCV: 83.2 fL (ref 80.0–100.0)
Monocytes Absolute: 1 10*3/uL (ref 0.1–1.0)
Monocytes Relative: 9 %
Neutro Abs: 7.8 10*3/uL — ABNORMAL HIGH (ref 1.7–7.7)
Neutrophils Relative %: 70 %
Platelets: 274 10*3/uL (ref 150–400)
RBC: 4.77 MIL/uL (ref 3.87–5.11)
RDW: 15.7 % — ABNORMAL HIGH (ref 11.5–15.5)
WBC: 11 10*3/uL — ABNORMAL HIGH (ref 4.0–10.5)
nRBC: 0 % (ref 0.0–0.2)

## 2024-09-02 LAB — TROPONIN T, HIGH SENSITIVITY
Troponin T High Sensitivity: 6 ng/L (ref 0–19)
Troponin T High Sensitivity: 6 ng/L (ref 0–19)

## 2024-09-02 LAB — COMPREHENSIVE METABOLIC PANEL WITH GFR
ALT: 12 U/L (ref 0–44)
AST: 17 U/L (ref 15–41)
Albumin: 3.6 g/dL (ref 3.5–5.0)
Alkaline Phosphatase: 112 U/L (ref 38–126)
Anion gap: 9 (ref 5–15)
BUN: 21 mg/dL (ref 8–23)
CO2: 25 mmol/L (ref 22–32)
Calcium: 9.1 mg/dL (ref 8.9–10.3)
Chloride: 107 mmol/L (ref 98–111)
Creatinine, Ser: 0.73 mg/dL (ref 0.44–1.00)
GFR, Estimated: >60 mL/min
Glucose, Bld: 99 mg/dL (ref 70–99)
Potassium: 4.1 mmol/L (ref 3.5–5.1)
Sodium: 142 mmol/L (ref 135–145)
Total Bilirubin: 0.8 mg/dL (ref 0.0–1.2)
Total Protein: 6.8 g/dL (ref 6.5–8.1)

## 2024-09-02 MED ORDER — MECLIZINE HCL 12.5 MG PO TABS
25.0000 mg | ORAL_TABLET | Freq: Once | ORAL | Status: AC
  Administered 2024-09-02: 25 mg via ORAL
  Filled 2024-09-02: qty 2

## 2024-09-02 MED ORDER — ONDANSETRON 4 MG PO TBDP: ORAL_TABLET | ORAL | 0 refills | Status: AC

## 2024-09-02 MED ORDER — ONDANSETRON HCL 4 MG/2ML IJ SOLN
4.0000 mg | Freq: Once | INTRAMUSCULAR | Status: AC
  Administered 2024-09-02: 4 mg via INTRAVENOUS
  Filled 2024-09-02: qty 2

## 2024-09-02 MED ORDER — SODIUM CHLORIDE 0.9 % IV BOLUS
1000.0000 mL | Freq: Once | INTRAVENOUS | Status: AC
  Administered 2024-09-02: 1000 mL via INTRAVENOUS

## 2024-09-02 NOTE — ED Triage Notes (Signed)
 Pt arrived via [facility] from home c/o dizziness, nausea, emesis and palpitations that began last night around 2100. Pt reports symptoms were triggered when she began to worry about driving home on icy roads in the snow. Pt reports taking 4 baby ASA PTA with minimal relief.

## 2024-09-02 NOTE — ED Provider Notes (Signed)
 Patient improved with meclizine  and will be discharged home to take meclizine  and Zofran    Suzette Pac, MD 09/02/24 [phone]

## 2024-09-02 NOTE — ED Provider Notes (Signed)
 " [place] EMERGENCY DEPARTMENT AT [facility] Provider Note   CSN: [id number] Arrival date & time: 09/02/24  0534     Patient presents with: Dizziness   Cathy Thomas is a 69 y.o. female.  {Add pertinent medical, surgical, social history, OB history to YEP:67052} Patient is a 69 year old female presenting with complaints of dizziness.  She describes a spinning sensation that started acutely last night.  She reports being stressed out related to the recent snow and believes this may have caused her symptoms.  She feels off balance and dizzy.  She also feels nauseated and has vomited.  She has had episodes of vertigo in the past and this feels similar.  She denies any headache, visual disturbances, weakness, or numbness.       Prior to Admission medications  Medication Sig Start Date End Date Taking? Authorizing Provider  acetaminophen  (TYLENOL ) 325 MG tablet Take 2 tablets (650 mg total) by mouth every 4 (four) hours as needed for mild pain (or temp > 37.5 C (99.5 F)). 02/09/20   Pearlean Manus, MD  aspirin  EC 81 MG tablet Take 1 tablet (81 mg total) by mouth daily with breakfast. Please take Aspirin  81 mg daily along with Plavix  75 mg daily for 30 days then after that STOP the aspirin  and continue ONLY Plavix  75 mg daily indefinitely--for stroke prevention 02/09/20   Pearlean Manus, MD  atorvastatin  (LIPITOR) 40 MG tablet Take 1 tablet (40 mg total) by mouth daily. Patient not taking: Reported on 11/25/2020 02/09/20   Pearlean Manus, MD  ibuprofen  (ADVIL ) 600 MG tablet Take 1 tablet (600 mg total) by mouth every 6 (six) hours as needed. 07/21/23   Beatty, Celeste A, PA-C  lidocaine  (LIDODERM ) 5 % Place 1 patch onto the skin daily. Remove & Discard patch within 12 hours or as directed by MD 07/21/23   Suellen Cantor A, PA-C  losartan (COZAAR) 50 MG tablet Take 50 mg by mouth in the morning and at bedtime.     [provider]  Magnesium  500 MG CAPS Take 1 capsule by  mouth daily. Patient not taking: Reported on 11/25/2020    [provider]  meclizine  (ANTIVERT ) 25 MG tablet Take 1 tablet (25 mg total) by mouth 3 (three) times daily as needed for dizziness. 03/04/24   Triplett, Tammy, PA-C  metoprolol  tartrate (LOPRESSOR ) 25 MG tablet TAKE (1) TABLET BY MOUTH TWICE A DAY AS NEEDED. 01/20/21   Lavona Agent, MD  metoprolol  tartrate (LOPRESSOR ) 25 MG tablet Take 1 tablet (25 mg total) by mouth daily. 04/25/22   Theotis Peers M, PA-C  oseltamivir  (TAMIFLU ) 75 MG capsule Take 1 capsule (75 mg total) by mouth every 12 (twelve) hours. 08/04/24   Pollina, Lonni PARAS, MD  OZEMPIC, 0.25 OR 0.5 MG/DOSE, 2 MG/1.5ML SOPN Inject 0.5 mg into the skin once a week.  01/09/20   [provider]  predniSONE  (DELTASONE ) 20 MG tablet 2 tablets Patient not taking: No sig reported 11/18/20   [provider]  pregabalin  (LYRICA ) 75 MG capsule Take 150 mg by mouth daily.    [provider]  Vitamin D , Ergocalciferol , (DRISDOL ) 50000 units CAPS capsule Take 50,000 Units by mouth every Friday.  Patient not taking: Reported on 11/25/2020    [provider]    Allergies: Amoxicillin and Fluconazole    Review of Systems  All other systems reviewed and are negative.   Updated Vital Signs BP (!) 158/81   Pulse 81   Temp  97.6 F (36.4 C) (Oral)   Resp 14   Ht 5' 7 (1.702 m)   Wt 113.4 kg   SpO2 98%   BMI 39.16 kg/m   Physical Exam Vitals and nursing note reviewed.  Constitutional:      General: She is not in acute distress.    Appearance: She is well-developed. She is not diaphoretic.  HENT:     Head: Normocephalic and atraumatic.  Eyes:     Extraocular Movements: Extraocular movements intact.     Pupils: Pupils are equal, round, and reactive to light.  Cardiovascular:     Rate and Rhythm: Normal rate and regular rhythm.     Heart sounds: No murmur heard.    No friction rub. No gallop.  Pulmonary:     Effort: Pulmonary  effort is normal. No respiratory distress.     Breath sounds: Normal breath sounds. No wheezing.  Abdominal:     General: Bowel sounds are normal. There is no distension.     Palpations: Abdomen is soft.     Tenderness: There is no abdominal tenderness.  Musculoskeletal:        General: Normal range of motion.     Cervical back: Normal range of motion and neck supple.  Skin:    General: Skin is warm and dry.  Neurological:     General: No focal deficit present.     Mental Status: She is alert and oriented to person, place, and time.     Cranial Nerves: No cranial nerve deficit.     Motor: No weakness.     (all labs ordered are listed, but only abnormal results are displayed) Labs Reviewed  CBC WITH DIFFERENTIAL/PLATELET - Abnormal; Notable for the following components:      Result Value   WBC 11.0 (*)    MCH 25.6 (*)    RDW 15.7 (*)    Neutro Abs 7.8 (*)    All other components within normal limits  COMPREHENSIVE METABOLIC PANEL WITH GFR  TROPONIN T, HIGH SENSITIVITY    EKG: EKG Interpretation Date/Time:  Saturday September 02 2024 05:44:35 EST Ventricular Rate:  76 PR Interval:  149 QRS Duration:  99 QT Interval:  443 QTC Calculation: 499 R Axis:   38  Text Interpretation: Sinus rhythm Borderline prolonged QT interval Confirmed by Geroldine Berg ([id number]) on 09/02/2024 5:48:22 AM  Radiology: No results found.  {Document cardiac monitor, telemetry assessment procedure when appropriate:32947} Procedures   Medications Ordered in the ED  sodium chloride  0.9 % bolus 1,000 mL (has no administration in time range)  meclizine  (ANTIVERT ) tablet 25 mg (has no administration in time range)  ondansetron  (ZOFRAN ) injection 4 mg (has no administration in time range)      {Click here for ABCD2, HEART and other calculators REFRESH Note before signing:1}                              Medical Decision Making Amount and/or Complexity of Data Reviewed Labs: ordered. Radiology:  ordered.  Risk Prescription drug management.   ***  {Document critical care time when appropriate  Document review of labs and clinical decision tools ie CHADS2VASC2, etc  Document your independent review of radiology images and any outside records  Document your discussion with family members, caretakers and with consultants  Document social determinants of health affecting pt's care  Document your decision making why or why not admission, treatments were needed:32947:::1}   Final  diagnoses:  None    ED Discharge Orders     None        "

## 2024-09-02 NOTE — Discharge Instructions (Signed)
 Use your meclizine  for your dizziness and follow-up with your doctor as needed

## 2024-09-02 NOTE — ED Notes (Signed)
 Patient transported to CT
